# Patient Record
Sex: Female | Born: 1940
Health system: Southern US, Community
[De-identification: ages and names within clinical notes are randomized; demographics above are authoritative.]

## PROBLEM LIST (undated history)

## (undated) DIAGNOSIS — F32A Depression, unspecified: Secondary | ICD-10-CM

## (undated) DIAGNOSIS — E039 Hypothyroidism, unspecified: Secondary | ICD-10-CM

## (undated) DIAGNOSIS — E079 Disorder of thyroid, unspecified: Secondary | ICD-10-CM

## (undated) DIAGNOSIS — E785 Hyperlipidemia, unspecified: Secondary | ICD-10-CM

## (undated) DIAGNOSIS — R269 Unspecified abnormalities of gait and mobility: Secondary | ICD-10-CM

## (undated) DIAGNOSIS — N393 Stress incontinence (female) (male): Secondary | ICD-10-CM

## (undated) DIAGNOSIS — I519 Heart disease, unspecified: Secondary | ICD-10-CM

## (undated) DIAGNOSIS — M1610 Unilateral primary osteoarthritis, unspecified hip: Secondary | ICD-10-CM

## (undated) DIAGNOSIS — G43B0 Ophthalmoplegic migraine, not intractable: Secondary | ICD-10-CM

## (undated) DIAGNOSIS — I4891 Unspecified atrial fibrillation: Secondary | ICD-10-CM

## (undated) DIAGNOSIS — M1990 Unspecified osteoarthritis, unspecified site: Secondary | ICD-10-CM

## (undated) HISTORY — DX: Stress incontinence (female) (male): N39.3

## (undated) HISTORY — DX: Disorder of thyroid, unspecified: E07.9

## (undated) HISTORY — DX: Heart disease, unspecified: I51.9

## (undated) HISTORY — PX: BREAST BIOPSY: SHX20

## (undated) HISTORY — DX: Depression, unspecified: F32.A

## (undated) HISTORY — DX: Unilateral primary osteoarthritis, unspecified hip: M16.10

## (undated) HISTORY — DX: Unspecified abnormalities of gait and mobility: R26.9

## (undated) HISTORY — PX: HIP SURGERY: SHX245

## (undated) HISTORY — DX: Hyperlipidemia, unspecified: E78.5

## (undated) HISTORY — DX: Unspecified atrial fibrillation: I48.91

## (undated) HISTORY — PX: TONSILLECTOMY AND ADENOIDECTOMY: SHX28

## (undated) HISTORY — PX: BACK SURGERY: SHX140

## (undated) HISTORY — PX: KNEE SURGERY: SHX244

## (undated) HISTORY — DX: Hypothyroidism, unspecified: E03.9

## (undated) HISTORY — PX: OOPHORECTOMY: SHX86

## (undated) HISTORY — DX: Ophthalmoplegic migraine, not intractable: G43.B0

## (undated) HISTORY — DX: Unspecified osteoarthritis, unspecified site: M19.90

---

## 2020-12-05 ENCOUNTER — Other Ambulatory Visit: Payer: Self-pay

## 2020-12-05 ENCOUNTER — Ambulatory Visit (INDEPENDENT_AMBULATORY_CARE_PROVIDER_SITE_OTHER): Payer: Medicare Other | Admitting: Family Medicine

## 2020-12-05 ENCOUNTER — Encounter: Payer: Self-pay | Admitting: Family Medicine

## 2020-12-05 ENCOUNTER — Ambulatory Visit (INDEPENDENT_AMBULATORY_CARE_PROVIDER_SITE_OTHER): Payer: Medicare Other

## 2020-12-05 VITALS — BP 100/72 | HR 55 | Temp 97.4°F | Ht 65.0 in | Wt 193.0 lb

## 2020-12-05 DIAGNOSIS — N393 Stress incontinence (female) (male): Secondary | ICD-10-CM

## 2020-12-05 DIAGNOSIS — Z7901 Long term (current) use of anticoagulants: Secondary | ICD-10-CM

## 2020-12-05 DIAGNOSIS — F32A Depression, unspecified: Secondary | ICD-10-CM

## 2020-12-05 DIAGNOSIS — I4891 Unspecified atrial fibrillation: Secondary | ICD-10-CM

## 2020-12-05 DIAGNOSIS — Z7189 Other specified counseling: Secondary | ICD-10-CM

## 2020-12-05 DIAGNOSIS — R413 Other amnesia: Secondary | ICD-10-CM

## 2020-12-05 DIAGNOSIS — E785 Hyperlipidemia, unspecified: Secondary | ICD-10-CM

## 2020-12-05 DIAGNOSIS — R269 Unspecified abnormalities of gait and mobility: Secondary | ICD-10-CM

## 2020-12-05 DIAGNOSIS — E039 Hypothyroidism, unspecified: Secondary | ICD-10-CM

## 2020-12-05 LAB — POCT INR: INR: 1.6 — AB (ref 2.0–3.0)

## 2020-12-05 MED ORDER — ESTRING 2 MG VA RING: 2.0000 mg | VAGINAL_RING | VAGINAL | Status: DC

## 2020-12-05 NOTE — Patient Instructions (Addendum)
Go to the front and ask to get your INR checked.   I'll look back through your records and get your referrals set up.  We'll see about follow up blood tests. Take care.  Glad to see you.

## 2020-12-05 NOTE — Progress Notes (Signed)
This visit occurred during the SARS-CoV-2 public health emergency.  Safety protocols were in place, including screening questions prior to the visit, additional usage of staff PPE, and extensive cleaning of exam room while observing appropriate contact time as indicated for disinfecting solutions.  New patient.  Moved down from [place] last month, getting set up locally.   History of depression and she will to get set up with counseling locally.    History of A. fib after surgery.  It sounds like she had developed CHF there afterwards.  She never had a bypass or stent.  She is anticoagulated and needs INR done today.   She needs to get set up with cardiology locally.  Still on metoprolol at baseline.  Already on Lipitor and Lasix.  She had history of handwriting change and also memory changes after previous surgery.  She has had some headaches and also balance changes.  Per patient and family, she was "not the same" after back surgery in August 2020.  She is using a walker now.  She last fell in December 2021.  Her balance changes got worse after surgery but they did predate her surgery, though not at the current level.  She does not have a foot drop.  Daughter was asking about getting her tested for Lyme disease.  She is used Estring 4 times a year.  It helped initially but she still has stress urinary incontinence.  Hypothyroidism on replacement.  Will review old records.  Compliant with medication.  Daughter [name] designated if patient were incapacitated.  Meds, vitals, and allergies reviewed.   ROS: Per HPI unless specifically indicated in ROS section   GEN: nad, alert and oriented HEENT: ncat NECK: supple w/o LA CV: rrr.  PULM: ctab, no inc wob ABD: soft, +bs EXT: Trace BLE edema SKIN: no acute rash She has notably slow handwriting when signing her name at the office visit.  She acknowledges this.  45 minutes were devoted to patient care in this encounter (this includes time spent  reviewing the patient's file/history, interviewing and examining the patient, counseling/reviewing plan with patient).

## 2020-12-05 NOTE — Patient Instructions (Addendum)
Pre visit review using our clinic review tool, if applicable. No additional management support is needed unless otherwise documented below in the visit note.  Increase dose to take 4 mg today and tomorrow and then continue weekly dose of 2 mg everyday EXCEPT Sundays and Thursdays. Re-check in 2 weeks.

## 2020-12-07 NOTE — Progress Notes (Signed)
Agree. Thanks

## 2020-12-08 ENCOUNTER — Telehealth: Payer: Self-pay | Admitting: Family Medicine

## 2020-12-08 ENCOUNTER — Encounter: Payer: Self-pay | Admitting: Family Medicine

## 2020-12-08 DIAGNOSIS — E785 Hyperlipidemia, unspecified: Secondary | ICD-10-CM | POA: Insufficient documentation

## 2020-12-08 DIAGNOSIS — R32 Unspecified urinary incontinence: Secondary | ICD-10-CM | POA: Insufficient documentation

## 2020-12-08 DIAGNOSIS — R413 Other amnesia: Secondary | ICD-10-CM | POA: Insufficient documentation

## 2020-12-08 DIAGNOSIS — I4891 Unspecified atrial fibrillation: Secondary | ICD-10-CM | POA: Insufficient documentation

## 2020-12-08 DIAGNOSIS — N393 Stress incontinence (female) (male): Secondary | ICD-10-CM | POA: Insufficient documentation

## 2020-12-08 DIAGNOSIS — E039 Hypothyroidism, unspecified: Secondary | ICD-10-CM | POA: Insufficient documentation

## 2020-12-08 DIAGNOSIS — R269 Unspecified abnormalities of gait and mobility: Secondary | ICD-10-CM | POA: Insufficient documentation

## 2020-12-08 DIAGNOSIS — Z7189 Other specified counseling: Secondary | ICD-10-CM | POA: Insufficient documentation

## 2020-12-08 DIAGNOSIS — F32A Depression, unspecified: Secondary | ICD-10-CM | POA: Insufficient documentation

## 2020-12-08 MED ORDER — METOPROLOL SUCCINATE ER 50 MG PO TB24: 50.0000 mg | ORAL_TABLET | Freq: Every day | ORAL | Status: DC

## 2020-12-08 NOTE — Assessment & Plan Note (Signed)
I think it would likely make sense for the patient to see urology and I went ahead and put in the referral.

## 2020-12-08 NOTE — Assessment & Plan Note (Signed)
Noted to be worse after previous back surgery.  She does not have a focal weakness today.  She does not have a foot drop.  She does have a slowing of handwriting but not specific unilateral weakness.  I will review old records.

## 2020-12-08 NOTE — Assessment & Plan Note (Signed)
Refer for counseling.

## 2020-12-08 NOTE — Telephone Encounter (Signed)
She states that she also has a allergy to  Macrobid  The best anesthesia for her is dilaudid

## 2020-12-08 NOTE — Assessment & Plan Note (Signed)
Continue levothyroxine 

## 2020-12-08 NOTE — Assessment & Plan Note (Signed)
Unclear source.  I want to review outside records.  Daughter was asking about Lyme testing.  We opted to defer any further labs until I could review her old records.

## 2020-12-08 NOTE — Assessment & Plan Note (Signed)
Daughter [name] designated if patient were incapacitated. 

## 2020-12-08 NOTE — Assessment & Plan Note (Signed)
INR done today.  Continue Coumadin.  No change in meds at this point.  I will review outside records.  Refer to cardiology.

## 2020-12-08 NOTE — Assessment & Plan Note (Signed)
-  Continue Lipitor °

## 2020-12-10 NOTE — Telephone Encounter (Signed)
Chart updated. Thanks!

## 2020-12-16 DIAGNOSIS — H52223 Regular astigmatism, bilateral: Secondary | ICD-10-CM | POA: Diagnosis not present

## 2020-12-16 DIAGNOSIS — H2513 Age-related nuclear cataract, bilateral: Secondary | ICD-10-CM | POA: Diagnosis not present

## 2020-12-16 DIAGNOSIS — H04129 Dry eye syndrome of unspecified lacrimal gland: Secondary | ICD-10-CM | POA: Diagnosis not present

## 2020-12-16 DIAGNOSIS — H5203 Hypermetropia, bilateral: Secondary | ICD-10-CM | POA: Diagnosis not present

## 2020-12-18 ENCOUNTER — Ambulatory Visit (INDEPENDENT_AMBULATORY_CARE_PROVIDER_SITE_OTHER): Payer: Medicare Other

## 2020-12-18 ENCOUNTER — Other Ambulatory Visit: Payer: Self-pay

## 2020-12-18 ENCOUNTER — Telehealth: Payer: Self-pay | Admitting: Family Medicine

## 2020-12-18 DIAGNOSIS — Z7901 Long term (current) use of anticoagulants: Secondary | ICD-10-CM

## 2020-12-18 LAB — POCT INR: INR: 1.5 — AB (ref 2.0–3.0)

## 2020-12-18 NOTE — Telephone Encounter (Signed)
Patient came in drop off Power of [place] paper work . It was place in mail tower

## 2020-12-18 NOTE — Telephone Encounter (Signed)
Noted. Thanks.

## 2020-12-18 NOTE — Progress Notes (Signed)
Agree. Thanks

## 2020-12-18 NOTE — Patient Instructions (Addendum)
Pre visit review using our clinic review tool, if applicable. No additional management support is needed unless otherwise documented below in the visit note.   Increase dose to take  8 mg today and increase dose tomorrow to take 3 mg and then change weekly dose of 2 mg everyday EXCEPT take 4 mg on Mon, Wed and Fri.  Re-check in 1 weeks.

## 2020-12-23 ENCOUNTER — Other Ambulatory Visit: Payer: Self-pay

## 2020-12-23 ENCOUNTER — Encounter: Payer: Self-pay | Admitting: Interventional Cardiology

## 2020-12-23 ENCOUNTER — Ambulatory Visit: Payer: Medicare Other | Admitting: Interventional Cardiology

## 2020-12-23 VITALS — BP 136/80 | HR 44 | Ht 65.0 in | Wt 194.0 lb

## 2020-12-23 DIAGNOSIS — I480 Paroxysmal atrial fibrillation: Secondary | ICD-10-CM

## 2020-12-23 DIAGNOSIS — I5032 Chronic diastolic (congestive) heart failure: Secondary | ICD-10-CM | POA: Diagnosis not present

## 2020-12-23 DIAGNOSIS — Z7901 Long term (current) use of anticoagulants: Secondary | ICD-10-CM

## 2020-12-23 DIAGNOSIS — R001 Bradycardia, unspecified: Secondary | ICD-10-CM

## 2020-12-23 MED ORDER — FUROSEMIDE 20 MG PO TABS: 20.0000 mg | ORAL_TABLET | Freq: Every day | ORAL | 2 refills | Status: DC

## 2020-12-23 MED ORDER — METOPROLOL SUCCINATE ER 25 MG PO TB24: 25.0000 mg | ORAL_TABLET | Freq: Every day | ORAL | 2 refills | Status: DC

## 2020-12-23 NOTE — Patient Instructions (Signed)
Medication Instructions:  Your physician has recommended you make the following change in your medication: Decrease metoprolol succinate to 25 mg by mouth daily  *If you need a refill on your cardiac medications before your next appointment, please call your pharmacy*   Lab Work: none If you have labs (blood work) drawn today and your tests are completely normal, you will receive your results only by: Marland Kitchen MyChart Message (if you have MyChart) OR . A paper copy in the mail If you have any lab test that is abnormal or we need to change your treatment, we will call you to review the results.   Testing/Procedures: none   Follow-Up: At [facility], you and your health needs are our priority.  As part of our continuing mission to provide you with exceptional heart care, we have created designated Provider Care Teams.  These Care Teams include your primary Cardiologist (physician) and Advanced Practice Providers (APPs -  Physician Assistants and Nurse Practitioners) who all work together to provide you with the care you need, when you need it.  We recommend signing up for the patient portal called "MyChart".  Sign up information is provided on this After Visit Summary.  MyChart is used to connect with patients for Virtual Visits (Telemedicine).  Patients are able to view lab/test results, encounter notes, upcoming appointments, etc.  Non-urgent messages can be sent to your provider as well.   To learn more about what you can do with MyChart, go to [email or web address].    Your next appointment:   6 month(s)  The format for your next appointment:   In Person  Provider:   You may see Lance Muss, MD or one of the following Advanced Practice Providers on your designated Care Team:    Ronie Spies, PA-C  Jacolyn Reedy, PA-C    Other Instructions

## 2020-12-23 NOTE — Progress Notes (Signed)
Cardiology Office Note   Date:  12/23/2020   ID:  [name], [name] [birth year], MRN [id number]  PCP:  Mallory Nam, MD    No chief complaint on file.  AFib  Wt Readings from Last 3 Encounters:  12/23/20 194 lb (88 kg)  12/05/20 193 lb (87.5 kg)       History of Present Illness: Mallory Estrada is a 80 y.o. female who is being seen today for the evaluation of AFib at the request of Mallory Nam, MD.  Per Dr. Para Estrada: "New patient.  Moved down from [place] in Jan 2022, getting set up locally.   History of depression and she will to get set up with counseling locally.    History of A. fib after surgery.  It sounds like she had developed CHF there afterwards.  She never had a bypass or stent.  She is anticoagulated and needs INR done today.   She needs to get set up with cardiology locally.  Still on metoprolol at baseline.  Already on Lipitor and Lasix."  She reports her memory has decreased since the back surgery in 2020.   AFib started after spinal surgery in 06/2019.  She has been on Coumadin since that time.  It had been stable until her move.  She is followed by Dr. Para Estrada.   No bleeding problems. She did not feel sx with AFib in the past.   Denies : Chest pain. Dizziness. Leg edema. Nitroglycerin use. Orthopnea. Palpitations. Paroxysmal nocturnal dyspnea. Shortness of breath. Syncope.   Past Medical History:  Diagnosis Date  . Arthritis   . Atrial fibrillation (HCC)    Noted postop.  . Depression   . Gait abnormality   . Hyperlipidemia   . Hypothyroidism   . Ophthalmoplegic migraine   . SUI (stress urinary incontinence, female)     Past Surgical History:  Procedure Laterality Date  . BACK SURGERY    . BREAST BIOPSY    . HIP SURGERY Bilateral   . KNEE SURGERY Bilateral   . OOPHORECTOMY Left   . TONSILLECTOMY AND ADENOIDECTOMY       Current Outpatient Medications  Medication Sig Dispense Refill  . atorvastatin (LIPITOR) 10 MG tablet Take 10 mg  by mouth daily.    . Cholecalciferol (VITAMIN D3) 125 MCG (5000 UT) CAPS Take by mouth.    . estradiol (ESTRING) 2 MG vaginal ring Place 2 mg vaginally every 3 (three) months. follow package directions    . furosemide (LASIX) 20 MG tablet Take 20 mg by mouth.    . levothyroxine (SYNTHROID) 50 MCG tablet Take 50 mcg by mouth daily before breakfast.    . magnesium gluconate (MAGONATE) 500 MG tablet Take 500 mg by mouth 2 (two) times daily.    . Melatonin 1 MG CHEW Chew by mouth.    . metoprolol succinate (TOPROL-XL) 50 MG 24 hr tablet Take 1 tablet (50 mg total) by mouth daily. Take with or immediately following a meal.    . potassium chloride (KLOR-CON) 20 MEQ packet Take 20 mEq by mouth daily.     No current facility-administered medications for this visit.    Allergies:   Fentanyl, Ketamine, Macrobid [nitrofurantoin], and Sulfa antibiotics    Social History:  The patient  reports that she quit smoking about 50 years ago. She has never used smokeless tobacco. She reports current alcohol use.   Family History:  The patient's family history includes Arthritis in her father, mother, and sister; Breast  cancer in her maternal aunt; Colon cancer in her brother; Diabetes in her father; Heart attack in her brother; Heart disease in her brother and father.    ROS:  Please see the history of present illness.   Otherwise, review of systems are positive for memory decline.   All other systems are reviewed and negative.    PHYSICAL EXAM: VS:  BP 136/80   Pulse (!) 44   Ht 5\' 5"  (1.651 m)   Wt 194 lb (88 kg)   SpO2 95%   BMI 32.28 kg/m  , BMI Body mass index is 32.28 kg/m. GEN: Well nourished, well developed, in no acute distress  HEENT: normal  Neck: no JVD, carotid bruits, or masses Cardiac: bradycardia; no murmurs, rubs, or gallops,no edema  Respiratory:  clear to auscultation bilaterally, normal work of breathing GI: soft, nontender, nondistended, + BS MS: no deformity or atrophy  Skin:  warm and dry, no rash Neuro:  Strength and sensation are intact Psych: euthymic mood, full affect   EKG:   The ekg ordered today demonstrates marked sinus bradycardia   Recent Labs: No results found for requested labs within last 8760 hours.   Lipid Panel No results found for: CHOL, TRIG, HDL, CHOLHDL, VLDL, LDLCALC, LDLDIRECT   Other studies Reviewed: Additional studies/ records that were reviewed today with results demonstrating: records from Dr. reviewed   ASSESSMENT AND PLAN:  1. PAF: In Sinus bradycardia. Did not feel her AFib in the past.  2. Anticoagulated: Coumadin for stroke prevention.  3. Bradycardia: Decrease metoprolol to 25 mg daily.  Will see if HR and BP can be checked at Coumadin visit.  4. Mallory Estrada diastolic heart failure which has been managed with Lasix. She had an echo in [id number]. Appears euvolemic.  Await records from [place].   Current medicines are reviewed at length with the patient today.  The patient concerns regarding her medicines were addressed.  The following changes have been made:  No change  Labs/ tests ordered today include:  No orders of the defined types were placed in this encounter.   Recommend 150 minutes/week of aerobic exercise Low fat, low carb, high fiber diet recommended  Disposition:   FU in 6 months   Signed, [place], MD  12/23/2020 10:58 AM    [facility] [address] [place], [place], [place]  [place] Phone: [phone]; Fax: [phone]

## 2020-12-25 ENCOUNTER — Telehealth: Payer: Self-pay | Admitting: Interventional Cardiology

## 2020-12-25 ENCOUNTER — Ambulatory Visit: Payer: Medicare Other

## 2020-12-25 ENCOUNTER — Telehealth: Payer: Self-pay

## 2020-12-25 NOTE — Telephone Encounter (Signed)
Patients daughter called and stated that they were unable to make it to coumadin clinic appointment today because patient had a fall last night. Patients daughter states that her mother has no injuries, but reports that her mother is "tired and shaken up." Patients daughter states that it is becoming more difficult for her mother to make it to doctors appointment because she has so many. Daughter stated that she had been researching home INR checks. Patients daughter is interested in possibly starting to monitor her mothers INR at home.

## 2020-12-25 NOTE — Telephone Encounter (Signed)
Sent record request to Dr. Lonna Cobb at [facility] on 12/24/20. Received records on 12/25/20 and placed them in Dr. Hoyle Barr [facility] 12/25/20

## 2020-12-26 NOTE — Telephone Encounter (Signed)
Pts daughter called and advised pt's insurance will pay for INR machine and pt does meet parameters for them to pay for it. Advised an order would be faxed and if they were not contacted within 2 weeks by the company to contact this office. [name] verbalized understanding.

## 2020-12-26 NOTE — Telephone Encounter (Signed)
Please talk to me about this situation and potential INR monitoring at home.

## 2020-12-26 NOTE — Telephone Encounter (Signed)
Called and spoke with patients daughter in regards to INR testing at home. Informed patients daughter that the patient or a family member would have to be the one checking it. Patients daughter verbalized understanding. Instructed patients daughter to call insurance company to see if they would cover the INR monitor. Also, INR check scheduled for this Tuesday (3/1) since patient missed last INR check. Instructed patients daughter to continue current dosing to take 1 tablet everyday except take 2 tablets on Monday, Wednesday and Thursday. Patients daughter verbalized understanding and stated that she would call the insurance company to find out information regarding home INR monitor.

## 2020-12-29 ENCOUNTER — Encounter: Payer: Self-pay | Admitting: Family Medicine

## 2020-12-29 DIAGNOSIS — Z531 Procedure and treatment not carried out because of patient's decision for reasons of belief and group pressure: Secondary | ICD-10-CM

## 2020-12-29 DIAGNOSIS — IMO0001 Reserved for inherently not codable concepts without codable children: Secondary | ICD-10-CM

## 2020-12-29 HISTORY — DX: Reserved for inherently not codable concepts without codable children: IMO0001

## 2020-12-29 HISTORY — DX: Procedure and treatment not carried out because of patient's decision for reasons of belief and group pressure: Z53.1

## 2020-12-29 NOTE — Telephone Encounter (Signed)
Order is being filled out by [employer], Charity fundraiser

## 2020-12-29 NOTE — Telephone Encounter (Signed)
Do I need to send an order for the INR meter?  Please advise patient that I am awaiting the rest of the outside records and I'll work on that as they come in.  We can consider Lyme testing/memory work up at that point.  Thanks.

## 2020-12-30 ENCOUNTER — Other Ambulatory Visit: Payer: Self-pay

## 2020-12-30 ENCOUNTER — Telehealth: Payer: Self-pay | Admitting: Family Medicine

## 2020-12-30 ENCOUNTER — Ambulatory Visit (INDEPENDENT_AMBULATORY_CARE_PROVIDER_SITE_OTHER): Payer: Medicare Other

## 2020-12-30 DIAGNOSIS — Z7901 Long term (current) use of anticoagulants: Secondary | ICD-10-CM | POA: Diagnosis not present

## 2020-12-30 DIAGNOSIS — R413 Other amnesia: Secondary | ICD-10-CM

## 2020-12-30 DIAGNOSIS — I480 Paroxysmal atrial fibrillation: Secondary | ICD-10-CM

## 2020-12-30 DIAGNOSIS — E039 Hypothyroidism, unspecified: Secondary | ICD-10-CM

## 2020-12-30 LAB — POCT INR: INR: 1.3 — AB (ref 2.0–3.0)

## 2020-12-30 NOTE — Telephone Encounter (Signed)
Thanks

## 2020-12-30 NOTE — Patient Instructions (Addendum)
Pre visit review using our clinic review tool, if applicable. No additional management support is needed unless otherwise documented below in the visit note.  Increase dose to take  4 mg today and increase dose and then change weekly dose of 2 mg everyday EXCEPT take 4 mg on Sun, Mon, Wed and Fri.  Re-check in 2 weeks.

## 2020-12-30 NOTE — Telephone Encounter (Signed)
[name] (DPR signed) left v/m that pt has coumadin clinic appt at [facility] 12/30/20 at 2:45. [name] said pt either needs new atorvastatin rx [facility] [address] or does pt need to have labs done while at office on 12/30/20. Pt established as new pt with DR Para March on 12/05/20 and just received pts records on 12/29/20 per pts chart review tab. [name] had in v/m if no cb before coming to office pt would ask at coumadin visit. Sending note to DR Para March and Ileene Rubens RN

## 2020-12-30 NOTE — Telephone Encounter (Signed)
I spoke with [name] and advised per Dr Para March that he would work on the atorvastatin refill to [facility] and he just received the records for Mrs Torosyan and he will review them and then determine what labs might be needed at the next INR appt. [name] voiced understanding and said that was fine and she was appreciative.

## 2020-12-30 NOTE — Telephone Encounter (Signed)
Received records from [facility], Forwarded to Dr. Crawford Givens via [facility] 12/30/20

## 2020-12-31 MED ORDER — ATORVASTATIN CALCIUM 10 MG PO TABS: 10.0000 mg | ORAL_TABLET | Freq: Every day | ORAL | 3 refills | Status: DC

## 2020-12-31 NOTE — Progress Notes (Signed)
Agree. Thanks

## 2020-12-31 NOTE — Telephone Encounter (Signed)
Sent. Thanks.   

## 2021-01-01 NOTE — Telephone Encounter (Signed)
Order faxed for INR machine to [employer]. Called and updated patients daughter that the order had been faxed and that they should be receiving a call from company within the next two weeks. If not, instructed patients daughter to call back and let us know so that we can follow up. Patients daughter verbalized understanding.

## 2021-01-03 NOTE — Addendum Note (Signed)
Addended by: Joaquim Nam on: 01/03/2021 01:47 PM   Modules accepted: Orders

## 2021-01-03 NOTE — Telephone Encounter (Addendum)
Notify patient/family.  I got the outside records.  I sent those for scanning.  I put in the follow-up labs.  All the orders are in.  They should be able to get those done when she has her next INR visit.  We will get those resulted and go from there.  Daughter was asking about getting her tested for Lyme disease.  I put that in.  Thanks.

## 2021-01-05 NOTE — Telephone Encounter (Signed)
Called and spoke to daughter. Gave all information will get labs done at INR visit.

## 2021-01-13 ENCOUNTER — Other Ambulatory Visit (INDEPENDENT_AMBULATORY_CARE_PROVIDER_SITE_OTHER): Payer: Medicare Other

## 2021-01-13 ENCOUNTER — Other Ambulatory Visit: Payer: Self-pay

## 2021-01-13 ENCOUNTER — Ambulatory Visit (INDEPENDENT_AMBULATORY_CARE_PROVIDER_SITE_OTHER): Payer: Medicare Other

## 2021-01-13 DIAGNOSIS — R413 Other amnesia: Secondary | ICD-10-CM | POA: Diagnosis not present

## 2021-01-13 DIAGNOSIS — I480 Paroxysmal atrial fibrillation: Secondary | ICD-10-CM

## 2021-01-13 DIAGNOSIS — E039 Hypothyroidism, unspecified: Secondary | ICD-10-CM

## 2021-01-13 DIAGNOSIS — Z7901 Long term (current) use of anticoagulants: Secondary | ICD-10-CM

## 2021-01-13 LAB — POCT INR: INR: 1.5 — AB (ref 2.0–3.0)

## 2021-01-13 NOTE — Addendum Note (Signed)
Addended by: Cordelia Bessinger P on: 01/13/2021 03:32 PM   Modules accepted: Orders  

## 2021-01-13 NOTE — Addendum Note (Signed)
Addended by: Dayveon Halley P on: 01/13/2021 03:32 PM   Modules accepted: Orders  

## 2021-01-13 NOTE — Addendum Note (Signed)
Addended by: Eual Fines on: 01/13/2021 03:32 PM   Modules accepted: Orders

## 2021-01-13 NOTE — Addendum Note (Signed)
Addended by: Jeris Easterly P on: 01/13/2021 03:32 PM   Modules accepted: Orders  

## 2021-01-13 NOTE — Addendum Note (Signed)
Addended by: Lorena Clearman P on: 01/13/2021 03:32 PM   Modules accepted: Orders  

## 2021-01-13 NOTE — Addendum Note (Signed)
Addended by: Achol Azpeitia P on: 01/13/2021 03:32 PM   Modules accepted: Orders  

## 2021-01-13 NOTE — Patient Instructions (Signed)
Pre visit review using our clinic review tool, if applicable. No additional management support is needed unless otherwise documented below in the visit note.  Increase dose to take  4 mg today and increase dose and then change weekly dose of 4 mg everyday EXCEPT take 2 mg Wed.  Re-check in 2 weeks.

## 2021-01-14 LAB — LYME AB/WESTERN BLOT REFLEX
LYME DISEASE AB, QUANT, IGM: <0.8 index (ref 0.00–0.79)
Lyme IgG/IgM Ab: <0.91 {ISR} (ref 0.00–0.90)

## 2021-01-14 LAB — CBC WITH DIFFERENTIAL/PLATELET
Basophils Absolute: 0.1 10*3/uL (ref 0.0–0.1)
Basophils Relative: 1.2 % (ref 0.0–3.0)
Eosinophils Absolute: 0.1 10*3/uL (ref 0.0–0.7)
Eosinophils Relative: 3 % (ref 0.0–5.0)
HCT: 37.5 % (ref 36.0–46.0)
Hemoglobin: 12.8 g/dL (ref 12.0–15.0)
Lymphocytes Relative: 28.7 % (ref 12.0–46.0)
Lymphs Abs: 1.3 10*3/uL (ref 0.7–4.0)
MCHC: 34.2 g/dL (ref 30.0–36.0)
MCV: 90.8 fl (ref 78.0–100.0)
Monocytes Absolute: 0.4 10*3/uL (ref 0.1–1.0)
Monocytes Relative: 8 % (ref 3.0–12.0)
Neutro Abs: 2.7 10*3/uL (ref 1.4–7.7)
Neutrophils Relative %: 59.1 % (ref 43.0–77.0)
Platelets: 185 10*3/uL (ref 150.0–400.0)
RBC: 4.13 Mil/uL (ref 3.87–5.11)
RDW: 14.8 % (ref 11.5–15.5)
WBC: 4.6 10*3/uL (ref 4.0–10.5)

## 2021-01-14 LAB — COMPREHENSIVE METABOLIC PANEL
ALT: 8 U/L (ref 0–35)
AST: 17 U/L (ref 0–37)
Albumin: 4.2 g/dL (ref 3.5–5.2)
Alkaline Phosphatase: 55 U/L (ref 39–117)
BUN: 39 mg/dL — ABNORMAL HIGH (ref 6–23)
CO2: 25 mEq/L (ref 19–32)
Calcium: 9.8 mg/dL (ref 8.4–10.5)
Chloride: 106 mEq/L (ref 96–112)
Creatinine, Ser: 1.27 mg/dL — ABNORMAL HIGH (ref 0.40–1.20)
GFR: 40.39 mL/min — ABNORMAL LOW (ref >60.00)
Glucose, Bld: 97 mg/dL (ref 70–99)
Potassium: 4.3 mEq/L (ref 3.5–5.1)
Sodium: 142 mEq/L (ref 135–145)
Total Bilirubin: 0.5 mg/dL (ref 0.2–1.2)
Total Protein: 6.9 g/dL (ref 6.0–8.3)

## 2021-01-14 LAB — LIPID PANEL
Cholesterol: 143 mg/dL (ref 0–200)
HDL: 49 mg/dL (ref >39.00)
LDL Cholesterol: 71 mg/dL (ref 0–99)
NonHDL: 93.5
Total CHOL/HDL Ratio: 3
Triglycerides: 114 mg/dL (ref 0.0–149.0)
VLDL: 22.8 mg/dL (ref 0.0–40.0)

## 2021-01-14 LAB — TSH: TSH: 2.55 u[IU]/mL (ref 0.35–4.50)

## 2021-01-14 LAB — VITAMIN D 25 HYDROXY (VIT D DEFICIENCY, FRACTURES): VITD: 64.39 ng/mL (ref 30.00–100.00)

## 2021-01-14 LAB — VITAMIN B12: Vitamin B-12: 1002 pg/mL — ABNORMAL HIGH (ref 211–911)

## 2021-01-14 NOTE — Progress Notes (Signed)
Agree. Thanks

## 2021-01-16 DIAGNOSIS — R351 Nocturia: Secondary | ICD-10-CM | POA: Diagnosis not present

## 2021-01-16 DIAGNOSIS — R350 Frequency of micturition: Secondary | ICD-10-CM | POA: Diagnosis not present

## 2021-01-16 DIAGNOSIS — N3946 Mixed incontinence: Secondary | ICD-10-CM | POA: Diagnosis not present

## 2021-01-19 ENCOUNTER — Telehealth: Payer: Self-pay

## 2021-01-19 NOTE — Telephone Encounter (Signed)
Received VM from [place], pt's daughter, reporting something but the entire message was incomprehensible due to phone breaking up. Will need to contact [name] to find out why she called.

## 2021-01-20 DIAGNOSIS — H2513 Age-related nuclear cataract, bilateral: Secondary | ICD-10-CM | POA: Diagnosis not present

## 2021-01-20 DIAGNOSIS — Z135 Encounter for screening for eye and ear disorders: Secondary | ICD-10-CM | POA: Diagnosis not present

## 2021-01-20 DIAGNOSIS — H04129 Dry eye syndrome of unspecified lacrimal gland: Secondary | ICD-10-CM | POA: Diagnosis not present

## 2021-01-20 NOTE — Telephone Encounter (Signed)
Called and LVM for patients daughter to call back.

## 2021-01-21 NOTE — Telephone Encounter (Signed)
Patients daughter called and stated that her mothers insurance does not cover the INR machine through [employer], but it would be covered with [employer].

## 2021-01-22 NOTE — Telephone Encounter (Signed)
Talked to [facility] with [employer], the company with the INR machines that the order was sent to, and he verified that [employer] is out of network for this pt. Will need to check if coumadin clinic nurse, Arline Asp, knows of other companies or if it should be sent to [employer]. Ileene Rubens, RN will f/u with Arline Asp

## 2021-01-23 NOTE — Telephone Encounter (Signed)
Called and spoke with patients daughter and gave her two companies recommended by Arline Asp, Coumadin RN. One was [facility] and the other was [facility]. Patients daughter stated that she would call the insurance company to see if either of these are covered and will let us know.

## 2021-01-27 ENCOUNTER — Ambulatory Visit (INDEPENDENT_AMBULATORY_CARE_PROVIDER_SITE_OTHER): Payer: Medicare Other

## 2021-01-27 ENCOUNTER — Other Ambulatory Visit: Payer: Self-pay

## 2021-01-27 DIAGNOSIS — Z7901 Long term (current) use of anticoagulants: Secondary | ICD-10-CM | POA: Diagnosis not present

## 2021-01-27 LAB — POCT INR: INR: 2.8 (ref 2.0–3.0)

## 2021-01-27 NOTE — Patient Instructions (Addendum)
Pre visit review using our clinic review tool, if applicable. No additional management support is needed unless otherwise documented below in the visit note.  Continue 4 mg everyday EXCEPT take 2 mg on Wed.  Re-check in 4 weeks.

## 2021-01-28 NOTE — Progress Notes (Signed)
Agree. Thanks

## 2021-01-30 NOTE — Telephone Encounter (Signed)
Received VM from [place], pt's daughter, reporting she has talked to pts insurance and they said pt is in network with [employer] and [employer]. Contacted Eusebio Me, sales rep, at [place], [phone], and inquired how an order needs to be placed for INR machine. He is going to stop by on Monday and drop off some order forms.   Contacted [name] and inquired if it would be ok to place order  with [employer]. [name] agreed. Advised if she was not contacted in the next couple of weeks to contact this office. [name] verbalize understanding.

## 2021-02-03 NOTE — Telephone Encounter (Signed)
Order form filled out and placed on Dr. Lianne Bushy desk for signature.

## 2021-02-04 NOTE — Telephone Encounter (Signed)
Form signed. Thanks!

## 2021-02-04 NOTE — Telephone Encounter (Signed)
Called and spoke with patients daughter to inform her that I had faxed the paperwork for [facility]. Patients daughter verbalized understanding.

## 2021-02-06 ENCOUNTER — Telehealth: Payer: Self-pay

## 2021-02-06 NOTE — Telephone Encounter (Signed)
Patients daughter called in and stated that her mother needed a refill on cyclobenzaprine (which was a historical med, but not listed on medication list) and potassium chloride 20 mEq. Patient currently uses [facility] as her pharmacy. Please advise.

## 2021-02-08 MED ORDER — POTASSIUM CHLORIDE 20 MEQ PO PACK: 20.0000 meq | PACK | Freq: Every day | ORAL | 3 refills | Status: DC

## 2021-02-08 MED ORDER — CYCLOBENZAPRINE HCL 5 MG PO TABS
5.0000 mg | ORAL_TABLET | Freq: Three times a day (TID) | ORAL | 1 refills | Status: DC | PRN
Start: 2021-02-08 — End: 2021-04-15

## 2021-02-08 NOTE — Telephone Encounter (Signed)
Sent. Thanks.   

## 2021-02-08 NOTE — Addendum Note (Signed)
Addended by: Joaquim Nam on: 02/08/2021 09:17 PM   Modules accepted: Orders

## 2021-02-09 ENCOUNTER — Telehealth: Payer: Self-pay

## 2021-02-09 MED ORDER — POTASSIUM CHLORIDE ER 20 MEQ PO TBCR: 1.0000 | EXTENDED_RELEASE_TABLET | Freq: Every day | ORAL | 3 refills | Status: DC

## 2021-02-09 NOTE — Addendum Note (Signed)
Addended by: Joaquim Nam on: 02/09/2021 04:54 PM   Modules accepted: Orders

## 2021-02-09 NOTE — Telephone Encounter (Signed)
Patients daughter called and wanted to see about getting a HH Aide to come in and help with the patients showers twice a week. Patients daughter stated that she has talked with her mothers insurance company and they will cover this. Please advise.

## 2021-02-09 NOTE — Telephone Encounter (Signed)
Changed and sent. Thanks!

## 2021-02-09 NOTE — Telephone Encounter (Signed)
Patients daughter called and wanted to know if it was possible if we could change the potassium from a powder packet to the pill? Patients daughter stated that patient is able to handle the pill better. Please advise. Thanks.

## 2021-02-09 NOTE — Telephone Encounter (Signed)
I think she'll need face to face visit re: mobility/gait and then we'll go from there.  That is the only way I know to get it set up.  Please schedule when possible.  Thanks.

## 2021-02-10 NOTE — Telephone Encounter (Signed)
Spoke with patients daughter and she is aware OV is needed; set up for 02/24/21 at 2:30 pm.

## 2021-02-11 ENCOUNTER — Ambulatory Visit (INDEPENDENT_AMBULATORY_CARE_PROVIDER_SITE_OTHER): Payer: Medicare Other | Admitting: Psychologist

## 2021-02-11 DIAGNOSIS — F320 Major depressive disorder, single episode, mild: Secondary | ICD-10-CM

## 2021-02-23 ENCOUNTER — Encounter: Payer: Self-pay | Admitting: Radiology

## 2021-02-24 ENCOUNTER — Ambulatory Visit (INDEPENDENT_AMBULATORY_CARE_PROVIDER_SITE_OTHER): Payer: Medicare Other

## 2021-02-24 ENCOUNTER — Encounter: Payer: Self-pay | Admitting: Family Medicine

## 2021-02-24 ENCOUNTER — Ambulatory Visit (INDEPENDENT_AMBULATORY_CARE_PROVIDER_SITE_OTHER): Payer: Medicare Other | Admitting: Family Medicine

## 2021-02-24 ENCOUNTER — Other Ambulatory Visit: Payer: Self-pay

## 2021-02-24 VITALS — BP 114/72 | HR 50 | Temp 98.5°F | Ht 65.0 in | Wt 191.6 lb

## 2021-02-24 DIAGNOSIS — R300 Dysuria: Secondary | ICD-10-CM | POA: Diagnosis not present

## 2021-02-24 DIAGNOSIS — M1610 Unilateral primary osteoarthritis, unspecified hip: Secondary | ICD-10-CM

## 2021-02-24 DIAGNOSIS — R269 Unspecified abnormalities of gait and mobility: Secondary | ICD-10-CM | POA: Diagnosis not present

## 2021-02-24 DIAGNOSIS — Z7901 Long term (current) use of anticoagulants: Secondary | ICD-10-CM

## 2021-02-24 LAB — POC URINALSYSI DIPSTICK (AUTOMATED)
Bilirubin, UA: NEGATIVE
Glucose, UA: NEGATIVE
Ketones, UA: NEGATIVE
Nitrite, UA: POSITIVE
Protein, UA: NEGATIVE
Spec Grav, UA: 1.02 (ref 1.010–1.025)
Urobilinogen, UA: 0.2 E.U./dL
pH, UA: 5.5 (ref 5.0–8.0)

## 2021-02-24 LAB — POCT INR: INR: 1.9 — AB (ref 2.0–3.0)

## 2021-02-24 MED ORDER — WARFARIN SODIUM 2 MG PO TABS: ORAL_TABLET | ORAL | Status: DC

## 2021-02-24 NOTE — Progress Notes (Signed)
This visit occurred during the SARS-CoV-2 public health emergency.  Safety protocols were in place, including screening questions prior to the visit, additional usage of staff PPE, and extensive cleaning of exam room while observing appropriate contact time as indicated for disinfecting solutions.  Dysuria: yes, urgency but less per void.   duration of symptoms: "going on for a while", ie weeks abdominal pain: not now but some cramping yesterday AM.   Fevers: no back pain: some back pain, ongoing.  Has been using a heating pad and that helped Vomiting:no U/a d/w pt.    She has L>R hip pain with prev replacements done.  She has pain on left hip internal rotation.  Hip pain affects her gait and balance.  She needs supervision with a shower.  D/w pt about HH aide, specifically given fall risk in the shower.  Using walker at baseline.  HH aide for showers and PT/SW would be useful.    She had some BLE edema- unclear if from UTI.  She cut back on salt previously.  She was asking about potentially repeating a colonoscopy but it may be counterproductive at this point given her age, her comorbidities, and anticoagulation.  My concern is that if she went through with colonoscopy any anesthesia may worsen her memory.  We agreed that it was reasonable to defer for now.  Meds, vitals, and allergies reviewed.  Per HPI unless specifically indicated in ROS section   GEN: nad, alert and oriented HEENT: ncat NECK: supple CV: rrr.  PULM: ctab, no inc wob ABD: soft, +bs, suprapubic area not tender EXT: 1+ BLE edema.   SKIN: no acute rash BACK: no CVA pain  L hip sore on int rotation.

## 2021-02-24 NOTE — Patient Instructions (Signed)
We'll contact you about your urine labs.  I'll see about HH PT/aide/SW.  Take care.  Glad to see you.

## 2021-02-24 NOTE — Patient Instructions (Addendum)
Pre visit review using our clinic review tool, if applicable. No additional management support is needed unless otherwise documented below in the visit note.  Increase dose to take 8 mg (4 tablets) and then continue 4 mg everyday EXCEPT take 2 mg on Wed.  Re-check in 3 weeks.

## 2021-02-25 DIAGNOSIS — R300 Dysuria: Secondary | ICD-10-CM | POA: Insufficient documentation

## 2021-02-25 NOTE — Assessment & Plan Note (Signed)
See notes on urine culture.  Urinalysis done at office visit.  I would prefer to hold on treatment until we get her urine culture back, especially given her anticoagulation.  She agreed with plan.  Still okay for outpatient follow-up.  33 minutes were devoted to patient care in this encounter (this includes time spent reviewing the patient's file/history, interviewing and examining the patient, counseling/reviewing plan with patient).

## 2021-02-25 NOTE — Assessment & Plan Note (Signed)
Reasonable to get set up for home health PT.  It would be beneficial to get social work involved along with home health aide if possible.  See orders.

## 2021-02-25 NOTE — Progress Notes (Signed)
Agree. Thanks

## 2021-02-26 ENCOUNTER — Other Ambulatory Visit: Payer: Self-pay

## 2021-02-26 ENCOUNTER — Encounter: Payer: Self-pay | Admitting: Family Medicine

## 2021-02-26 ENCOUNTER — Other Ambulatory Visit: Payer: Self-pay | Admitting: Family Medicine

## 2021-02-26 ENCOUNTER — Ambulatory Visit: Payer: Medicare Other | Admitting: Psychologist

## 2021-02-26 ENCOUNTER — Ambulatory Visit: Payer: Medicare Other | Admitting: Family Medicine

## 2021-02-26 VITALS — BP 137/75 | HR 48 | Wt 192.0 lb

## 2021-02-26 DIAGNOSIS — Z1231 Encounter for screening mammogram for malignant neoplasm of breast: Secondary | ICD-10-CM | POA: Diagnosis not present

## 2021-02-26 DIAGNOSIS — N393 Stress incontinence (female) (male): Secondary | ICD-10-CM

## 2021-02-26 DIAGNOSIS — N3941 Urge incontinence: Secondary | ICD-10-CM

## 2021-02-26 LAB — URINE CULTURE
MICRO NUMBER:: 11815256
SPECIMEN QUALITY:: ADEQUATE

## 2021-02-26 MED ORDER — CEPHALEXIN 500 MG PO CAPS: 500.0000 mg | ORAL_CAPSULE | Freq: Two times a day (BID) | ORAL | 0 refills | Status: DC

## 2021-02-26 NOTE — Progress Notes (Signed)
   Subjective:    Patient ID: Mallory Estrada is a 80 y.o. female presenting with No chief complaint on file.  on 02/26/2021  HPI: On estring for urinary incontinence. Not on any progesterone. Seems to help with some of it. Still having some incontinence. Reports prior formal urodynamics and having been on a different medicine prior but most meds have been cost prohibitive. Has found estring to be working. Last placed in 11.2021. Now out. Recent UTI. Denies stress symptoms and more like urge. Is on diuretic. Using depends daily  Review of Systems  Constitutional: Negative for chills and fever.  Respiratory: Negative for shortness of breath.   Cardiovascular: Negative for chest pain.  Gastrointestinal: Negative for abdominal pain, nausea and vomiting.  Genitourinary: Negative for dysuria.  Skin: Negative for rash.      Objective:    BP 137/75   Pulse (!) 48   Wt 192 lb (87.1 kg)   BMI 31.95 kg/m  Physical Exam Constitutional:      General: She is not in acute distress.    Appearance: She is well-developed.  HENT:     Head: Normocephalic and atraumatic.  Eyes:     General: No scleral icterus. Cardiovascular:     Rate and Rhythm: Normal rate.  Pulmonary:     Effort: Pulmonary effort is normal.  Abdominal:     Palpations: Abdomen is soft.  Musculoskeletal:     Cervical back: Neck supple.  Skin:    General: Skin is warm and dry.  Neurological:     Mental Status: She is alert and oriented to person, place, and time.         Assessment & Plan:   Problem List Items Addressed This Visit      Unprioritized   Urinary incontinence - Primary    Refer to uro/Gyn, aware of maternity leave-- Apply for medication assistance with [employer] Refill Estring until further eval Conseder progesterone.       Other Visit Diagnoses    Encounter for screening mammogram for malignant neoplasm of breast       Relevant Orders   MM 3D SCREEN BREAST BILATERAL      Total time in review  of prior notes, pathology, labs, history taking, review with patient, exam, note writing, discussion of options, plan for next steps, alternatives, discussion of progesterone use with colleagues in setting of Estring use, and risks of treatment: 29 minutes.  Return in about 4 weeks (around 03/26/2021).  Reva Bores 03/05/2021 5:17 PM

## 2021-02-26 NOTE — Assessment & Plan Note (Signed)
Refer to uro/Gyn, aware of maternity leave-- Apply for medication assistance with [employer] Refill Estring until further eval Conseder progesterone.

## 2021-02-26 NOTE — Progress Notes (Signed)
Abx rx resent to [facility]. Per patient request.

## 2021-03-04 DIAGNOSIS — M545 Low back pain, unspecified: Secondary | ICD-10-CM | POA: Diagnosis not present

## 2021-03-04 DIAGNOSIS — Z96643 Presence of artificial hip joint, bilateral: Secondary | ICD-10-CM | POA: Diagnosis not present

## 2021-03-04 DIAGNOSIS — Z7901 Long term (current) use of anticoagulants: Secondary | ICD-10-CM | POA: Diagnosis not present

## 2021-03-04 DIAGNOSIS — Z9181 History of falling: Secondary | ICD-10-CM | POA: Diagnosis not present

## 2021-03-04 DIAGNOSIS — M25552 Pain in left hip: Secondary | ICD-10-CM | POA: Diagnosis not present

## 2021-03-04 DIAGNOSIS — M25551 Pain in right hip: Secondary | ICD-10-CM | POA: Diagnosis not present

## 2021-03-04 DIAGNOSIS — N390 Urinary tract infection, site not specified: Secondary | ICD-10-CM | POA: Diagnosis not present

## 2021-03-04 DIAGNOSIS — T8484XD Pain due to internal orthopedic prosthetic devices, implants and grafts, subsequent encounter: Secondary | ICD-10-CM | POA: Diagnosis not present

## 2021-03-04 DIAGNOSIS — B9620 Unspecified Escherichia coli [E. coli] as the cause of diseases classified elsewhere: Secondary | ICD-10-CM | POA: Diagnosis not present

## 2021-03-06 ENCOUNTER — Other Ambulatory Visit: Payer: Self-pay

## 2021-03-06 ENCOUNTER — Other Ambulatory Visit: Payer: Self-pay | Admitting: Family Medicine

## 2021-03-06 ENCOUNTER — Telehealth: Payer: Self-pay | Admitting: *Deleted

## 2021-03-06 ENCOUNTER — Ambulatory Visit (INDEPENDENT_AMBULATORY_CARE_PROVIDER_SITE_OTHER): Payer: Medicare Other

## 2021-03-06 DIAGNOSIS — Z7901 Long term (current) use of anticoagulants: Secondary | ICD-10-CM

## 2021-03-06 LAB — POCT INR: INR: 2 (ref 2.0–3.0)

## 2021-03-06 MED ORDER — CEPHALEXIN 500 MG PO CAPS: 500.0000 mg | ORAL_CAPSULE | Freq: Two times a day (BID) | ORAL | 0 refills | Status: DC

## 2021-03-06 NOTE — Patient Instructions (Addendum)
Pre visit review using our clinic review tool, if applicable. No additional management support is needed unless otherwise documented below in the visit note.  Continue 4 mg everyday EXCEPT take 2 mg on Wed.  Re-check on 03/16/21.

## 2021-03-06 NOTE — Telephone Encounter (Signed)
Please talk to me about her INR.  I would likely extend her keflex.  We can address when she comes in.  Thanks.

## 2021-03-06 NOTE — Telephone Encounter (Signed)
Discussed, see INR charting.  Keflex rx sent.

## 2021-03-06 NOTE — Telephone Encounter (Signed)
Pt and daughter [name] called Triage. Pt was seen on 02/26/21 and dx with UTI. Pt has finished abx and is asking for another round. Pt said that sxs have improved since finishing abx but haven't resolved completely. I asked pt what sxs she is still having and she couldn't explain in details she said she had the same sxs as before but they are all just milder. Pt said she is still "feeling bad" but not as bad as before. Pt said she is still having some retention and daughter said she has some groin pain as well.  Pt is coming in today at 2pm for her INR and daughter is wondering if we could run a UA on pt's urine while she is here.   Will route to PCP for review and coumadin nurse so she is aware

## 2021-03-08 NOTE — Progress Notes (Signed)
Agree. Thanks

## 2021-03-09 ENCOUNTER — Telehealth: Payer: Self-pay

## 2021-03-09 DIAGNOSIS — M1610 Unilateral primary osteoarthritis, unspecified hip: Secondary | ICD-10-CM

## 2021-03-09 NOTE — Telephone Encounter (Signed)
Mallory Estrada with [facility] left v/m that a [facility] PT referral was done on 03/04/21 which lacked necessary vital info; need faxed H&P, current med list, U/A & culture report form the 24th.  Pt was to have HH PT due to decreased mobility and balance and bilateral hip pain. Needs dx. Becky request cb today, 03/09/21. Sending note to [facility]. Becky did not leave a fax #.

## 2021-03-09 NOTE — Telephone Encounter (Signed)
Called Becky back to get some more information about what all is needed. She was very short and snappy with me and was able to get little from her other then what was already mentioned below. I was able to get a fax number from her to send everything to for the referral. Fax # is [phone]. I have printed and faxed over what we have in the system.

## 2021-03-10 ENCOUNTER — Ambulatory Visit (INDEPENDENT_AMBULATORY_CARE_PROVIDER_SITE_OTHER): Payer: Medicare Other | Admitting: Psychologist

## 2021-03-10 DIAGNOSIS — F320 Major depressive disorder, single episode, mild: Secondary | ICD-10-CM

## 2021-03-11 ENCOUNTER — Encounter: Payer: Self-pay | Admitting: *Deleted

## 2021-03-16 ENCOUNTER — Ambulatory Visit: Payer: Medicare Other

## 2021-03-16 DIAGNOSIS — M1610 Unilateral primary osteoarthritis, unspecified hip: Secondary | ICD-10-CM | POA: Insufficient documentation

## 2021-03-16 NOTE — Telephone Encounter (Signed)
Hip arthritis M16.10  Thanks.

## 2021-03-16 NOTE — Telephone Encounter (Signed)
Mallory Estrada called stating that they need a proper diagnosis code in order to support a need for PT. Mallory Estrada stated that a UTI is not a proper diagnosis code for PT and stated that they needed a code for hip pain, low back pain etc.

## 2021-03-16 NOTE — Telephone Encounter (Signed)
Please them use:  Gait abnormality  R26.9   Thanks.

## 2021-03-16 NOTE — Telephone Encounter (Signed)
I called Freesjre back with dx code; also added code to OV notes and faxed back to [facility].

## 2021-03-16 NOTE — Telephone Encounter (Signed)
I called and spoke to [facility] with [facility] and tried to give her the gait abnormality code; she would not accept this. She states it has to be a code that explains the gait abnormality or hip pain. Neither were acceptable and stated the claim will be denied if we can not give them a correct dx code that explains why she is needing PT and/or the gait abnormality. Please advise.

## 2021-03-17 ENCOUNTER — Telehealth: Payer: Self-pay

## 2021-03-17 ENCOUNTER — Other Ambulatory Visit: Payer: Self-pay

## 2021-03-17 ENCOUNTER — Ambulatory Visit: Payer: Medicare Other

## 2021-03-17 ENCOUNTER — Ambulatory Visit (INDEPENDENT_AMBULATORY_CARE_PROVIDER_SITE_OTHER): Payer: Medicare Other

## 2021-03-17 DIAGNOSIS — Z7901 Long term (current) use of anticoagulants: Secondary | ICD-10-CM | POA: Diagnosis not present

## 2021-03-17 LAB — POCT INR: INR: 1.6 — AB (ref 2.0–3.0)

## 2021-03-17 NOTE — Telephone Encounter (Signed)
Patient came in for her INR check with [facility] (please see separate encounter) and stated that she feels like her urinary symptoms are improving, but she still feels like she is unable to empty her bladder fully. Patient stated that she needs to sit on the commode for a few minutes to fully empty her bladder. Encouraged patient to drink lots of water. Patient verbalized understanding. Any further recommendations?

## 2021-03-17 NOTE — Patient Instructions (Addendum)
Pre visit review using our clinic review tool, if applicable. No additional management support is needed unless otherwise documented below in the visit note.  Take 8 mg today (4 tablets) and then continue 4 mg everyday EXCEPT take 2 mg on Wed. Re-check in two weeks.

## 2021-03-18 NOTE — Progress Notes (Signed)
Agree. Thanks

## 2021-03-18 NOTE — Telephone Encounter (Addendum)
Reasonable to try fluids, kegel exercises. If having more trouble or progressive sx then let me know.  Thanks.

## 2021-03-19 NOTE — Telephone Encounter (Signed)
Called and left detailed VM (ok per DPR) with Dr. Lianne Bushy recommendations.

## 2021-03-19 NOTE — Telephone Encounter (Signed)
Insurance would not cover INR machine with [employer]. Now sent to [facility] for review.

## 2021-03-20 ENCOUNTER — Telehealth: Payer: Self-pay | Admitting: *Deleted

## 2021-03-20 NOTE — Telephone Encounter (Signed)
Shawn with [facility] left a voicemail stating that they received an order for a home INR meter. Ines Bloomer stated that they need the patient's insurance information faxed to the. Ines Bloomer stated that their fax number is [phone]. Shawn stated that the order was sent in by [facility].

## 2021-03-20 NOTE — Telephone Encounter (Signed)
Insurance information faxed

## 2021-03-23 NOTE — Telephone Encounter (Signed)
Spoke with patients daughter (okay per DPR); they did get Anishas message on Dr. Armanda Heritage recommendations and have been working on this. Patient still doing okay right now.

## 2021-03-26 ENCOUNTER — Encounter: Payer: Self-pay | Admitting: Family Medicine

## 2021-03-26 ENCOUNTER — Other Ambulatory Visit: Payer: Self-pay

## 2021-03-26 ENCOUNTER — Ambulatory Visit: Payer: Medicare Other | Admitting: Psychologist

## 2021-03-26 ENCOUNTER — Ambulatory Visit (INDEPENDENT_AMBULATORY_CARE_PROVIDER_SITE_OTHER): Payer: Medicare Other | Admitting: Family Medicine

## 2021-03-26 VITALS — BP 135/85 | HR 45

## 2021-03-26 DIAGNOSIS — N390 Urinary tract infection, site not specified: Secondary | ICD-10-CM

## 2021-03-26 DIAGNOSIS — N3941 Urge incontinence: Secondary | ICD-10-CM | POA: Diagnosis not present

## 2021-03-26 HISTORY — DX: Urinary tract infection, site not specified: N39.0

## 2021-03-26 MED ORDER — PROGESTERONE MICRONIZED 100 MG PO CAPS: 100.0000 mg | ORAL_CAPSULE | Freq: Every day | ORAL | 1 refills | Status: DC

## 2021-03-26 NOTE — Assessment & Plan Note (Signed)
Referral to Uro/GYN

## 2021-03-26 NOTE — Assessment & Plan Note (Signed)
Continue estring--will provide q 3 months prog for endometrial protection.

## 2021-03-26 NOTE — Telephone Encounter (Signed)
Contacted [facility], and spoke with Casimiro Needle, who reports the insurance info does not need to be faxed, he could take info over the phone. Gave info. He is going to fax new order forms and fax number and phone number for his office. He reports he has everything needed now to proceed with the order.

## 2021-03-26 NOTE — Progress Notes (Signed)
   Subjective:    Patient ID: Mallory Estrada is a 80 y.o. female presenting with No chief complaint on file.  on 03/26/2021  HPI: Here for Estring replacement. On this for chronic/recurrent UTI.  She has significant incontinence.  Review of Systems  Constitutional: Negative for chills and fever.  Respiratory: Negative for shortness of breath.   Cardiovascular: Negative for chest pain.  Gastrointestinal: Negative for abdominal pain, nausea and vomiting.  Genitourinary: Negative for dysuria.  Skin: Negative for rash.      Objective:    BP 135/85   Pulse (!) 45  Physical Exam Constitutional:      General: She is not in acute distress.    Appearance: She is well-developed.  HENT:     Head: Normocephalic and atraumatic.  Eyes:     General: No scleral icterus. Cardiovascular:     Rate and Rhythm: Normal rate.  Pulmonary:     Effort: Pulmonary effort is normal.  Abdominal:     Palpations: Abdomen is soft.  Genitourinary:    Comments: BUS normal, vagina is pale and atrophic. Estring removed. New one is replaced easily.  Musculoskeletal:     Cervical back: Neck supple.  Skin:    General: Skin is warm and dry.  Neurological:     Mental Status: She is alert and oriented to person, place, and time.         Assessment & Plan:   Problem List Items Addressed This Visit      Unprioritized   Urinary incontinence - Primary    Referral to Uro/GYN      Recurrent UTI    Continue estring--will provide q 3 months prog for endometrial protection.      Relevant Medications   progesterone (PROMETRIUM) 100 MG capsule      Return in about 3 months (around 06/26/2021) for estring change.  Reva Bores 03/26/2021 12:09 PM

## 2021-03-27 ENCOUNTER — Ambulatory Visit (HOSPITAL_BASED_OUTPATIENT_CLINIC_OR_DEPARTMENT_OTHER): Payer: Medicare Other | Admitting: Obstetrics & Gynecology

## 2021-03-27 ENCOUNTER — Telehealth: Payer: Self-pay | Admitting: *Deleted

## 2021-03-27 NOTE — Telephone Encounter (Signed)
Noted. Thanks.

## 2021-03-27 NOTE — Telephone Encounter (Signed)
Roger Shelter, Medical Social Worker left VM at [employer]. Eunice Blase was suppose to see pt today for her last appt to d/c pt from social work services. All of pt's goals have been meet and social worker has provider pt with resources to help in all areas including mental health. Daughter called Eunice Blase this morning and said that they decided that pt doesn't need final appt pt is stable, has family/daughter to help her and they don't need anything else from Child psychotherapist. Eunice Blase is just letting PCP know that they are d/c pt from their services without her final appt  FYI to PCP

## 2021-03-31 ENCOUNTER — Ambulatory Visit: Payer: Medicare Other

## 2021-04-01 ENCOUNTER — Ambulatory Visit (INDEPENDENT_AMBULATORY_CARE_PROVIDER_SITE_OTHER): Payer: Medicare Other | Admitting: Psychologist

## 2021-04-01 DIAGNOSIS — F320 Major depressive disorder, single episode, mild: Secondary | ICD-10-CM

## 2021-04-02 ENCOUNTER — Other Ambulatory Visit: Payer: Self-pay

## 2021-04-02 ENCOUNTER — Ambulatory Visit (INDEPENDENT_AMBULATORY_CARE_PROVIDER_SITE_OTHER): Payer: Medicare Other

## 2021-04-02 DIAGNOSIS — Z7901 Long term (current) use of anticoagulants: Secondary | ICD-10-CM

## 2021-04-02 LAB — POCT INR: INR: 2.4 (ref 2.0–3.0)

## 2021-04-02 NOTE — Patient Instructions (Addendum)
Pre visit review using our clinic review tool, if applicable. No additional management support is needed unless otherwise documented below in the visit note.  Continue 4 mg everyday EXCEPT take 2 mg on Wed. Re-check in 4 weeks.

## 2021-04-03 DIAGNOSIS — Z9181 History of falling: Secondary | ICD-10-CM | POA: Diagnosis not present

## 2021-04-03 DIAGNOSIS — N390 Urinary tract infection, site not specified: Secondary | ICD-10-CM | POA: Diagnosis not present

## 2021-04-03 DIAGNOSIS — M25551 Pain in right hip: Secondary | ICD-10-CM | POA: Diagnosis not present

## 2021-04-03 DIAGNOSIS — Z7901 Long term (current) use of anticoagulants: Secondary | ICD-10-CM | POA: Diagnosis not present

## 2021-04-03 DIAGNOSIS — Z96643 Presence of artificial hip joint, bilateral: Secondary | ICD-10-CM | POA: Diagnosis not present

## 2021-04-03 DIAGNOSIS — M25552 Pain in left hip: Secondary | ICD-10-CM | POA: Diagnosis not present

## 2021-04-03 DIAGNOSIS — M545 Low back pain, unspecified: Secondary | ICD-10-CM | POA: Diagnosis not present

## 2021-04-03 DIAGNOSIS — B9620 Unspecified Escherichia coli [E. coli] as the cause of diseases classified elsewhere: Secondary | ICD-10-CM | POA: Diagnosis not present

## 2021-04-03 DIAGNOSIS — T8484XD Pain due to internal orthopedic prosthetic devices, implants and grafts, subsequent encounter: Secondary | ICD-10-CM | POA: Diagnosis not present

## 2021-04-06 ENCOUNTER — Telehealth: Payer: Self-pay | Admitting: *Deleted

## 2021-04-06 NOTE — Telephone Encounter (Signed)
Called and requested refill on Estring. It will be released on July 12th and will take 7-10 days for delivery (order # [id number])

## 2021-04-08 NOTE — Progress Notes (Signed)
Agree. Thanks

## 2021-04-10 ENCOUNTER — Telehealth: Payer: Self-pay

## 2021-04-10 MED ORDER — WARFARIN SODIUM 2 MG PO TABS
ORAL_TABLET | ORAL | 0 refills | Status: DC
Start: 2021-04-10 — End: 2021-05-13

## 2021-04-10 NOTE — Telephone Encounter (Signed)
Pt left VM requesting refill of Jantoven. She has previously received warfarin here but said she had always used Jantoven from her prior clinics. She requested script sent to [facility] Sent in script for name brand Jantoven.

## 2021-04-13 ENCOUNTER — Other Ambulatory Visit: Payer: Self-pay | Admitting: *Deleted

## 2021-04-13 MED ORDER — ATORVASTATIN CALCIUM 10 MG PO TABS: 10.0000 mg | ORAL_TABLET | Freq: Every day | ORAL | 3 refills | Status: DC

## 2021-04-13 NOTE — Telephone Encounter (Signed)
Patient left a voicemail stating that she needs a new script for Atorvastatin to be sent to [facility]. Patient stated that this is new to [facility].

## 2021-04-14 ENCOUNTER — Telehealth: Payer: Self-pay

## 2021-04-14 NOTE — Telephone Encounter (Signed)
Spoke with [employer] per DPR. Patients insurance will not cover cyclobenzaprine anymore but can get rx with good rx card at [facility] for under 5 bucks. She wants new 30 day supply sent to [facility] [place].

## 2021-04-14 NOTE — Telephone Encounter (Signed)
Mallory Estrada (DPR signed) left v/m that in April 2022 [facility]obenz[facility] was denied by ins co. Pt just got letter from ins co that the provider needs to explain why exception to rule to fill cyclobenzaprine is necessary for pt to be able to get cyclobenzaprine. [name] request cb.

## 2021-04-15 MED ORDER — CYCLOBENZAPRINE HCL 5 MG PO TABS: 5.0000 mg | ORAL_TABLET | Freq: Three times a day (TID) | ORAL | 1 refills | Status: DC | PRN

## 2021-04-15 NOTE — Addendum Note (Signed)
Addended by: Joaquim Nam on: 04/15/2021 04:05 PM   Modules accepted: Orders

## 2021-04-15 NOTE — Telephone Encounter (Signed)
Mallory Estrada and patient aware rx was sent.

## 2021-04-15 NOTE — Telephone Encounter (Signed)
[name], called to check on this. Patient is having back muscle spasms currently and wanted to get this medication today due to having symptoms.  Since Dr Para March is out and patient is symptomatic sending to Dr Reece Agar for review to see if he can help out.

## 2021-04-15 NOTE — Telephone Encounter (Signed)
Sent. Thanks.   

## 2021-04-22 ENCOUNTER — Ambulatory Visit (INDEPENDENT_AMBULATORY_CARE_PROVIDER_SITE_OTHER): Payer: Medicare Other

## 2021-04-22 ENCOUNTER — Telehealth: Payer: Self-pay

## 2021-04-22 DIAGNOSIS — Z7901 Long term (current) use of anticoagulants: Secondary | ICD-10-CM | POA: Diagnosis not present

## 2021-04-22 LAB — POCT INR: INR: 1.5 — AB (ref 2.0–3.0)

## 2021-04-22 NOTE — Telephone Encounter (Signed)
Mallory Estrada(DPR signed) left v/m that [facility] IN[facility] home tester nurse Shanda Bumps with [facility] saw pt on 04/21/21 and INR at 1:30 PM on 04/21/21 was 1.5 which is low and [name] wants to know how to adjust med to be in range between 2 and 3. Can leave detailed v/m if no answer. Sending note to [facility]. Sent note also thru Teams.

## 2021-04-22 NOTE — Patient Instructions (Signed)
INR today 1.5 (patient is now on home monitoring) Spoke with daughter, Herma Mering [place]) Patient will take an extra 1/2 today (6/22 - 3 mg) and tomorrow (6/23 - 6mg ) and then Continue 4 mg everyday EXCEPT take 2 mg on Wed. Re-check in 1 week.   Denies any changes in health or medications, only excess green consumption.   Patient is on home monitoring now and we will cancel next appt here in the office on 6/28.  She will check at home next Thursday and call the office with reading.   Daughter educated on warning signs associated with subtherapeutic level and she will take mother to ER if any concerns develop.

## 2021-04-22 NOTE — Telephone Encounter (Addendum)
Noted please refer to anticoag encounter for my dosing instructions on patient.  Daughter has been contacted.  Patient now has home monitoring checks and daughter will call next week with results.     Thanks.

## 2021-04-27 ENCOUNTER — Ambulatory Visit
Admission: RE | Admit: 2021-04-27 | Discharge: 2021-04-27 | Disposition: A | Discharge status: Home / Self Care | Payer: Medicare Other | Source: Ambulatory Visit | Attending: Family Medicine | Admitting: Family Medicine

## 2021-04-27 ENCOUNTER — Telehealth: Payer: Self-pay | Admitting: Family Medicine

## 2021-04-27 ENCOUNTER — Other Ambulatory Visit: Payer: Self-pay

## 2021-04-27 DIAGNOSIS — Z1231 Encounter for screening mammogram for malignant neoplasm of breast: Secondary | ICD-10-CM

## 2021-04-27 NOTE — Telephone Encounter (Signed)
Noted  

## 2021-04-27 NOTE — Telephone Encounter (Signed)
Mrs. [name] called in and wanted to cancel the appointment for 6/28 due to they have a machine and they will call on Thursday with the results

## 2021-04-28 ENCOUNTER — Ambulatory Visit: Payer: Medicare Other

## 2021-04-29 ENCOUNTER — Ambulatory Visit: Payer: Medicare Other | Admitting: Psychologist

## 2021-04-29 ENCOUNTER — Other Ambulatory Visit: Payer: Self-pay | Admitting: Family Medicine

## 2021-04-29 DIAGNOSIS — R928 Other abnormal and inconclusive findings on diagnostic imaging of breast: Secondary | ICD-10-CM

## 2021-04-30 ENCOUNTER — Ambulatory Visit (INDEPENDENT_AMBULATORY_CARE_PROVIDER_SITE_OTHER): Payer: Medicare Other

## 2021-04-30 DIAGNOSIS — Z7901 Long term (current) use of anticoagulants: Secondary | ICD-10-CM | POA: Diagnosis not present

## 2021-04-30 LAB — POCT INR
INR: 2.9 (ref 2.0–3.0)
INR: 4 — AB (ref 2.0–3.0)

## 2021-04-30 NOTE — Patient Instructions (Signed)
Pre visit review using our clinic review tool, if applicable. No additional management support is needed unless otherwise documented below in the visit note. 

## 2021-05-04 NOTE — Progress Notes (Signed)
Agree. Thanks

## 2021-05-07 ENCOUNTER — Ambulatory Visit (INDEPENDENT_AMBULATORY_CARE_PROVIDER_SITE_OTHER): Payer: Medicare Other

## 2021-05-07 DIAGNOSIS — Z7901 Long term (current) use of anticoagulants: Secondary | ICD-10-CM | POA: Diagnosis not present

## 2021-05-07 LAB — POCT INR: INR: 2.1 (ref 2.0–3.0)

## 2021-05-07 NOTE — Patient Instructions (Addendum)
Pre visit review using our clinic review tool, if applicable. No additional management support is needed unless otherwise documented below in the visit note.  Advised to continue taking 4mg  daily except take 2mg  on Wednesdays and recheck in one week. Pt verbalized understanding.

## 2021-05-10 NOTE — Progress Notes (Signed)
Agree. Thanks

## 2021-05-12 ENCOUNTER — Other Ambulatory Visit: Payer: Self-pay | Admitting: Family Medicine

## 2021-05-14 ENCOUNTER — Ambulatory Visit (INDEPENDENT_AMBULATORY_CARE_PROVIDER_SITE_OTHER): Payer: Medicare Other

## 2021-05-14 DIAGNOSIS — Z7901 Long term (current) use of anticoagulants: Secondary | ICD-10-CM

## 2021-05-14 LAB — POCT INR: INR: 2.4 (ref 2.0–3.0)

## 2021-05-14 NOTE — Patient Instructions (Addendum)
Pre visit review using our clinic review tool, if applicable. No additional management support is needed unless otherwise documented below in the visit note.  Advised to continue taking 4mg daily except take 2mg on Wednesdays and recheck in one week.  

## 2021-05-15 ENCOUNTER — Encounter: Payer: Self-pay | Admitting: Radiology

## 2021-05-17 NOTE — Progress Notes (Signed)
Agree. Thanks

## 2021-05-18 ENCOUNTER — Telehealth: Payer: Self-pay | Admitting: Interventional Cardiology

## 2021-05-18 MED ORDER — METOPROLOL SUCCINATE ER 25 MG PO TB24: 25.0000 mg | ORAL_TABLET | Freq: Every day | ORAL | 0 refills | Status: DC

## 2021-05-18 NOTE — Telephone Encounter (Signed)
90 Day refill for Metoprolol has been sent to [facility], per pt request.  Did ask pt to call and make a follow-up appointment that is due in August.

## 2021-05-18 NOTE — Telephone Encounter (Signed)
*  STAT* If patient is at the pharmacy, call can be transferred to refill team.   1. Which medications need to be refilled? (please list name of each medication and dose if known)   metoprolol succinate (TOPROL XL) 25 MG 24 hr tablet    2. Which pharmacy/location (including street and city if local pharmacy) is medication to be sent to? [facility] - [place], [place] - [address]  3. Do they need a 30 day or 90 day supply?  90 day supply

## 2021-05-19 ENCOUNTER — Other Ambulatory Visit: Payer: Self-pay | Admitting: *Deleted

## 2021-05-19 DIAGNOSIS — N390 Urinary tract infection, site not specified: Secondary | ICD-10-CM

## 2021-05-19 MED ORDER — PROGESTERONE MICRONIZED 100 MG PO CAPS: 100.0000 mg | ORAL_CAPSULE | Freq: Every day | ORAL | 1 refills | Status: DC

## 2021-05-21 ENCOUNTER — Ambulatory Visit (INDEPENDENT_AMBULATORY_CARE_PROVIDER_SITE_OTHER): Payer: Medicare Other

## 2021-05-21 DIAGNOSIS — Z7901 Long term (current) use of anticoagulants: Secondary | ICD-10-CM

## 2021-05-21 LAB — POCT INR: INR: 2.5 (ref 2.0–3.0)

## 2021-05-21 NOTE — Patient Instructions (Addendum)
Pre visit review using our clinic review tool, if applicable. No additional management support is needed unless otherwise documented below in the visit note.  Advised to continue taking 4mg daily except take 2mg on Wednesdays and recheck in one week. Pt verbalized understanding and refused having AVS mailed. 

## 2021-05-22 ENCOUNTER — Other Ambulatory Visit: Payer: Self-pay | Admitting: Family Medicine

## 2021-05-22 ENCOUNTER — Ambulatory Visit
Admission: RE | Admit: 2021-05-22 | Discharge: 2021-05-22 | Disposition: A | Discharge status: Home / Self Care | Payer: Medicare Other | Source: Ambulatory Visit | Attending: Family Medicine | Admitting: Family Medicine

## 2021-05-22 ENCOUNTER — Other Ambulatory Visit: Payer: Self-pay

## 2021-05-22 DIAGNOSIS — R928 Other abnormal and inconclusive findings on diagnostic imaging of breast: Secondary | ICD-10-CM | POA: Diagnosis not present

## 2021-05-22 DIAGNOSIS — R922 Inconclusive mammogram: Secondary | ICD-10-CM | POA: Diagnosis not present

## 2021-05-22 IMAGING — MG MM DIGITAL DIAGNOSTIC UNILAT*L* W/ TOMO W/ CAD
8 series · 8 of 24 positions shown · non-contrast
Comparison: Screening mammogram dated [DATE].

CLINICAL DATA: Patient was recalled from screening mammogram for
possible mass, asymmetries and axillary lymph node in the left
breast.

EXAM:
DIGITAL DIAGNOSTIC UNILATERAL LEFT MAMMOGRAM WITH TOMOSYNTHESIS AND
CAD; ULTRASOUND LEFT BREAST LIMITED
TECHNIQUE: Left digital diagnostic mammography and breast tomosynthesis was
performed. The images were evaluated with computer-aided detection.;
Targeted ultrasound examination of the left breast was performed

[L CC synth-2D (1 of 3)]
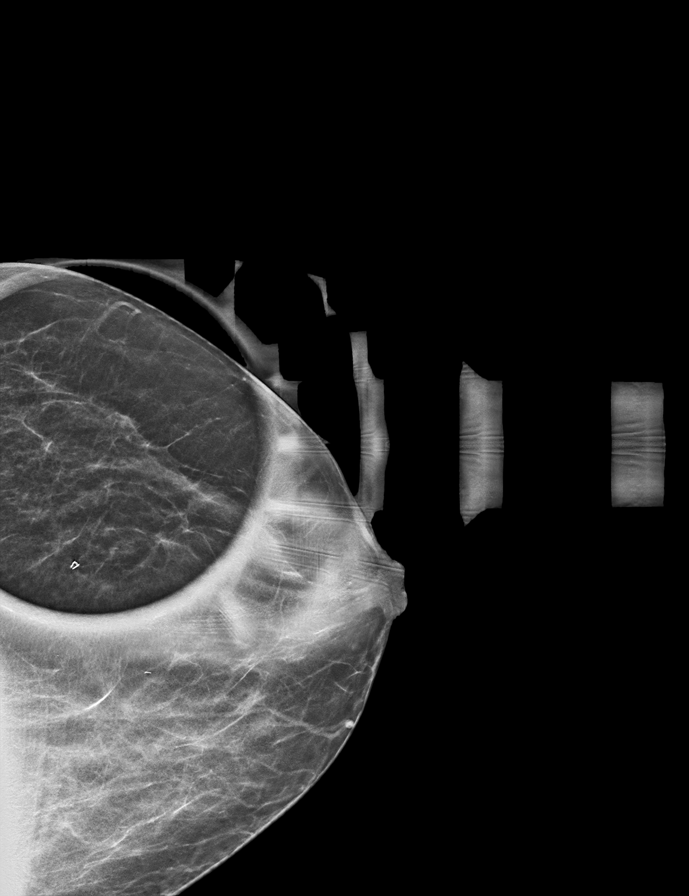

[L ML synth-2D]
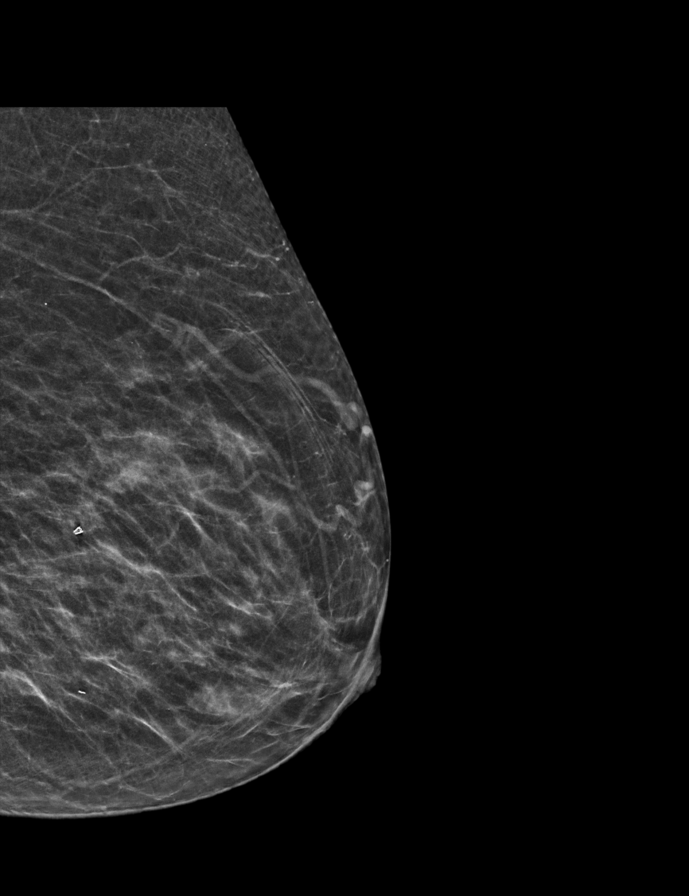

[L CC synth-2D (2 of 3)]
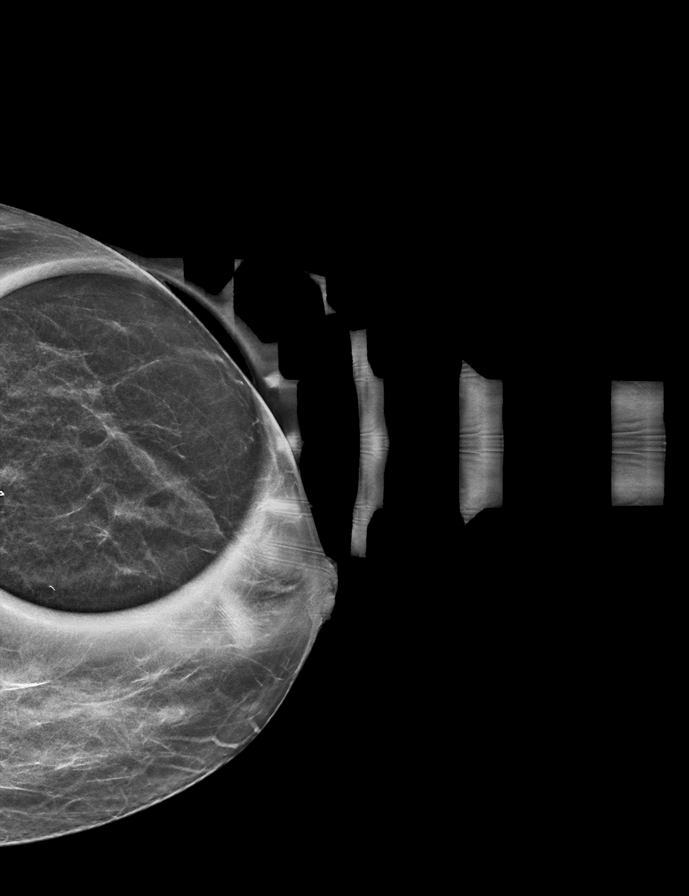

[L CC synth-2D (3 of 3)]
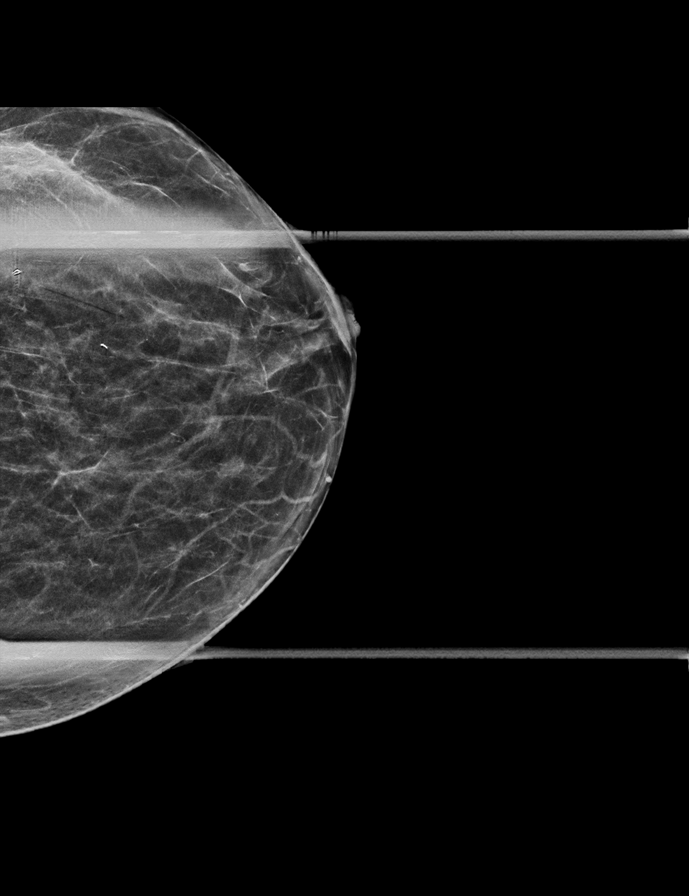

[L ML tomo · tomo slice 25/48.0]
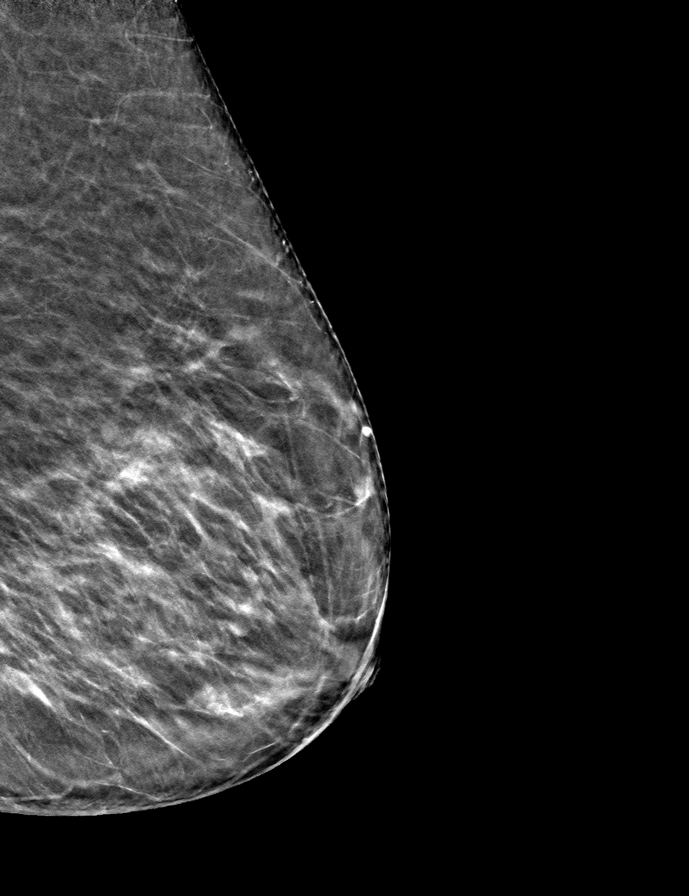

[L CC tomo (1 of 3) · tomo slice 20/39.0]
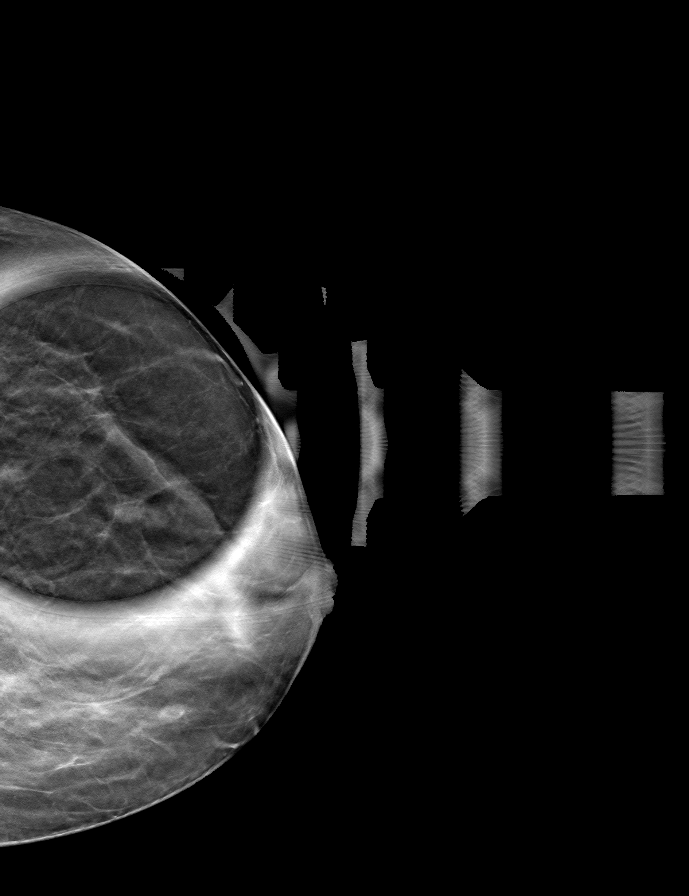

[L CC tomo (2 of 3) · tomo slice 19/38.0]
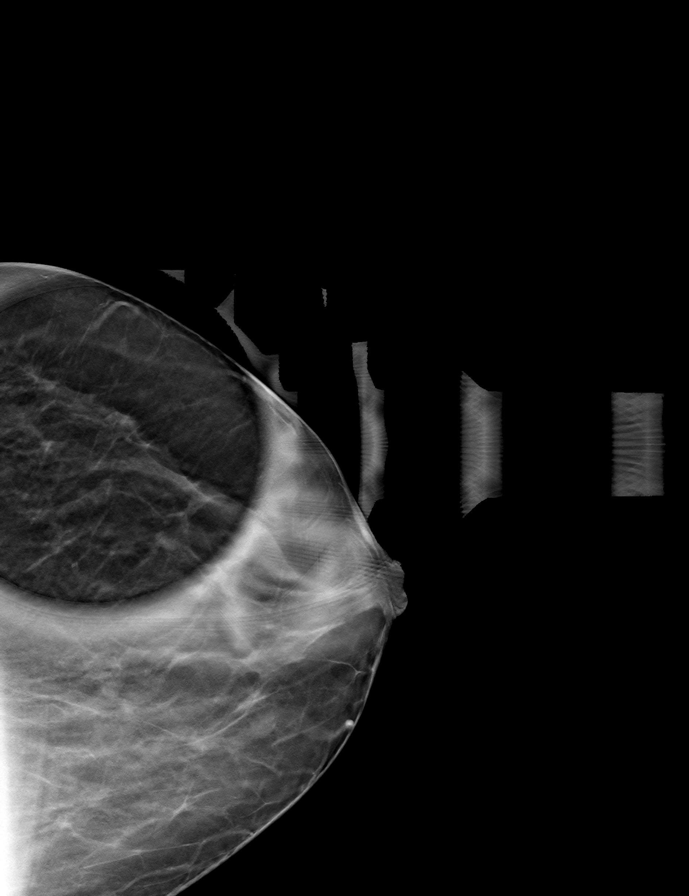

[L CC tomo (3 of 3) · tomo slice 25/48.0]
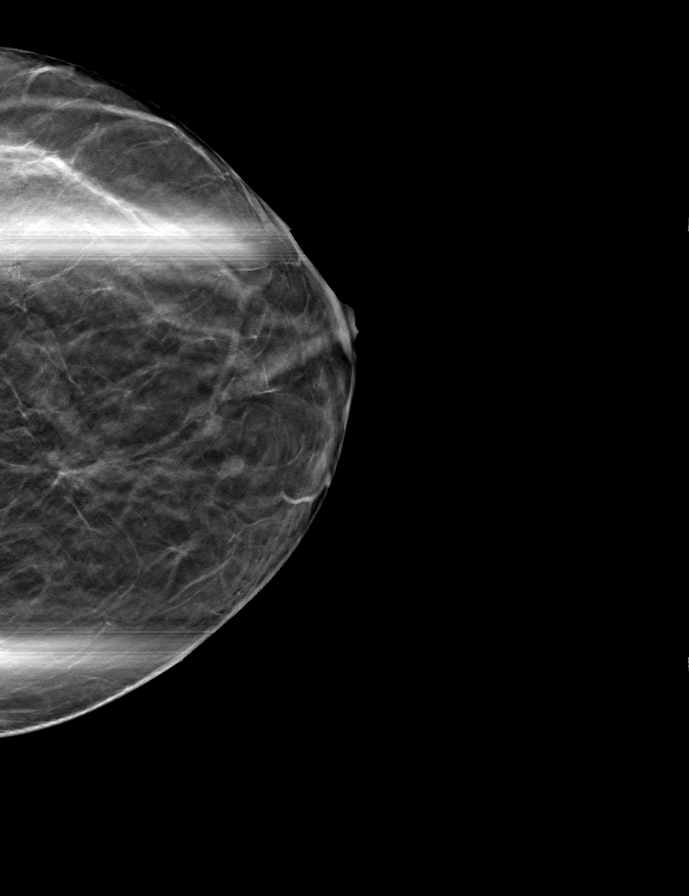

[8 of 24 positions shown; findings below may reference images not displayed]

Patient does not
recall where her prior mammograms were performed and states that
they were greater than 10 years ago.

ACR Breast Density Category b: There are scattered areas of
fibroglandular density.
FINDINGS: Additional imaging of the left breast was performed. The asymmetry
in the lateral aspect of the left breast disperses on the additional
imaging. The asymmetries in the medial aspect of the left breast are
also less conspicuous on the additional imaging.

Targeted ultrasound is performed, showing A benign appearing cyst in
the 9 o'clock retroareolar region of the left breast measuring 4 x 2
x 3 mm. No additional mass is seen in the lateral or medial aspect
of the breast. Sonographic evaluation the left axilla shows a normal
lymph node
IMPRESSION: Probable benign findings in the left breast.

RECOMMENDATION:
Short-term interval follow-up left mammogram in 6 months is
recommended.

I have discussed the findings and recommendations with the patient.
If applicable, a reminder letter will be sent to the patient
regarding the next appointment.

BI-RADS CATEGORY  3: Probably benign.

## 2021-05-24 NOTE — Progress Notes (Signed)
Agree. Thanks

## 2021-05-28 ENCOUNTER — Other Ambulatory Visit: Payer: Self-pay | Admitting: Family Medicine

## 2021-05-28 ENCOUNTER — Ambulatory Visit (INDEPENDENT_AMBULATORY_CARE_PROVIDER_SITE_OTHER): Payer: Medicare Other

## 2021-05-28 DIAGNOSIS — Z7901 Long term (current) use of anticoagulants: Secondary | ICD-10-CM

## 2021-05-28 LAB — POCT INR: INR: 2.8 (ref 2.0–3.0)

## 2021-05-28 NOTE — Patient Instructions (Addendum)
Pre visit review using our clinic review tool, if applicable. No additional management support is needed unless otherwise documented below in the visit note.  Advised to continue taking 4mg  daily except take 2mg  on Wednesdays and recheck in one week. Pt verbalized understanding and refused having AVS mailed.

## 2021-05-31 NOTE — Progress Notes (Signed)
Agree. Thanks

## 2021-06-01 ENCOUNTER — Other Ambulatory Visit: Payer: Self-pay | Admitting: Family Medicine

## 2021-06-03 ENCOUNTER — Ambulatory Visit: Payer: Medicare Other | Admitting: Obstetrics and Gynecology

## 2021-06-04 ENCOUNTER — Ambulatory Visit (INDEPENDENT_AMBULATORY_CARE_PROVIDER_SITE_OTHER): Payer: Medicare Other

## 2021-06-04 DIAGNOSIS — Z7901 Long term (current) use of anticoagulants: Secondary | ICD-10-CM | POA: Diagnosis not present

## 2021-06-04 LAB — POCT INR: INR: 2.8 (ref 2.0–3.0)

## 2021-06-04 NOTE — Patient Instructions (Addendum)
Pre visit review using our clinic review tool, if applicable. No additional management support is needed unless otherwise documented below in the visit note.  Advised to continue taking 4mg  daily except take 2mg  on Wednesdays and recheck in one week.

## 2021-06-05 NOTE — Progress Notes (Signed)
Agree. Thanks

## 2021-06-11 ENCOUNTER — Ambulatory Visit (INDEPENDENT_AMBULATORY_CARE_PROVIDER_SITE_OTHER): Payer: Medicare Other

## 2021-06-11 DIAGNOSIS — Z7901 Long term (current) use of anticoagulants: Secondary | ICD-10-CM

## 2021-06-11 LAB — POCT INR: INR: 2.7 (ref 2.0–3.0)

## 2021-06-11 NOTE — Patient Instructions (Addendum)
Pre visit review using our clinic review tool, if applicable. No additional management support is needed unless otherwise documented below in the visit note.  Advised to continue taking 4mg daily except take 2mg on Wednesdays and recheck in one week.  

## 2021-06-12 ENCOUNTER — Telehealth: Payer: Self-pay | Admitting: *Deleted

## 2021-06-12 NOTE — Telephone Encounter (Signed)
PLEASE NOTE: All timestamps contained within this report are represented as [place] Standard Time. CONFIDENTIALTY NOTICE: This fax transmission is intended only for the addressee. It contains information that is legally privileged, confidential or otherwise protected from use or disclosure. If you are not the intended recipient, you are strictly prohibited from reviewing, disclosing, copying using or disseminating any of this information or taking any action in reliance on or regarding this information. If you have received this fax in error, please notify us immediately by telephone so that we can arrange for its return to [place]. Phone: [phone], Toll-Free: [phone], Fax: [phone] Page: 1 of 2 Call Id: [id number] [facility] Day - Client TELEPHONE ADVICE RECORD AccessNurse Patient Name: Mallory Estrada [facility] Gender: Female DOB: [birth year] Age: 19 Y 3 M 27 D Return Phone Number: [phone] (Primary) Address: City/ State/ Zip: [place]  [address] Client [facility] Day - Client Client Site [facility] - Day Physician Raechel Ache - MD Contact Type Call Who Is Calling Patient / Member / Family / Caregiver Call Type Triage / Clinical Caller Name [name] Relationship To Patient Daughter Return Phone Number [phone] (Primary) Chief Complaint Finger Injury Reason for Call Symptomatic / Request for Health Information Initial Comment Her mother hurt her finger, thinks is dislocated. Is painful and tender Translation No Nurse Assessment Nurse: Lovelace, RN, Amy Date/Time (Eastern Time): 06/12/2021 8:48:25 AM Confirm and document reason for call. If symptomatic, describe symptoms. ---Caller states her mother hurt her finger. It is the pointer finger on the right. She was trying to wipe the side of a hammock thing in front of her chair. She has arthritis, so she has to pick how she uses her hand.  She rolled up a paper towel & was putting pressure on the finger to wipe. She thinks it is dislocated. It is painful and tender. No swelling or redness. She cannot use it. It feels most comfortable in the bent position. They have iced it through the night. Does the patient have any new or worsening symptoms? ---Yes Will a triage be completed? ---Yes Related visit to physician within the last 2 weeks? ---No Does the PT have any chronic conditions? (i.e. diabetes, asthma, this includes High risk factors for pregnancy, etc.) ---Unknown Is this a behavioral health or substance abuse call? ---No Guidelines Guideline Title Affirmed Question Affirmed Notes Nurse Date/Time Lamount Cohen Time) Finger Injury Jammed finger Lovelace, RN, Amy 06/12/2021 8:51:20 AM Disp. Time Lamount Cohen Time) Disposition Final User PLEASE NOTE: All timestamps contained within this report are represented as [place] Standard Time. CONFIDENTIALTY NOTICE: This fax transmission is intended only for the addressee. It contains information that is legally privileged, confidential or otherwise protected from use or disclosure. If you are not the intended recipient, you are strictly prohibited from reviewing, disclosing, copying using or disseminating any of this information or taking any action in reliance on or regarding this information. If you have received this fax in error, please notify us immediately by telephone so that we can arrange for its return to [place]. Phone: [phone], Toll-Free: [phone], Fax: [phone] Page: 2 of 2 Call Id: [id number] 06/12/2021 8:55:46 AM Home Care Yes Lovelace, RN, Amy Caller Disagree/Comply Comply Caller Understands Yes PreDisposition InappropriateToAsk Care Advice Given Per Guideline HOME CARE: * You should be able to treat this at home. * Sprain is the medical term used to describe over-stretching of the ligaments of the finger. This can happen when the finger is  twisted or bent in the  wrong way. It can happen when the finger is 'jammed', for example, when the end of a softball hits a finger. * The main symptom is pain that is worse with movement. Swelling can occur. There is usually no bruising; if bruising is present it can be a sign that there is a small fracture. PAIN MEDICINES: * For pain relief, you can take either acetaminophen, ibuprofen, or naproxen. * Use a heat pack, heating pad, or warm wet washcloth. * Do this for 10 minutes three times a day. USE HEAT ON AREA AFTER 48 HOURS: * If pain, swelling, or bruising last more than 48 hours (2 days), then use heat on the area. CALL BACK IF: * Severe pain persists over 2 hours after pain medicine and ice * Pain not improved after 48 hours * Not using finger normally by 1 week * You become worse CARE ADVICE given per Finger Injury (Adult) guideline. REASSURANCE AND EDUCATION - SPRAINED FINGER (JAMMED, OVER-STRETCHED): * Put a cold pack or an ice bag (wrapped in a moist towel) on the area for 20 minutes. USE A COLD PACK FOR PAIN, SWELLING, OR BRUISING: * Repeat in 1 hour, then every 4 hours while awake. * This will help increase blood flow and improve healing. * Continue this for the first 48 hours (2 days) after an injury. * This will help decrease pain, swelling, and bruising. * Here is some care advice that should help.

## 2021-06-12 NOTE — Telephone Encounter (Signed)
Agree with ortho eval.  Thanks.

## 2021-06-12 NOTE — Telephone Encounter (Signed)
Called and spoke to patient's daughter [name] (on [place]). [name] stated the nurse that she talked to earlier just gave her home care. [name] stated that she is not sure that home care is enough especially with the weekend coming up. [name] was given information on [facility] in [place]. [name] stated that she is out now running a few errands and will see how her mom is feeling when she gets home. [name] stated if her mom is having a lot of discomfort with her finger she will plan on taking her to the walk in clinic at [facility] today.

## 2021-06-14 NOTE — Progress Notes (Signed)
Agree. Thanks

## 2021-06-16 ENCOUNTER — Telehealth: Payer: Self-pay | Admitting: Family Medicine

## 2021-06-16 NOTE — Telephone Encounter (Signed)
Spoke with Frances Furbish and clarified lasix rx on record. Nothing further needed.

## 2021-06-16 NOTE — Telephone Encounter (Signed)
[facility] called they are needing clarification on the lasix direction Please call [phone]

## 2021-06-16 NOTE — Telephone Encounter (Signed)
Mallory Estrada (DPR signed) said that her mom thought her rt index finger was OK and did not go to [facility]. Recently the rt index finger is not so much hurting but does not feel right. Occasionally pt will touch or move it wrong and has a undescribable sensation. Mallory Estrada wanted to know if she should take pt to [facility]. Advised Mallory Estrada yes and Dr Para March thought ortho eval was appropriate from 06/12/21 note. Mallory Estrada voiced understanding and will take pt to [facility] in [place]. Sending note to Dr Para March as Lorain Childes.

## 2021-06-17 DIAGNOSIS — M13842 Other specified arthritis, left hand: Secondary | ICD-10-CM | POA: Diagnosis not present

## 2021-06-17 DIAGNOSIS — M13841 Other specified arthritis, right hand: Secondary | ICD-10-CM | POA: Diagnosis not present

## 2021-06-17 DIAGNOSIS — M19041 Primary osteoarthritis, right hand: Secondary | ICD-10-CM | POA: Insufficient documentation

## 2021-06-17 DIAGNOSIS — M19042 Primary osteoarthritis, left hand: Secondary | ICD-10-CM

## 2021-06-17 HISTORY — DX: Primary osteoarthritis, right hand: M19.041

## 2021-06-17 HISTORY — DX: Primary osteoarthritis, right hand: M19.042

## 2021-06-17 NOTE — Telephone Encounter (Signed)
Noted. Thanks.

## 2021-06-18 ENCOUNTER — Telehealth: Payer: Self-pay | Admitting: Family Medicine

## 2021-06-18 DIAGNOSIS — Z7901 Long term (current) use of anticoagulants: Secondary | ICD-10-CM

## 2021-06-18 NOTE — Telephone Encounter (Signed)
  Encourage patient to contact the pharmacy for refills or they can request refills through [facility]  LAST APPOINTMENT DATE:  Please schedule appointment if longer than 1 year  NEXT APPOINTMENT DATE:  MEDICATION: furosemide (LASIX) 20 MG tablet   The patient stated that she takes 2 pills Monday, Wednesday, Friday. And 1 pill Tuesday, Thursday, Saturday, and sunday  Is the patient out of medication?  yes  [facility]   Let patient know to contact pharmacy at the end of the day to make sure medication is ready.  Please notify patient to allow 48-72 hours to process  CLINICAL FILLS OUT ALL BELOW:   LAST REFILL:  QTY:  REFILL DATE:    OTHER COMMENTS:    Okay for refill?  Please advise

## 2021-06-18 NOTE — Progress Notes (Signed)
[facility] New Patient Evaluation and Consultation  Referring Provider: Reva Bores, MD PCP: Joaquim Nam, MD Date of Service: 06/19/2021  SUBJECTIVE Chief Complaint: New Patient (Initial Visit)  History of Present Illness: Mallory Estrada is a 80 y.o. White or Caucasian female seen in consultation at the request of Dr. Shawnie Pons for evaluation of incontinence.    Review of records from Dr Shawnie Pons significant for: Has significant incontinence. Uses estring for prophylaxis for recurrent UTI.   Urinary Symptoms: Leaks urine with cough/ sneeze, laughing, going from sitting to standing, with a full bladder, and with movement to the bathroom. UUI> SUI Leaks 4 time(s) per days. Has a periods of time after the lasix that makes her go more often.  Pad use: 1-2 adult diapers per day.   She is bothered by her UI symptoms. Using Myrbetriq 50mg  x3 months, has not seen much difference.   Day time voids 6-8.  Nocturia: 1 times per night to void. Voiding dysfunction: she empties her bladder well.  does not use a catheter to empty bladder.  When urinating, she feels a weak stream Drinks: mostly water, 1 cup cappuchino per day  UTIs: 1 UTI's in the last year.  Uses estring for prophylaxis.  Denies history of blood in urine and kidney or bladder stones  Pelvic Organ Prolapse Symptoms:                  She Denies a feeling of a bulge the vaginal area.   Bowel Symptom: Bowel movements: every day to every other day Stool consistency: soft  Straining: no.  Splinting: no.  Incomplete evacuation: no.  She Denies accidental bowel leakage / fecal incontinence Bowel regimen: stool softener  Sexual Function Sexually active: no.  Sexual orientation: Straight  Pelvic Pain Denies pelvic pain  Past Medical History:  Past Medical History:  Diagnosis Date   Arthritis    Atrial fibrillation (HCC)    Noted postop.   Depression    Gait abnormality    Hyperlipidemia     Hypothyroidism    Ophthalmoplegic migraine    SUI (stress urinary incontinence, female)      Past Surgical History:   Past Surgical History:  Procedure Laterality Date   BACK SURGERY     BREAST BIOPSY     HIP SURGERY Bilateral    KNEE SURGERY Bilateral    OOPHORECTOMY Left    TONSILLECTOMY AND ADENOIDECTOMY       Past OB/GYN History: OB History     Gravida  5   Para  3   Term  3   Preterm      AB  2   Living  3      SAB  2   IAB      Ectopic      Multiple      Live Births  3          2 SVD, 1 forceps Menopausal: Yes, at age 74, Denies vaginal bleeding since menopause   Medications: She has a current medication list which includes the following prescription(s): atorvastatin, vitamin d3, cyclobenzaprine, estring, furosemide, levothyroxine, magnesium gluconate, melatonin, metoprolol succinate, potassium chloride sa, vibegron, progesterone, warfarin, and warfarin.   Allergies: Patient is allergic to fentanyl, ketamine, macrobid [nitrofurantoin], and sulfa antibiotics.   Social History:  Social History   Tobacco Use   Smoking status: Former    Types: Cigarettes    Quit date: 11/01/1970    Years since quitting: 50.6  Smokeless tobacco: Never  Vaping Use   Vaping Use: Never used  Substance Use Topics   Alcohol use: Yes   Drug use: Never    Family History:   Family History  Problem Relation Age of Onset   Arthritis Mother    Arthritis Father    Diabetes Father    Heart disease Father    Arthritis Sister    Heart disease Brother    Colon cancer Brother    Heart attack Brother    Breast cancer Maternal Aunt      Review of Systems: Review of Systems  Constitutional:  Negative for fever, malaise/fatigue and weight loss.  Respiratory:  Negative for cough, shortness of breath and wheezing.   Cardiovascular:  Positive for leg swelling. Negative for chest pain and palpitations.  Gastrointestinal:  Negative for abdominal pain and blood in  stool.  Genitourinary:  Negative for dysuria.  Musculoskeletal:  Positive for myalgias.  Skin:  Negative for rash.  Neurological:  Negative for dizziness and headaches.  Endo/Heme/Allergies:  Does not bruise/bleed easily.  Psychiatric/Behavioral:  Negative for depression. The patient is not nervous/anxious.     OBJECTIVE Physical Exam: Vitals:   06/19/21 1115  BP: (!) 147/80  Pulse: (!) 57  Weight: 192 lb (87.1 kg)    Physical Exam Constitutional:      General: She is not in acute distress. Pulmonary:     Effort: Pulmonary effort is normal.  Abdominal:     General: There is no distension.     Palpations: Abdomen is soft.     Tenderness: There is no abdominal tenderness. There is no rebound.  Musculoskeletal:        General: No swelling. Normal range of motion.  Skin:    General: Skin is warm and dry.     Findings: No rash.  Neurological:     Mental Status: She is alert and oriented to person, place, and time.  Psychiatric:        Mood and Affect: Mood normal.        Behavior: Behavior normal.   LE: 2+ edema  GU / Detailed Urogynecologic Evaluation:  Pelvic Exam: Normal external female genitalia; Bartholin's and Skene's glands normal in appearance; urethral meatus normal in appearance, no urethral masses or discharge.   CST: negative  Speculum exam reveals normal vaginal mucosa with atrophy. Cervix normal appearance. Uterus normal single, nontender. Adnexa no mass, fullness, tenderness.    Pelvic floor strength II/V  Pelvic floor musculature: Right levator non-tender, Right obturator non-tender, Left levator non-tender, Left obturator non-tender  POP-Q:   POP-Q  -3                                            Aa   -3                                           Ba  -7                                              C   2  Gh  4                                            Pb  9                                             tvl   -2                                            Ap  -2                                            Bp  -8                                              D     Rectal Exam:  Normal external rectum  Post-Void Residual (PVR) by Bladder Scan: In order to evaluate bladder emptying, we discussed obtaining a postvoid residual and she agreed to this procedure.  Procedure: The ultrasound unit was placed on the patient's abdomen in the suprapubic region after the patient had voided. A PVR of 31 ml was obtained by bladder scan.  Laboratory Results: POC urine: negative  ASSESSMENT AND PLAN Ms. Brassell is a 81 y.o. with:  1. Overactive bladder   2. Urinary frequency    We discussed the symptoms of overactive bladder (OAB), which include urinary urgency, urinary frequency, nocturia, with or without urge incontinence.  While we do not know the exact etiology of OAB, several treatment options exist. We discussed management including behavioral therapy (decreasing bladder irritants, urge suppression strategies, timed voids, bladder retraining), physical therapy, medication; for refractory cases posterior tibial nerve stimulation, sacral neuromodulation, and intravesical botulinum toxin injection.  - She has not seen any benefit from myrbetriq. Not a candidate for anticholinergics due to age and memory issues. Therefore, prescribed Gemtesa 75mg , will obtain prior auth if needed.  - If she does not see improvement with , we discussed PTNS. She has some lower extremity swelling which may limit this treatment.    Leslye Peer, MD   Medical Decision Making:  - Reviewed/ ordered a clinical laboratory test - Review and summation of prior records

## 2021-06-19 ENCOUNTER — Encounter: Payer: Self-pay | Admitting: Obstetrics and Gynecology

## 2021-06-19 ENCOUNTER — Ambulatory Visit (INDEPENDENT_AMBULATORY_CARE_PROVIDER_SITE_OTHER): Payer: Medicare Other | Admitting: Obstetrics and Gynecology

## 2021-06-19 ENCOUNTER — Ambulatory Visit (INDEPENDENT_AMBULATORY_CARE_PROVIDER_SITE_OTHER): Payer: Medicare Other

## 2021-06-19 ENCOUNTER — Other Ambulatory Visit: Payer: Self-pay

## 2021-06-19 VITALS — BP 147/80 | HR 57 | Wt 192.0 lb

## 2021-06-19 DIAGNOSIS — N3281 Overactive bladder: Secondary | ICD-10-CM

## 2021-06-19 DIAGNOSIS — Z7901 Long term (current) use of anticoagulants: Secondary | ICD-10-CM

## 2021-06-19 DIAGNOSIS — R350 Frequency of micturition: Secondary | ICD-10-CM

## 2021-06-19 LAB — POCT URINALYSIS DIPSTICK
Appearance: NORMAL
Bilirubin, UA: NEGATIVE
Blood, UA: NEGATIVE
Glucose, UA: NEGATIVE
Ketones, UA: NEGATIVE
Leukocytes, UA: NEGATIVE
Nitrite, UA: NEGATIVE
Protein, UA: NEGATIVE
Spec Grav, UA: 1.025 (ref 1.010–1.025)
Urobilinogen, UA: 0.2 E.U./dL
pH, UA: 5.5 (ref 5.0–8.0)

## 2021-06-19 LAB — POCT INR: INR: 2.4 (ref 2.0–3.0)

## 2021-06-19 MED ORDER — WARFARIN SODIUM 2 MG PO TABS: ORAL_TABLET | ORAL | 1 refills | Status: DC

## 2021-06-19 MED ORDER — VIBEGRON 75 MG PO TABS: 1.0000 | ORAL_TABLET | Freq: Every day | ORAL | 5 refills | Status: DC

## 2021-06-19 MED ORDER — WARFARIN SODIUM 2 MG PO TABS
ORAL_TABLET | ORAL | 1 refills | Status: DC
Start: 2021-06-19 — End: 2021-06-19

## 2021-06-19 MED ORDER — WARFARIN SODIUM 1 MG PO TABS: ORAL_TABLET | ORAL | 1 refills | Status: DC

## 2021-06-19 NOTE — Patient Instructions (Addendum)
Pre visit review using our clinic review tool, if applicable. No additional management support is needed unless otherwise documented below in the visit note.  Advised to continue taking 4mg daily except take 2mg on Wednesdays and recheck in one week.  

## 2021-06-19 NOTE — Telephone Encounter (Signed)
[facility] called in has some questions about her coudmin dosage

## 2021-06-19 NOTE — Telephone Encounter (Signed)
Pts daughter called and left VM reporting she thinks the pharmacy still has the script for the 2mg  warfarin incorrect and asked for this nurse to contact them.  Contacted pharmacy and advised of script for 2 mg to take two tablets by mouth daily except take 1 tablet on Wed or as directed by anticoagulation clinic.  Resent script for 2mg  tablets.

## 2021-06-19 NOTE — Addendum Note (Signed)
Addended by: Sherrie George on: 06/19/2021 10:55 AM   Modules accepted: Orders

## 2021-06-19 NOTE — Telephone Encounter (Signed)
Mallory Estrada at [facility] pharmacy to d/c prior warfarin prescription sent on 05/13/21 by CMA, for 1mg  and 2mg  dose and that new scripts would be sent with new sigs.  Mallory Estrada verbalized understanding and said she would d/c prior scripts from 05/13/21.  Sent in new scripts for 1 and 2mg  doses of warfarin with correct sigs.

## 2021-06-19 NOTE — Addendum Note (Signed)
Addended by: Sherrie George on: 06/19/2021 12:33 PM   Modules accepted: Orders

## 2021-06-19 NOTE — Telephone Encounter (Signed)
Contacted pt's daughter concerning refills of warfarin. She reported tp does not use 1mg  tablets any longer and only used 2 mg tablets. Advised the 1mg  tablets would be d/c. Advised if any changes to contact office but to continue normal dosing. [name] verbalized understanding.   Scripts updated.

## 2021-06-21 NOTE — Progress Notes (Signed)
Agree. Thanks

## 2021-06-25 ENCOUNTER — Other Ambulatory Visit: Payer: Self-pay

## 2021-06-25 ENCOUNTER — Ambulatory Visit (INDEPENDENT_AMBULATORY_CARE_PROVIDER_SITE_OTHER): Payer: Medicare Other

## 2021-06-25 ENCOUNTER — Ambulatory Visit (INDEPENDENT_AMBULATORY_CARE_PROVIDER_SITE_OTHER): Payer: Medicare Other | Admitting: Family Medicine

## 2021-06-25 ENCOUNTER — Encounter: Payer: Self-pay | Admitting: Family Medicine

## 2021-06-25 DIAGNOSIS — N390 Urinary tract infection, site not specified: Secondary | ICD-10-CM

## 2021-06-25 DIAGNOSIS — Z7901 Long term (current) use of anticoagulants: Secondary | ICD-10-CM | POA: Diagnosis not present

## 2021-06-25 LAB — POCT INR: INR: 2.4 (ref 2.0–3.0)

## 2021-06-25 NOTE — Progress Notes (Signed)
Medical screening examination/treatment/procedure(s) were performed by non-physician practitioner and as supervising physician I was immediately available for consultation/collaboration. I agree with above. Shaylon Aden, MD   

## 2021-06-25 NOTE — Patient Instructions (Addendum)
Pre visit review using our clinic review tool, if applicable. No additional management support is needed unless otherwise documented below in the visit note.  Continue taking 4mg  daily except take 2mg  on Wednesdays and recheck in one week.

## 2021-06-26 ENCOUNTER — Encounter: Payer: Self-pay | Admitting: Family Medicine

## 2021-06-26 NOTE — Progress Notes (Signed)
   Subjective:    Patient ID: Mallory Estrada is a 80 y.o. female presenting with estring   on 06/25/2021  HPI: Here for estring replacement. On Cyclic prometrium q 3 months. Has seen uroGyn and is trying some new meds.  Review of Systems  Constitutional:  Negative for chills and fever.  Respiratory:  Negative for shortness of breath.   Cardiovascular:  Negative for chest pain.  Gastrointestinal:  Negative for abdominal pain, nausea and vomiting.  Genitourinary:  Negative for dysuria.  Skin:  Negative for rash.     Objective:    BP 128/78   Pulse (!) 53  Physical Exam Constitutional:      General: She is not in acute distress.    Appearance: She is well-developed.  HENT:     Head: Normocephalic and atraumatic.  Eyes:     General: No scleral icterus. Cardiovascular:     Rate and Rhythm: Normal rate.  Pulmonary:     Effort: Pulmonary effort is normal.  Abdominal:     Palpations: Abdomen is soft.  Musculoskeletal:     Cervical back: Neck supple.  Skin:    General: Skin is warm and dry.  Neurological:     Mental Status: She is alert and oriented to person, place, and time.   Estring removed and replaced with new one.     Assessment & Plan:   Problem List Items Addressed This Visit       Unprioritized   Recurrent UTI    On Estring--will continue.       Return in about 3 months (around 09/25/2021) for replacement.  Reva Bores 06/26/2021 2:18 PM

## 2021-06-26 NOTE — Assessment & Plan Note (Signed)
On Estring--will continue.

## 2021-07-01 ENCOUNTER — Ambulatory Visit (INDEPENDENT_AMBULATORY_CARE_PROVIDER_SITE_OTHER): Payer: Medicare Other | Admitting: Nurse Practitioner

## 2021-07-01 ENCOUNTER — Other Ambulatory Visit: Payer: Self-pay

## 2021-07-01 ENCOUNTER — Telehealth: Payer: Self-pay | Admitting: Family Medicine

## 2021-07-01 ENCOUNTER — Encounter: Payer: Self-pay | Admitting: Nurse Practitioner

## 2021-07-01 VITALS — BP 126/78 | HR 54 | Temp 97.3°F | Ht 65.0 in | Wt 191.0 lb

## 2021-07-01 DIAGNOSIS — R350 Frequency of micturition: Secondary | ICD-10-CM

## 2021-07-01 DIAGNOSIS — N390 Urinary tract infection, site not specified: Secondary | ICD-10-CM | POA: Insufficient documentation

## 2021-07-01 DIAGNOSIS — R319 Hematuria, unspecified: Secondary | ICD-10-CM

## 2021-07-01 LAB — POCT URINALYSIS DIP (MANUAL ENTRY)
Bilirubin, UA: NEGATIVE
Glucose, UA: NEGATIVE mg/dL
Ketones, POC UA: NEGATIVE mg/dL
Nitrite, UA: NEGATIVE
Protein Ur, POC: NEGATIVE mg/dL
Spec Grav, UA: 1.025 (ref 1.010–1.025)
Urobilinogen, UA: 0.2 E.U./dL
pH, UA: 5.5 (ref 5.0–8.0)

## 2021-07-01 MED ORDER — CEPHALEXIN 500 MG PO CAPS: 500.0000 mg | ORAL_CAPSULE | Freq: Two times a day (BID) | ORAL | 0 refills | Status: AC

## 2021-07-01 NOTE — Telephone Encounter (Signed)
Called and spoke to patient's daughter and was advised that her mom is having some urgency and pressure. Appointment scheduled with Rosita Fire today 07/01/21 at 3:00. Negative covid screening.

## 2021-07-01 NOTE — Progress Notes (Signed)
Acute Office Visit  Subjective:    Patient ID: Mallory Estrada, female    DOB: 1941-04-22, 80 y.o.   MRN: [id number]  Chief Complaint  Patient presents with   Urinary Tract Infection    HPI Patient is in today for urinary complaint: Long history of UTI Symptoms started yesterday. She is experiencing urgency, frequency, and possibly inadequate emptying. Lower stomach cramping.  Patient states that she has normal BMs. Last one was today. Past Medical History:  Diagnosis Date   Arthritis    Atrial fibrillation (HCC)    Noted postop.   Depression    Gait abnormality    Hyperlipidemia    Hypothyroidism    Ophthalmoplegic migraine    SUI (stress urinary incontinence, female)     Past Surgical History:  Procedure Laterality Date   BACK SURGERY     BREAST BIOPSY     HIP SURGERY Bilateral    KNEE SURGERY Bilateral    OOPHORECTOMY Left    TONSILLECTOMY AND ADENOIDECTOMY      Family History  Problem Relation Age of Onset   Arthritis Mother    Arthritis Father    Diabetes Father    Heart disease Father    Arthritis Sister    Heart disease Brother    Colon cancer Brother    Heart attack Brother    Breast cancer Maternal Aunt     Social History   Socioeconomic History   Marital status: Widowed    Spouse name: Not on file   Number of children: Not on file   Years of education: Not on file   Highest education level: Not on file  Occupational History   Not on file  Tobacco Use   Smoking status: Former    Types: Cigarettes    Quit date: 11/01/1970    Years since quitting: 50.6   Smokeless tobacco: Never  Vaping Use   Vaping Use: Never used  Substance and Sexual Activity   Alcohol use: Yes   Drug use: Never   Sexual activity: Not Currently  Other Topics Concern   Not on file  Social History Narrative   From [place] to [facility] 2022    Insurance account manager, principal, 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     Widowed.   Attending college in [employer]   Social  Determinants of Health   Financial Resource Strain: Not on file  Food Insecurity: Not on file  Transportation Needs: Not on file  Physical Activity: Not on file  Stress: Not on file  Social Connections: Not on file  Intimate Partner Violence: Not on file    Outpatient Medications Prior to Visit  Medication Sig Dispense Refill   atorvastatin (LIPITOR) 10 MG tablet Take 10 mg by mouth daily.     Cholecalciferol (VITAMIN D3) 125 MCG (5000 UT) CAPS Take by mouth.     cyclobenzaprine (FLEXERIL) 5 MG tablet Take 1 tablet (5 mg total) by mouth 3 (three) times daily as needed for muscle spasms (sedation caution). 90 tablet 1   estradiol (ESTRING) 2 MG vaginal ring Place 2 mg vaginally every 3 (three) months. follow package directions     furosemide (LASIX) 20 MG tablet NEW PRESCRIPTION REQUEST: TAKE TWO TABLETS BY MOUTH EVERY MONDAY, WEDNES, FRIDAY 72 tablet 3   levothyroxine (SYNTHROID) 50 MCG tablet NEW PRESCRIPTION REQUEST: TAKE ONE TABLET BY MOUTH 30 MINUTES BEFORE BREAKFAST 90 tablet 3   magnesium gluconate (MAGONATE) 500 MG tablet Take 500 mg by mouth 2 (two) times  daily.     Melatonin 1 MG CHEW Chew by mouth.     metoprolol succinate (TOPROL XL) 25 MG 24 hr tablet Take 1 tablet (25 mg total) by mouth daily. 90 tablet 0   potassium chloride SA (KLOR-CON) 20 MEQ tablet NEW PRESCRIPTION REQUEST: TAKE ONE TABLET BY MOUTH EVERY DAY 90 tablet 3   progesterone (PROMETRIUM) 100 MG capsule Take 1 capsule (100 mg total) by mouth daily for 10 days. Take for 10 days q 3 months 40 capsule 1   Vibegron 75 MG TABS Take 1 tablet by mouth daily. 30 tablet 5   warfarin (COUMADIN) 2 MG tablet TAKE TWO TABLETS BY MOUTH DAILY EXCEPT TAKE 1 TABLET ON WEDNESDAYS OR AS DIRECTED BY ANTICOAGULATION CLINIC 180 tablet 1   warfarin (COUMADIN) 2 MG tablet TAKE TWO TABLETS BY MOUTH DAILY EXCEPT TAKE 1TABLET ON WEDNESDAYS OR AS DIRECTED BY ANTICOAGULATION CLINIC 180 tablet 1   No facility-administered medications prior  to visit.    Allergies  Allergen Reactions   Fentanyl     Nausea/vomiting.  Can tolerate dilaudid   Ketamine     intolerant   Macrobid [Nitrofurantoin]    Sulfa Antibiotics     swelling    Review of Systems  Constitutional:  Negative for chills and fever.  Respiratory:  Negative for shortness of breath.   Cardiovascular:  Negative for chest pain.  Gastrointestinal:  Negative for abdominal pain, diarrhea, nausea and vomiting.  Genitourinary:  Positive for frequency and urgency. Negative for decreased urine volume, difficulty urinating, dysuria and hematuria.       Incomplete emptying+  Musculoskeletal:  Negative for back pain.      Objective:    Physical Exam Vitals and nursing note reviewed.  Constitutional:      Appearance: Normal appearance.  Cardiovascular:     Rate and Rhythm: Normal rate and regular rhythm.     Heart sounds: Normal heart sounds.  Pulmonary:     Effort: Pulmonary effort is normal.     Breath sounds: Normal breath sounds.  Abdominal:     General: Bowel sounds are normal. There is no distension.     Palpations: Abdomen is soft.     Tenderness: There is no abdominal tenderness. There is no right CVA tenderness or left CVA tenderness.  Neurological:     Mental Status: She is alert.  Psychiatric:        Mood and Affect: Mood normal.        Behavior: Behavior normal.        Thought Content: Thought content normal.        Judgment: Judgment normal.    Pulse (!) 54   Temp (!) 97.3 F (36.3 C) (Temporal)   Ht 5\' 5"  (1.651 m)   Wt 191 lb (86.6 kg)   SpO2 96%   BMI 31.78 kg/m  Wt Readings from Last 3 Encounters:  07/01/21 191 lb (86.6 kg)  06/19/21 192 lb (87.1 kg)  02/26/21 192 lb (87.1 kg)    Health Maintenance Due  Topic Date Due   Hepatitis C Screening  Never done   TETANUS/TDAP  Never done   Zoster Vaccines- Shingrix (1 of 2) Never done   DEXA SCAN  Never done   PNA vac Low Risk Adult (1 of 2 - PCV13) Never done   COVID-19 Vaccine  (4 - Booster for Pfizer series) 12/14/2020   INFLUENZA VACCINE  06/01/2021    There are no preventive care reminders to display for this  patient.   Lab Results  Component Value Date   TSH 2.55 01/13/2021   Lab Results  Component Value Date   WBC 4.6 01/13/2021   HGB 12.8 01/13/2021   HCT 37.5 01/13/2021   MCV 90.8 01/13/2021   PLT 185.0 01/13/2021   Lab Results  Component Value Date   NA 142 01/13/2021   K 4.3 01/13/2021   CO2 25 01/13/2021   GLUCOSE 97 01/13/2021   BUN 39 (H) 01/13/2021   CREATININE 1.27 (H) 01/13/2021   BILITOT 0.5 01/13/2021   ALKPHOS 55 01/13/2021   AST 17 01/13/2021   ALT 8 01/13/2021   PROT 6.9 01/13/2021   ALBUMIN 4.2 01/13/2021   CALCIUM 9.8 01/13/2021   GFR 40.39 (L) 01/13/2021   Lab Results  Component Value Date   CHOL 143 01/13/2021   Lab Results  Component Value Date   HDL 49.00 01/13/2021   Lab Results  Component Value Date   LDLCALC 71 01/13/2021   Lab Results  Component Value Date   TRIG 114.0 01/13/2021   Lab Results  Component Value Date   CHOLHDL 3 01/13/2021   No results found for: HGBA1C     Assessment & Plan:   Problem List Items Addressed This Visit       Genitourinary   Recurrent UTI    Patient has a longstanding history of recurrent urinary tract infections.  States she used to have them very often at a younger age.  Recently established with Urogyn.  This is the second urinary tract infection this year.      Relevant Medications   cephALEXin (KEFLEX) 500 MG capsule   Urinary tract infection with hematuria    UA in office showed leukocytes and red blood cells.  Given patient's history and symptomology will elect to treat with Keflex.  Patient had culture back in April that showed sensitive to cephalosporins.  Patient is on warfarin but does INR checks at home every week.  We will send off for microscopy and urine culture.  Start Keflex 500 mg twice daily for 10 days.  In the past patient had to have 14  days of Keflex in order to clear her urinary tract infection.  Told patient and daughter we will start with 10 day supply      Relevant Medications   cephALEXin (KEFLEX) 500 MG capsule   Other Relevant Orders   Urine Culture   Urine Microscopic   Other Visit Diagnoses     Frequency of urination    -  Primary   Relevant Orders   POCT urinalysis dipstick (Completed)        No orders of the defined types were placed in this encounter.  This visit occurred during the SARS-CoV-2 public health emergency.  Safety protocols were in place, including screening questions prior to the visit, additional usage of staff PPE, and extensive cleaning of exam room while observing appropriate contact time as indicated for disinfecting solutions.   Audria Nine, NP

## 2021-07-01 NOTE — Telephone Encounter (Signed)
I am out of office today.  Please triage patient and see what is available at the clinic.  Thanks.

## 2021-07-01 NOTE — Assessment & Plan Note (Signed)
UA in office showed leukocytes and red blood cells.  Given patient's history and symptomology will elect to treat with Keflex.  Patient had culture back in April that showed sensitive to cephalosporins.  Patient is on warfarin but does INR checks at home every week.  We will send off for microscopy and urine culture.  Start Keflex 500 mg twice daily for 10 days.  In the past patient had to have 14 days of Keflex in order to clear her urinary tract infection.  Told patient and daughter we will start with 10 day supply

## 2021-07-01 NOTE — Assessment & Plan Note (Signed)
Patient has a longstanding history of recurrent urinary tract infections.  States she used to have them very often at a younger age.  Recently established with Urogyn.  This is the second urinary tract infection this year.

## 2021-07-01 NOTE — Telephone Encounter (Signed)
Mrs. [name] called in for her mom Mrs. Devincenzi due to she stated that her mom stated that she is having early symptoms of a UTI and wanted to know if she can get a script for [facility] or see her in office

## 2021-07-01 NOTE — Telephone Encounter (Signed)
Thanks

## 2021-07-01 NOTE — Patient Instructions (Signed)
Nice to see you today.  Will give you 10 days worth of antibiotics.  Follow up if symptoms worsen or fail to improve

## 2021-07-02 ENCOUNTER — Ambulatory Visit (INDEPENDENT_AMBULATORY_CARE_PROVIDER_SITE_OTHER): Payer: Medicare Other

## 2021-07-02 DIAGNOSIS — Z7901 Long term (current) use of anticoagulants: Secondary | ICD-10-CM

## 2021-07-02 LAB — URINALYSIS, MICROSCOPIC ONLY

## 2021-07-02 LAB — POCT INR: INR: 2.4 (ref 2.0–3.0)

## 2021-07-02 NOTE — Progress Notes (Signed)
Medical screening examination/treatment/procedure(s) were performed by non-physician practitioner and as supervising physician I was immediately available for consultation/collaboration. I agree with above. Roi Jafari, MD   

## 2021-07-02 NOTE — Patient Instructions (Addendum)
Pre visit review using our clinic review tool, if applicable. No additional management support is needed unless otherwise documented below in the visit note.  Continue taking 4mg daily except take 2mg on Wednesdays and recheck in one week. 

## 2021-07-03 LAB — URINE CULTURE
MICRO NUMBER:: 12316086
SPECIMEN QUALITY:: ADEQUATE

## 2021-07-09 ENCOUNTER — Ambulatory Visit (INDEPENDENT_AMBULATORY_CARE_PROVIDER_SITE_OTHER): Payer: Medicare Other

## 2021-07-09 DIAGNOSIS — Z7901 Long term (current) use of anticoagulants: Secondary | ICD-10-CM

## 2021-07-09 LAB — POCT INR: INR: 2.6 (ref 2.0–3.0)

## 2021-07-09 NOTE — Patient Instructions (Addendum)
Pre visit review using our clinic review tool, if applicable. No additional management support is needed unless otherwise documented below in the visit note.  Continue taking 4mg daily except take 2mg on Wednesdays and recheck in one week. 

## 2021-07-12 NOTE — Progress Notes (Signed)
Agree. Thanks

## 2021-07-15 ENCOUNTER — Other Ambulatory Visit: Payer: Self-pay | Admitting: Cardiovascular Disease

## 2021-07-16 ENCOUNTER — Ambulatory Visit (INDEPENDENT_AMBULATORY_CARE_PROVIDER_SITE_OTHER): Payer: Medicare Other

## 2021-07-16 DIAGNOSIS — Z7901 Long term (current) use of anticoagulants: Secondary | ICD-10-CM

## 2021-07-16 LAB — POCT INR: INR: 1.9 — AB (ref 2.0–3.0)

## 2021-07-16 NOTE — Patient Instructions (Addendum)
Pre visit review using our clinic review tool, if applicable. No additional management support is needed unless otherwise documented below in the visit note.  Increase dose today to 6mg  and then continue taking 4mg  daily except take 2mg  on Wednesdays and recheck in one week.

## 2021-07-17 NOTE — Progress Notes (Signed)
Agree. Thanks

## 2021-07-23 ENCOUNTER — Ambulatory Visit (INDEPENDENT_AMBULATORY_CARE_PROVIDER_SITE_OTHER): Payer: Medicare Other

## 2021-07-23 DIAGNOSIS — Z7901 Long term (current) use of anticoagulants: Secondary | ICD-10-CM

## 2021-07-23 LAB — POCT INR: INR: 2.5 (ref 2.0–3.0)

## 2021-07-23 NOTE — Patient Instructions (Addendum)
Pre visit review using our clinic review tool, if applicable. No additional management support is needed unless otherwise documented below in the visit note.  Continue taking 4mg daily except take 2mg on Wednesdays and recheck in one week. 

## 2021-07-30 ENCOUNTER — Ambulatory Visit (INDEPENDENT_AMBULATORY_CARE_PROVIDER_SITE_OTHER): Payer: Medicare Other

## 2021-07-30 DIAGNOSIS — Z7901 Long term (current) use of anticoagulants: Secondary | ICD-10-CM

## 2021-07-30 LAB — POCT INR: INR: 2.4 (ref 2.0–3.0)

## 2021-07-30 NOTE — Patient Instructions (Addendum)
Pre visit review using our clinic review tool, if applicable. No additional management support is needed unless otherwise documented below in the visit note.  Continue to take 4 mg daily except take 2mg  on Wednesdays and recheck in one week.

## 2021-07-31 NOTE — Progress Notes (Signed)
Agree. Thanks

## 2021-08-02 NOTE — Progress Notes (Signed)
Agree. Thanks

## 2021-08-06 ENCOUNTER — Ambulatory Visit (INDEPENDENT_AMBULATORY_CARE_PROVIDER_SITE_OTHER): Payer: Medicare Other

## 2021-08-06 DIAGNOSIS — Z7901 Long term (current) use of anticoagulants: Secondary | ICD-10-CM | POA: Diagnosis not present

## 2021-08-06 LAB — POCT INR: INR: 3.2 — AB (ref 2.0–3.0)

## 2021-08-06 NOTE — Patient Instructions (Signed)
Pre visit review using our clinic review tool, if applicable. No additional management support is needed unless otherwise documented below in the visit note.  Reduce dose today to take 2 mg and then continue to take 4 mg daily except take 2mg  on Wednesdays and recheck in 1 week.

## 2021-08-06 NOTE — Progress Notes (Signed)
Medical screening examination/treatment/procedure(s) were performed by non-physician practitioner and as supervising physician I was immediately available for consultation/collaboration. I agree with above. Garnett Rekowski, MD   

## 2021-08-10 ENCOUNTER — Ambulatory Visit: Payer: Medicare Other | Admitting: Obstetrics and Gynecology

## 2021-08-14 ENCOUNTER — Ambulatory Visit (INDEPENDENT_AMBULATORY_CARE_PROVIDER_SITE_OTHER): Payer: Medicare Other

## 2021-08-14 ENCOUNTER — Encounter: Payer: Self-pay | Admitting: Radiology

## 2021-08-14 DIAGNOSIS — Z7901 Long term (current) use of anticoagulants: Secondary | ICD-10-CM

## 2021-08-14 LAB — POCT INR: INR: 3 (ref 2.0–3.0)

## 2021-08-14 NOTE — Patient Instructions (Addendum)
Pre visit review using our clinic review tool, if applicable. No additional management support is needed unless otherwise documented below in the visit note.  Continue to take 4 mg daily except take 2mg  on Wednesdays and recheck in 1 week.  Increase intake of greens to prior levels.

## 2021-08-16 NOTE — Progress Notes (Signed)
Agree. Thanks

## 2021-08-20 ENCOUNTER — Ambulatory Visit (INDEPENDENT_AMBULATORY_CARE_PROVIDER_SITE_OTHER): Payer: Medicare Other

## 2021-08-20 DIAGNOSIS — Z7901 Long term (current) use of anticoagulants: Secondary | ICD-10-CM

## 2021-08-20 LAB — POCT INR: INR: 4 — AB (ref 2.0–3.0)

## 2021-08-20 NOTE — Patient Instructions (Addendum)
Pre visit review using our clinic review tool, if applicable. No additional management support is needed unless otherwise documented below in the visit note.  Hold dose today and then change weekly dose to take 4 mg daily except take 2mg  on Mon, Wed, and Fri and recheck in 1 week.

## 2021-08-20 NOTE — Progress Notes (Signed)
Medical treatment/procedure(s) were performed by non-physician practitioner and as supervising physician I was immediately available for consultation/collaboration. I agree with above. Marylouise Mallet A Gedalia Mcmillon, MD  

## 2021-08-21 ENCOUNTER — Ambulatory Visit: Payer: Medicare Other | Admitting: Family Medicine

## 2021-08-24 ENCOUNTER — Ambulatory Visit: Payer: Medicare Other | Admitting: Family Medicine

## 2021-08-27 ENCOUNTER — Ambulatory Visit (INDEPENDENT_AMBULATORY_CARE_PROVIDER_SITE_OTHER): Payer: Medicare Other

## 2021-08-27 DIAGNOSIS — Z7901 Long term (current) use of anticoagulants: Secondary | ICD-10-CM

## 2021-08-27 LAB — POCT INR: INR: 2.1 (ref 2.0–3.0)

## 2021-08-27 NOTE — Patient Instructions (Addendum)
Pre visit review using our clinic review tool, if applicable. No additional management support is needed unless otherwise documented below in the visit note.  Continue 4 mg daily except take 2mg  on Mon, Wed, and Fri and recheck in 1 week.

## 2021-08-27 NOTE — Progress Notes (Signed)
Continue 4 mg daily except take 2mg  on Mon, Wed, and Fri and recheck in 1 week.

## 2021-08-28 ENCOUNTER — Telehealth: Payer: Self-pay | Admitting: Family Medicine

## 2021-08-28 NOTE — Telephone Encounter (Signed)
Pt daughter called stating that pt has been taking antibiotics for a UTI for 24hrs, and pt is seeing a little blood. Please advise.

## 2021-08-31 NOTE — Telephone Encounter (Signed)
What antibiotic is she on, how long will she still be on it, and how is she doing at this point?  Please let me know.    I also routed this to [facility] with anticoagulation clinic.  Thanks.

## 2021-08-31 NOTE — Telephone Encounter (Signed)
Noted. Thanks.

## 2021-08-31 NOTE — Telephone Encounter (Addendum)
Pt is taking keflex for UTI. Mallory Estrada, pt's daughter, reports very faint, small amount of pink after urination on the toilet paper on Friday. This only occurred once and has not happened again since. Her last INR check was 10/27 and was 2.1.  The pt was on the abx at that time. Mallory Estrada reports pt has used this abx in the past at least 3 times and it has not affected her INR in any other case. She denies any hematuria now but has not checked the INR today. She is not with pt now but will instruct pt to test INR. She reports pt has apt with PCP tomorrow. Abx was only prescribed for 7 day course and she reports normally it take a 10-14 day course to take care of pts UTIs. She reports pt may be in a cycle now though of causing UTIs due to taking abx so freqently. Advised to test INR and call the report in tomorrow. Advised if significant red blood in pt's urine to take her to the ER or UC. Gave ER precautions. [name] verbalized understanding.   No dosage changes have been made to pt's warfarin.

## 2021-09-01 ENCOUNTER — Encounter: Payer: Self-pay | Admitting: Family Medicine

## 2021-09-01 ENCOUNTER — Ambulatory Visit (INDEPENDENT_AMBULATORY_CARE_PROVIDER_SITE_OTHER): Payer: Medicare Other | Admitting: Family Medicine

## 2021-09-01 ENCOUNTER — Telehealth: Payer: Self-pay | Admitting: *Deleted

## 2021-09-01 ENCOUNTER — Other Ambulatory Visit: Payer: Self-pay

## 2021-09-01 ENCOUNTER — Ambulatory Visit (INDEPENDENT_AMBULATORY_CARE_PROVIDER_SITE_OTHER): Payer: Medicare Other

## 2021-09-01 VITALS — BP 124/78 | HR 56 | Temp 97.5°F | Ht 65.0 in | Wt 190.0 lb

## 2021-09-01 DIAGNOSIS — N390 Urinary tract infection, site not specified: Secondary | ICD-10-CM | POA: Diagnosis not present

## 2021-09-01 DIAGNOSIS — Z659 Problem related to unspecified psychosocial circumstances: Secondary | ICD-10-CM

## 2021-09-01 DIAGNOSIS — Z23 Encounter for immunization: Secondary | ICD-10-CM

## 2021-09-01 DIAGNOSIS — Z7901 Long term (current) use of anticoagulants: Secondary | ICD-10-CM

## 2021-09-01 DIAGNOSIS — M25519 Pain in unspecified shoulder: Secondary | ICD-10-CM

## 2021-09-01 DIAGNOSIS — M79676 Pain in unspecified toe(s): Secondary | ICD-10-CM

## 2021-09-01 LAB — POCT INR: INR: 2 (ref 2.0–3.0)

## 2021-09-01 MED ORDER — CEPHALEXIN 500 MG PO CAPS: 500.0000 mg | ORAL_CAPSULE | Freq: Two times a day (BID) | ORAL | 0 refills | Status: DC

## 2021-09-01 NOTE — Progress Notes (Signed)
This visit occurred during the SARS-CoV-2 public health emergency.  Safety protocols were in place, including screening questions prior to the visit, additional usage of staff PPE, and extensive cleaning of exam room while observing appropriate contact time as indicated for disinfecting solutions.  History of cystitis.  Finishing keflex course per outside clinic.  Tomorrow would be last dose of med if rx isn't continued.  D/w pt about prev blood seen in urine.  Recently started to improved.  She had urgency and discomfort.  INR this AM was 2.  Has tolerated Keflex otherwise.  She was asking about taking a mannose supplement to try to prevent UTIs and that should be okay.  No fevers.    B shoulder dec ROM.  She was scheduled for surgery about 3 years ago but that was delayed due to covid.  She is hesitant about sig interventions/surgery.  She does have pain with range of motion and she is asking about options.  I am not enthused about injecting her shoulder given her Coumadin use but I think it would be reasonable to talk to orthopedics.  She agreed.  Referral placed.  R 2nd toe pain, distant history of toe surgery.  Flexible to manipulation but not much volitional ROM at baseline.  Caught on carpet last night.  2nd toe is longer than other toes.  D/w pt about not going barefoot.  That is likely the most effective intervention at this point instead of putting her in a postop shoe that may increase her fall risk.  Mood d/w pt. "Generally speaking I'm doing pretty well."  She was prev in counseling in [place], prior to her move, about 1 time a month.  We talked about getting set up with counseling- she would prefer a female counselor.    Flu shot done at office visit.  Meds, vitals, and allergies reviewed.   ROS: Per HPI unless specifically indicated in ROS section   Nad Ncat Neck supple, no LA Rrr Ctab Abdomen soft.  Nontender.  Extremities well perfused. Right second toe is longer than her other  toes and she has range of motion on manipulation but not much volitional range of motion.  This is her baseline.  No ulceration. She has clearly decreased range of motion of both shoulders with limited forward flexion and also abduction.  She cannot get her arms above 90 degrees.  I deferred further exam because her shoulder exam/range of motion was so abnormal then because were going to refer her to orthopedics anyway.  I limited the exam out of concern for her comfort.  She agreed.  30 minutes were devoted to patient care in this encounter (this includes time spent reviewing the patient's file/history, interviewing and examining the patient, counseling/reviewing plan with patient).

## 2021-09-01 NOTE — Patient Instructions (Addendum)
Let me know if you have blood in urine after finishing the antibiotics.   We'll call about seeing ortho in [place].  I'll check on counseling.  Flu shot today.  Take care.  Glad to see you.

## 2021-09-01 NOTE — Patient Instructions (Addendum)
Pre visit review using our clinic review tool, if applicable. No additional management support is needed unless otherwise documented below in the visit note.  Continue 4 mg daily except take 2mg on Mon, Wed, and Fri and recheck in 1 week.  

## 2021-09-01 NOTE — Telephone Encounter (Signed)
Ordered a refill of estring, order ID # [id number], will arrive in about 7-10 days.

## 2021-09-01 NOTE — Progress Notes (Signed)
Continue 4 mg daily except take 2mg on Mon, Wed, and Fri and recheck in 1 week.  

## 2021-09-01 NOTE — Telephone Encounter (Signed)
INR called in by pt's daughter today of 2.0 Advised no changes in dosing. [name] verbalized understanding.   Advised her normal testing day on Thursday can be skipped this week. Recheck next Thurs. She was on her way to the [facility] clinic for apt with PCP.

## 2021-09-02 DIAGNOSIS — M79676 Pain in unspecified toe(s): Secondary | ICD-10-CM | POA: Insufficient documentation

## 2021-09-02 DIAGNOSIS — Z659 Problem related to unspecified psychosocial circumstances: Secondary | ICD-10-CM | POA: Insufficient documentation

## 2021-09-02 DIAGNOSIS — M25519 Pain in unspecified shoulder: Secondary | ICD-10-CM | POA: Insufficient documentation

## 2021-09-02 HISTORY — DX: Pain in unspecified shoulder: M25.519

## 2021-09-02 HISTORY — DX: Pain in unspecified toe(s): M79.676

## 2021-09-02 NOTE — Assessment & Plan Note (Signed)
With recent UTI, improving on Keflex.  Would extend 1 more week.  INR still normal.  She has tolerated Keflex otherwise.  Okay for outpatient follow-up.

## 2021-09-02 NOTE — Assessment & Plan Note (Signed)
Will refer for counseling.

## 2021-09-02 NOTE — Assessment & Plan Note (Signed)
Refer to orthopedics 

## 2021-09-02 NOTE — Assessment & Plan Note (Signed)
Wearing a shoe and not going barefoot is likely the best option here.  She will update me as needed.

## 2021-09-04 ENCOUNTER — Ambulatory Visit: Payer: Medicare Other

## 2021-09-09 ENCOUNTER — Other Ambulatory Visit: Payer: Self-pay

## 2021-09-09 MED ORDER — FUROSEMIDE 20 MG PO TABS: ORAL_TABLET | ORAL | 3 refills | Status: DC

## 2021-09-10 ENCOUNTER — Ambulatory Visit (INDEPENDENT_AMBULATORY_CARE_PROVIDER_SITE_OTHER): Payer: Medicare Other

## 2021-09-10 DIAGNOSIS — Z7901 Long term (current) use of anticoagulants: Secondary | ICD-10-CM | POA: Diagnosis not present

## 2021-09-10 LAB — POCT INR: INR: 2.3 (ref 2.0–3.0)

## 2021-09-10 NOTE — Patient Instructions (Addendum)
Pre visit review using our clinic review tool, if applicable. No additional management support is needed unless otherwise documented below in the visit note.  Continue 4 mg daily except take 2mg  on Mon, Wed, and Fri and recheck in 1 week.

## 2021-09-10 NOTE — Progress Notes (Signed)
Continue 4 mg daily except take 2mg  on Mon, Wed, and Fri and recheck in 1 week.

## 2021-09-14 ENCOUNTER — Telehealth: Payer: Self-pay | Admitting: *Deleted

## 2021-09-14 DIAGNOSIS — R300 Dysuria: Secondary | ICD-10-CM

## 2021-09-14 NOTE — Telephone Encounter (Signed)
PLEASE NOTE: All timestamps contained within this report are represented as [place] Standard Time. CONFIDENTIALTY NOTICE: This fax transmission is intended only for the addressee. It contains information that is legally privileged, confidential or otherwise protected from use or disclosure. If you are not the intended recipient, you are strictly prohibited from reviewing, disclosing, copying using or disseminating any of this information or taking any action in reliance on or regarding this information. If you have received this fax in error, please notify us immediately by telephone so that we can arrange for its return to [place]. Phone: [phone], Toll-Free: [phone], Fax: [phone] Page: 1 of 1 Call Id: [id number] [facility] Night - Client Nonclinical Telephone Record  [facility] Client [facility] Night - Client Client Site [facility] - Night Physician Raechel Ache - MD Contact Type Call Who Is Calling Patient / Member / Family / Caregiver Caller Name [name] Caller Phone Number [phone] Patient Name Mallory Estrada Patient DOB 1941-03-22 Call Type Message Only Information Provided Reason for Call Request to Schedule Office Appointment Initial Comment caller states her mom just finished her antibiotic on 10/09 for her UTI but feels it may be recurring ... Caller wants her mom to be seen in the office Patient request to speak to RN No Disp. Time Disposition Final User 09/14/2021 7:53:05 AM General Information Provided Yes Tiburcio Pea, Lanette Call Closed By: Evette Doffing Transaction Date/Time: 09/14/2021 7:50:19 AM (ET)

## 2021-09-14 NOTE — Telephone Encounter (Signed)
If the absence of fever, I would hold off any more abx in the meantime and get ucx done (reasonable to do at the OV tomorrow).  If she can collect a sample today and drop off at [place] station, then please proceed.  Thanks.

## 2021-09-14 NOTE — Telephone Encounter (Signed)
Called patients daughter back and notified about below message from Dr. Para March. Advised if they do decide to take Urine sample to [place] station to please call and schedule appt/make aware they are coming to drop off before showing up. [name] verbalized understanding and stated if they can not do that then will do at OV tomorrow.

## 2021-09-14 NOTE — Telephone Encounter (Signed)
Tired to return call and got voicemail. Left a message on voicemail to call the office back.

## 2021-09-14 NOTE — Telephone Encounter (Signed)
Spoke to patient's daughter Mallory Estrada and was advised that her mom has been on an antibiotic for two weeks. Mallory Estrada stated that her mom finished the medication Wednesday. Patient's daughter stated that her mom has started back with symptoms today. Mallory Estrada stated that her mom is having a little pain, urgency and frequency. Mallory Estrada stated that her mom does have an upcoming appointment scheduled with Uro/GYN in the next few weeks. Mallory Estrada stated that her mom is having incontinence. Mallory Estrada stated that she thinks that her mom may need to have a urine culture done and wants her seen in the office if possible.  No appointments available today at the office. Patient scheduled for an office visit at [facility] tomorrow with Dr. Para March 09/15/21 at 12:00 noon. Mallory Estrada  was advised that this message will be sent to Dr. Para March in case he wants to treat prior to appointment.Mallory Estrada was given UC precautions if her mom gets worse before her appointment tomorrow. Pharmacy [facility]. Veterinary surgeon

## 2021-09-15 ENCOUNTER — Telehealth: Payer: Self-pay | Admitting: *Deleted

## 2021-09-15 ENCOUNTER — Encounter: Payer: Self-pay | Admitting: Family Medicine

## 2021-09-15 ENCOUNTER — Ambulatory Visit (INDEPENDENT_AMBULATORY_CARE_PROVIDER_SITE_OTHER): Payer: Medicare Other | Admitting: Family Medicine

## 2021-09-15 ENCOUNTER — Other Ambulatory Visit: Payer: Self-pay

## 2021-09-15 VITALS — BP 122/82 | HR 60 | Temp 97.1°F | Ht 65.0 in | Wt 190.0 lb

## 2021-09-15 DIAGNOSIS — Z7901 Long term (current) use of anticoagulants: Secondary | ICD-10-CM

## 2021-09-15 DIAGNOSIS — R300 Dysuria: Secondary | ICD-10-CM | POA: Diagnosis not present

## 2021-09-15 DIAGNOSIS — N390 Urinary tract infection, site not specified: Secondary | ICD-10-CM

## 2021-09-15 LAB — POC URINALSYSI DIPSTICK (AUTOMATED)
Bilirubin, UA: NEGATIVE
Glucose, UA: NEGATIVE
Ketones, UA: NEGATIVE
Nitrite, UA: NEGATIVE
Protein, UA: POSITIVE — AB
Spec Grav, UA: 1.02 (ref 1.010–1.025)
Urobilinogen, UA: 0.2 E.U./dL
pH, UA: 5.5 (ref 5.0–8.0)

## 2021-09-15 MED ORDER — CEPHALEXIN 500 MG PO CAPS: 500.0000 mg | ORAL_CAPSULE | Freq: Two times a day (BID) | ORAL | 0 refills | Status: DC

## 2021-09-15 MED ORDER — WARFARIN SODIUM 2 MG PO TABS: ORAL_TABLET | ORAL | Status: DC

## 2021-09-15 NOTE — Progress Notes (Signed)
This visit occurred during the SARS-CoV-2 public health emergency.  Safety protocols were in place, including screening questions prior to the visit, additional usage of staff PPE, and extensive cleaning of exam room while observing appropriate contact time as indicated for disinfecting solutions.  Dysuria. Has been off keflex, today is day #6 off abx.  Ucx pending.  We talked about getting a sterile/not contaminated sample. No fevers.  Her initial sample today may have been contaminated.  She does have urinary symptoms.  She has lower abdominal discomfort while urinating.  She prev had clear change in incontinence when symptomatic.  She is still anticoagulated with Coumadin.  She has multiple medication/antibiotic intolerances  Meds, vitals, and allergies reviewed.   ROS: Per HPI unless specifically indicated in ROS section   Nad Ncat Neck supple no LA Rrr Ctab Abd soft, not ttp Ext well perfused.  31 minutes were devoted to patient care in this encounter (this includes time spent reviewing the patient's file/history, interviewing and examining the patient, counseling/reviewing plan with patient).

## 2021-09-15 NOTE — Telephone Encounter (Signed)
Left message to let her know that her mothers Estring is here at the office and she can come pik it up.

## 2021-09-15 NOTE — Patient Instructions (Addendum)
Get a urine sample collected and then start keflex.   We'll go from there.   Take care.  Glad to see you.

## 2021-09-16 LAB — URINALYSIS, ROUTINE W REFLEX MICROSCOPIC
Bilirubin Urine: NEGATIVE
Ketones, ur: NEGATIVE
Nitrite: NEGATIVE
Specific Gravity, Urine: 1.025 (ref 1.000–1.030)
Total Protein, Urine: 30 — AB
Urine Glucose: NEGATIVE
Urobilinogen, UA: 0.2 (ref 0.0–1.0)
pH: 6 (ref 5.0–8.0)

## 2021-09-16 LAB — URINE CULTURE
MICRO NUMBER:: 12639480
Result:: NO GROWTH
SPECIMEN QUALITY:: ADEQUATE

## 2021-09-16 NOTE — Assessment & Plan Note (Signed)
Detailed conversation.  We need to get a noncontaminated sample, especially given her Coumadin use and previous medication intolerances.  When she has her urine sample collected, she can go ahead and start Keflex as she has tolerated that in the past with no significant change to her INR.  We did not adjust her Coumadin today.  Still okay for outpatient follow-up.  She agrees with plan.  Discussed with patient and family.

## 2021-09-17 ENCOUNTER — Ambulatory Visit (INDEPENDENT_AMBULATORY_CARE_PROVIDER_SITE_OTHER): Payer: Medicare Other

## 2021-09-17 DIAGNOSIS — Z7901 Long term (current) use of anticoagulants: Secondary | ICD-10-CM | POA: Diagnosis not present

## 2021-09-17 LAB — POCT INR: INR: 2.2 (ref 2.0–3.0)

## 2021-09-17 NOTE — Patient Instructions (Addendum)
Pre visit review using our clinic review tool, if applicable. No additional management support is needed unless otherwise documented below in the visit note.  Continue 4 mg daily except take 2mg  on Mon, Wed, and Fri and recheck in 1 week.

## 2021-09-17 NOTE — Progress Notes (Addendum)
Continue 4 mg daily except take 2mg  on Mon, Wed, and Fri and recheck in 1 week.  ============== Agree.  Thanks.   Fri

## 2021-09-18 ENCOUNTER — Other Ambulatory Visit: Payer: Self-pay | Admitting: Family Medicine

## 2021-09-18 ENCOUNTER — Telehealth: Payer: Self-pay | Admitting: Family Medicine

## 2021-09-18 MED ORDER — PHENAZOPYRIDINE HCL 100 MG PO TABS: 100.0000 mg | ORAL_TABLET | Freq: Three times a day (TID) | ORAL | 0 refills | Status: DC | PRN

## 2021-09-18 NOTE — Telephone Encounter (Signed)
Spoke with patient and daughter Peggye Fothergill about medication. Nothing further needed.

## 2021-09-18 NOTE — Telephone Encounter (Signed)
Pt daughter calling wanting a return call to discuss medication phenazopyridine (PYRIDIUM) 100 MG tablet.

## 2021-09-24 LAB — POCT INR: INR: 2 (ref 2.0–3.0)

## 2021-09-28 ENCOUNTER — Ambulatory Visit (INDEPENDENT_AMBULATORY_CARE_PROVIDER_SITE_OTHER): Payer: Medicare Other

## 2021-09-28 ENCOUNTER — Telehealth: Payer: Self-pay | Admitting: Family Medicine

## 2021-09-28 DIAGNOSIS — Z7901 Long term (current) use of anticoagulants: Secondary | ICD-10-CM

## 2021-09-28 MED ORDER — METOPROLOL SUCCINATE ER 25 MG PO TB24: 25.0000 mg | ORAL_TABLET | Freq: Every day | ORAL | 2 refills | Status: DC

## 2021-09-28 MED ORDER — ATORVASTATIN CALCIUM 10 MG PO TABS: 10.0000 mg | ORAL_TABLET | Freq: Every day | ORAL | 2 refills | Status: DC

## 2021-09-28 MED ORDER — LEVOTHYROXINE SODIUM 50 MCG PO TABS: ORAL_TABLET | ORAL | 3 refills | Status: DC

## 2021-09-28 NOTE — Telephone Encounter (Signed)
  Encourage patient to contact the pharmacy for refills or they can request refills through [facility]  LAST APPOINTMENT DATE:  Please schedule appointment if longer than 1 year  NEXT APPOINTMENT DATE:  MEDICATION:levothyroxine (SYNTHROID) 50 MCG tablet  atorvastatin (LIPITOR) 10 MG tablet  metoprolol succinate (TOPROL-XL) 25 MG 24 hr tablet   Is the patient out of medication?   PHARMACY:[facility] - [place], [place]    Let patient know to contact pharmacy at the end of the day to make sure medication is ready.  Please notify patient to allow 48-72 hours to process  CLINICAL FILLS OUT ALL BELOW:   LAST REFILL:  QTY:  REFILL DATE:    OTHER COMMENTS:    Okay for refill?  Please advise

## 2021-09-28 NOTE — Patient Instructions (Addendum)
Pre visit review using our clinic review tool, if applicable. No additional management support is needed unless otherwise documented below in the visit note.  Continue 4 mg daily except take 2mg  on Mon, Wed, and Fri and recheck in 1 week.

## 2021-09-28 NOTE — Progress Notes (Signed)
Continue 4 mg daily except take 2mg  on Mon, Wed, and Fri and recheck in 1 week.

## 2021-09-28 NOTE — Telephone Encounter (Signed)
Rxs sent

## 2021-10-01 ENCOUNTER — Ambulatory Visit (INDEPENDENT_AMBULATORY_CARE_PROVIDER_SITE_OTHER): Payer: Medicare Other

## 2021-10-01 DIAGNOSIS — Z7901 Long term (current) use of anticoagulants: Secondary | ICD-10-CM

## 2021-10-01 LAB — POCT INR: INR: 2.4 (ref 2.0–3.0)

## 2021-10-01 NOTE — Patient Instructions (Addendum)
Pre visit review using our clinic review tool, if applicable. No additional management support is needed unless otherwise documented below in the visit note.  Continue 4 mg daily except take 2mg  on Mon, Wed, and Fri and recheck in 1 week.

## 2021-10-01 NOTE — Progress Notes (Signed)
Continue 4 mg daily except take 2mg  on Mon, Wed, and Fri and recheck in 1 week.

## 2021-10-05 ENCOUNTER — Telehealth: Payer: Self-pay | Admitting: Family Medicine

## 2021-10-05 NOTE — Telephone Encounter (Signed)
Daughter [name] called in and stated that her mom fell last weekend and she bumped her head and bruised her elbow. And she stated her core is sore and wanted to get some advice about it.

## 2021-10-05 NOTE — Telephone Encounter (Signed)
If the pain is clearly reproducible and if she isn't in severe pain, then it should be okay to keep the urogyn appointment tomorrow and then go from there/get seen here on another day.  What is she doing for pain in the meantime?

## 2021-10-05 NOTE — Progress Notes (Signed)
[facility] Return Visit  SUBJECTIVE  History of Present Illness: Mallory Estrada is a 80 y.o. female seen in follow-up for overactive bladder and recurrent UTI. Plan at last visit was to start [facility] 75mg  daily. She also wants her estring replaced.   Was feeling some increased urgency but denies burning with urination. That has improved somewhat. Tried some pyridium but did not help with the discomfort with urgency.   was too expensive so she did not get it ($250). Has previously tried Myrbetriq without improvement. Has also tried one other medication (forgot the name).   Past Medical History: Patient  has a past medical history of Arthritis, Atrial fibrillation (HCC), Depression, Gait abnormality, Hyperlipidemia, Hypothyroidism, Ophthalmoplegic migraine, and SUI (stress urinary incontinence, female).   Past Surgical History: She  has a past surgical history that includes Breast biopsy; Tonsillectomy and adenoidectomy; Back surgery; Hip surgery (Bilateral); Knee surgery (Bilateral); and Oophorectomy (Left).   Medications: She has a current medication list which includes the following prescription(s): atorvastatin, cephalexin, vitamin d3, cyclobenzaprine, estring, furosemide, levothyroxine, magnesium gluconate, melatonin, metoprolol succinate, phenazopyridine, potassium chloride sa, warfarin, and progesterone.   Allergies: Patient is allergic to fentanyl, ketamine, macrobid [nitrofurantoin], and sulfa antibiotics.   Social History: Patient  reports that she quit smoking about 50 years ago. Her smoking use included cigarettes. She has never used smokeless tobacco. She reports current alcohol use. She reports that she does not use drugs.      OBJECTIVE     Physical Exam: Vitals:   10/06/21 1310  BP: (!) 156/67  Pulse: (!) 53   Gen: No apparent distress, A&O x 3.  Pelvic exam:  Normal external genitalia. Estring removed digitally and new estring replaced.     ASSESSMENT AND PLAN    Mallory Estrada is a 80 y.o. with:  1. Urinary frequency   2. Urinary urgency   3. Overactive bladder   4. Recurrent UTI    OAB - We discussed the option of PTNS since she failed two other medications previously. She has some LE edema but can still identify landmarks enough for needle placement.  - Will have her fill out a baseline 3 days bladder diary and return for treatment.   2. Recurrent UTI - New estring placed today - Will send new urine for culture. If positive, discussed possibly doing prophylactic antibiotics for 6 months.   77, MD  Time spent: I spent 25 minutes dedicated to the care of this patient on the date of this encounter to include pre-visit review of records, face-to-face time with the patient and post visit documentation and ordering medication/ testing.

## 2021-10-05 NOTE — Telephone Encounter (Signed)
Called and spoke with patient and her daughter [name]; both were together during call. Patient had a bad fall last weekend and did hit her head. She has been okay cognitive wise and daughter has been keeping a check on her. Patient states her core is very sore and has had some chest pain off and on that she says is positional. Patient states that if she moves a certain way she will get a sharp pain in her chest. She will see Urogyn tomorrow at 1 pm. I was going to schedule patient appt but there is nothing until Thursday with you. Are you okay see her then? There are some other openings on Tuesday but daughter states its really hard to get to two appts in one day and wants to avoid that if possible.

## 2021-10-05 NOTE — Telephone Encounter (Signed)
Noted. Thanks.

## 2021-10-05 NOTE — Telephone Encounter (Signed)
Spoke with patient and she is taking tylenol for the pain which helps some. Patient is scheduled to come in on 10/08/21 at 12:00 pm.

## 2021-10-06 ENCOUNTER — Other Ambulatory Visit: Payer: Self-pay

## 2021-10-06 ENCOUNTER — Ambulatory Visit (INDEPENDENT_AMBULATORY_CARE_PROVIDER_SITE_OTHER): Payer: Medicare Other | Admitting: Obstetrics and Gynecology

## 2021-10-06 ENCOUNTER — Encounter: Payer: Self-pay | Admitting: Obstetrics and Gynecology

## 2021-10-06 ENCOUNTER — Other Ambulatory Visit (HOSPITAL_COMMUNITY)
Admission: RE | Admit: 2021-10-06 | Discharge: 2021-10-06 | Disposition: A | Discharge status: Home / Self Care | Payer: Medicare Other | Source: Ambulatory Visit | Attending: Obstetrics and Gynecology | Admitting: Obstetrics and Gynecology

## 2021-10-06 VITALS — BP 156/67 | HR 53

## 2021-10-06 DIAGNOSIS — R3915 Urgency of urination: Secondary | ICD-10-CM | POA: Insufficient documentation

## 2021-10-06 DIAGNOSIS — N3281 Overactive bladder: Secondary | ICD-10-CM

## 2021-10-06 DIAGNOSIS — R350 Frequency of micturition: Secondary | ICD-10-CM

## 2021-10-06 DIAGNOSIS — N390 Urinary tract infection, site not specified: Secondary | ICD-10-CM | POA: Diagnosis not present

## 2021-10-06 LAB — POCT URINALYSIS DIPSTICK
Appearance: ABNORMAL
Bilirubin, UA: NEGATIVE
Glucose, UA: NEGATIVE
Ketones, UA: NEGATIVE
Nitrite, UA: NEGATIVE
Protein, UA: POSITIVE — AB
Spec Grav, UA: 1.025 (ref 1.010–1.025)
Urobilinogen, UA: 0.2 E.U./dL
pH, UA: 5.5 (ref 5.0–8.0)

## 2021-10-08 ENCOUNTER — Telehealth: Payer: Self-pay | Admitting: Family Medicine

## 2021-10-08 ENCOUNTER — Ambulatory Visit (INDEPENDENT_AMBULATORY_CARE_PROVIDER_SITE_OTHER): Payer: Medicare Other

## 2021-10-08 ENCOUNTER — Ambulatory Visit: Payer: Medicare Other | Admitting: Family Medicine

## 2021-10-08 DIAGNOSIS — Z7901 Long term (current) use of anticoagulants: Secondary | ICD-10-CM

## 2021-10-08 LAB — POCT INR: INR: 3 (ref 2.0–3.0)

## 2021-10-08 NOTE — Patient Instructions (Addendum)
Pre visit review using our clinic review tool, if applicable. No additional management support is needed unless otherwise documented below in the visit note.  Continue 4 mg daily except take 2mg  on Mon, Wed, and Fri and recheck in 1 week. Pt's daughter does not want AVS mailed.

## 2021-10-08 NOTE — Telephone Encounter (Signed)
Pt daughter called checking status on orders for Orthopedic doctor and Behavioral Health for pt.

## 2021-10-08 NOTE — Telephone Encounter (Signed)
Pt was made aware on 09/04/21 where her referral was being faxed. The ortho Referral was sent to [facility] in [place]. She needs to call them.   Date Time Type Summary User  09/04/2021 3:26 PM EDT General Pt is aware the referral has been faxed  Caryl Bis  Note Text:  Pt is aware the referral has been faxed    Faxed to [phone]     [id number]   Caryl Bis on 09/04/2021 1522 - delivered at 09/04/2021 1522   [facility] Address: [address] [place], [place] [address] Phone: [phone]   -----------------------------------  The Behavioral Health referral was sent within [facility] - we do not schedule for them so she will need to contact then to schedule.    Behavioral Medicine at [facility] Address: [address] [place], [place] [address] Phone: [phone]

## 2021-10-08 NOTE — Progress Notes (Signed)
Continue 4 mg daily except take 2mg  on Mon, Wed, and Fri and recheck in 1 week. Pt's daughter does not want AVS mailed.

## 2021-10-09 LAB — URINE CULTURE: Culture: >=100000 — AB

## 2021-10-09 MED ORDER — CEPHALEXIN 250 MG PO CAPS: 250.0000 mg | ORAL_CAPSULE | Freq: Four times a day (QID) | ORAL | 0 refills | Status: DC

## 2021-10-09 MED ORDER — FOSFOMYCIN TROMETHAMINE 3 G PO PACK: PACK | ORAL | 5 refills | Status: DC

## 2021-10-09 MED ORDER — CEPHALEXIN 250 MG PO CAPS: 250.0000 mg | ORAL_CAPSULE | Freq: Four times a day (QID) | ORAL | 0 refills | Status: AC

## 2021-10-09 NOTE — Addendum Note (Signed)
Addended by: Marguerita Beards on: 10/09/2021 01:21 PM   Modules accepted: Orders

## 2021-10-09 NOTE — Addendum Note (Signed)
Addended by: Marguerita Beards on: 10/09/2021 10:40 AM   Modules accepted: Orders

## 2021-10-09 NOTE — Telephone Encounter (Signed)
Called and gave patients daughter referral information. She will call and get patient set up for appts.

## 2021-10-12 ENCOUNTER — Encounter: Payer: Self-pay | Admitting: Family Medicine

## 2021-10-13 ENCOUNTER — Encounter: Payer: Self-pay | Admitting: Family Medicine

## 2021-10-13 ENCOUNTER — Ambulatory Visit (INDEPENDENT_AMBULATORY_CARE_PROVIDER_SITE_OTHER): Payer: Medicare Other | Admitting: Family Medicine

## 2021-10-13 ENCOUNTER — Other Ambulatory Visit: Payer: Self-pay

## 2021-10-13 ENCOUNTER — Other Ambulatory Visit: Payer: Self-pay | Admitting: Obstetrics and Gynecology

## 2021-10-13 ENCOUNTER — Telehealth: Payer: Self-pay | Admitting: Obstetrics and Gynecology

## 2021-10-13 ENCOUNTER — Ambulatory Visit: Payer: Medicare Other | Admitting: Family Medicine

## 2021-10-13 VITALS — BP 130/80 | HR 46 | Temp 97.5°F | Ht 65.0 in | Wt 194.5 lb

## 2021-10-13 DIAGNOSIS — W19XXXA Unspecified fall, initial encounter: Secondary | ICD-10-CM | POA: Diagnosis not present

## 2021-10-13 DIAGNOSIS — S0990XA Unspecified injury of head, initial encounter: Secondary | ICD-10-CM | POA: Diagnosis not present

## 2021-10-13 DIAGNOSIS — R413 Other amnesia: Secondary | ICD-10-CM

## 2021-10-13 DIAGNOSIS — R0789 Other chest pain: Secondary | ICD-10-CM | POA: Diagnosis not present

## 2021-10-13 DIAGNOSIS — N390 Urinary tract infection, site not specified: Secondary | ICD-10-CM

## 2021-10-13 HISTORY — DX: Other chest pain: R07.89

## 2021-10-13 MED ORDER — FOSFOMYCIN TROMETHAMINE 3 G PO PACK: PACK | ORAL | 5 refills | Status: DC

## 2021-10-13 NOTE — Progress Notes (Signed)
Patient ID: Mallory Estrada, female    DOB: 01/26/1941, 80 y.o.   MRN: [id number]  This visit was conducted in person. BP Readings from Last 3 Encounters:  10/13/21 130/80  10/06/21 (!) 156/67  09/15/21 122/82    Pulse (!) 46    Temp (!) 97.5 F (36.4 C) (Temporal)    Ht 5\' 5"  (1.651 m)    Wt 194 lb 8 oz (88.2 kg)    SpO2 99%    BMI 32.37 kg/m    CC:   Chief Complaint  Patient presents with   Fall    09/27/21-Hit head during fall   Pain in Left Rib Area    Now feels like it is radiating to stomach area    Subjective:   HPI: Mallory Estrada is a 80 y.o. female presenting on 10/13/2021 for Fall (09/27/21-Hit head during fall) and Pain in Left Rib Area (Now feels like it is radiating to stomach area)   Pt reports accidental fall on 09/27/2021, she wash washing hands  slipped on floor/step on wrong part of shoe.   No headache, palpitation, lightheaded, no SOB, no CP.  Usually uses a walker.  No LOC, but hit head on landed on right side.   No confusion after.  No daily headache. No change in mental status above baseline. Had swelling on right scalp.09/29/2021 only a little sore on scalp now.. treated with ice and tylenol.   May have pulled muscles on left chest wall and left abdomen. She currently is having  achy pain in left rib cage, radiates to left abdomen.  No SOB, somewhat deep breath achy.   No fever, no cough   Using tylenol 500 mg 2 tablets as needed daily, now only oc using.     Relevant past medical, surgical, family and social history reviewed and updated as indicated. Interim medical history since our last visit reviewed. Allergies and medications reviewed and updated. Outpatient Medications Prior to Visit  Medication Sig Dispense Refill   atorvastatin (LIPITOR) 10 MG tablet Take 1 tablet (10 mg total) by mouth daily. 90 tablet 2   cephALEXin (KEFLEX) 250 MG capsule Take 1 capsule (250 mg total) by mouth 4 (four) times daily for 5 days. 20 capsule 0    cephALEXin (KEFLEX) 500 MG capsule Take 1 capsule (500 mg total) by mouth 2 (two) times daily. 14 capsule 0   Cholecalciferol (VITAMIN D3) 125 MCG (5000 UT) CAPS Take by mouth.     cyclobenzaprine (FLEXERIL) 5 MG tablet Take 1 tablet (5 mg total) by mouth 3 (three) times daily as needed for muscle spasms (sedation caution). 90 tablet 1   estradiol (ESTRING) 2 MG vaginal ring Place 2 mg vaginally every 3 (three) months. follow package directions     fosfomycin (MONUROL) 3 g PACK Take 1 packet every 10 days. Dissolve packet in 3-4 oz of water and drink immediately. 3 each 5   furosemide (LASIX) 20 MG tablet NEW PRESCRIPTION REQUEST: TAKE TWO TABLETS BY MOUTH EVERY MONDAY, WEDNES, FRIDAY 72 tablet 3   levothyroxine (SYNTHROID) 50 MCG tablet NEW PRESCRIPTION REQUEST: TAKE ONE TABLET BY MOUTH 30 MINUTES BEFORE BREAKFAST 90 tablet 3   magnesium gluconate (MAGONATE) 500 MG tablet Take 500 mg by mouth 2 (two) times daily.     Melatonin 1 MG CHEW Chew by mouth.     metoprolol succinate (TOPROL-XL) 25 MG 24 hr tablet Take 1 tablet (25 mg total) by mouth daily. 90 tablet 2  phenazopyridine (PYRIDIUM) 100 MG tablet Take 1 tablet (100 mg total) by mouth 3 (three) times daily as needed for pain. 10 tablet 0   potassium chloride SA (KLOR-CON) 20 MEQ tablet NEW PRESCRIPTION REQUEST: TAKE ONE TABLET BY MOUTH EVERY DAY 90 tablet 3   warfarin (COUMADIN) 2 MG tablet 2 mg (2 mg x 1) every Mon, Wed, Fri; 4 mg (2 mg x 2) all other days.     progesterone (PROMETRIUM) 100 MG capsule Take 1 capsule (100 mg total) by mouth daily for 10 days. Take for 10 days q 3 months 40 capsule 1   No facility-administered medications prior to visit.     Per HPI unless specifically indicated in ROS section below Review of Systems  Constitutional:  Negative for fatigue and fever.  HENT:  Negative for congestion.   Eyes:  Negative for pain.  Respiratory:  Negative for cough and shortness of breath.   Cardiovascular:  Negative for  chest pain, palpitations and leg swelling.  Gastrointestinal:  Negative for abdominal pain.  Genitourinary:  Negative for dysuria and vaginal bleeding.  Musculoskeletal:  Negative for back pain.  Neurological:  Negative for syncope, light-headedness and headaches.  Psychiatric/Behavioral:  Negative for dysphoric mood.   Objective:  Pulse (!) 46    Temp (!) 97.5 F (36.4 C) (Temporal)    Ht 5\' 5"  (1.651 m)    Wt 194 lb 8 oz (88.2 kg)    SpO2 99%    BMI 32.37 kg/m   Wt Readings from Last 3 Encounters:  10/13/21 194 lb 8 oz (88.2 kg)  09/15/21 190 lb (86.2 kg)  09/01/21 190 lb (86.2 kg)      Physical Exam Constitutional:      General: She is not in acute distress.    Appearance: Normal appearance. She is well-developed. She is not ill-appearing or toxic-appearing.  HENT:     Head: Normocephalic.     Right Ear: Hearing, tympanic membrane, ear canal and external ear normal. Tympanic membrane is not erythematous, retracted or bulging.     Left Ear: Hearing, tympanic membrane, ear canal and external ear normal. Tympanic membrane is not erythematous, retracted or bulging.     Nose: No mucosal edema or rhinorrhea.     Right Sinus: No maxillary sinus tenderness or frontal sinus tenderness.     Left Sinus: No maxillary sinus tenderness or frontal sinus tenderness.     Mouth/Throat:     Pharynx: Uvula midline.  Eyes:     General: Lids are normal. Lids are everted, no foreign bodies appreciated.     Conjunctiva/sclera: Conjunctivae normal.     Pupils: Pupils are equal, round, and reactive to light.  Neck:     Thyroid: No thyroid mass or thyromegaly.     Vascular: No carotid bruit.     Trachea: Trachea normal.  Cardiovascular:     Rate and Rhythm: Normal rate and regular rhythm.     Pulses: Normal pulses.     Heart sounds: Normal heart sounds, S1 normal and S2 normal. No murmur heard.   No friction rub. No gallop.  Pulmonary:     Effort: Pulmonary effort is normal. No tachypnea or  respiratory distress.     Breath sounds: Normal breath sounds. No decreased breath sounds, wheezing, rhonchi or rales.  Chest:  Breasts:    Right: Normal.     Left: Normal.     Comments: Non focal ttp over left chest wall, no contusion/abrasion Abdominal:  General: Bowel sounds are normal.     Palpations: Abdomen is soft.     Tenderness: There is no abdominal tenderness.  Musculoskeletal:     Cervical back: Normal range of motion and neck supple.  Skin:    General: Skin is warm and dry.     Findings: No rash.  Neurological:     Mental Status: She is alert.  Psychiatric:        Mood and Affect: Mood is not anxious or depressed.        Speech: Speech normal.        Behavior: Behavior normal. Behavior is cooperative.        Thought Content: Thought content normal.        Judgment: Judgment normal.      Results for orders placed or performed in visit on 10/08/21  POCT INR  Result Value Ref Range   INR 3.0 2.0 - 3.0    This visit occurred during the SARS-CoV-2 public health emergency.  Safety protocols were in place, including screening questions prior to the visit, additional usage of staff PPE, and extensive cleaning of exam room while observing appropriate contact time as indicated for disinfecting solutions.   COVID 19 screen:  No recent travel or known exposure to COVID19 The patient denies respiratory symptoms of COVID 19 at this time. The importance of social distancing was discussed today.   Assessment and Plan    Problem List Items Addressed This Visit     Accidental fall - Primary    Uses walker, lost balance. No precipitating or associated symptoms       Head trauma    Scalp contusion, healing.  no S/S of intracranial bleeding, no symptoms of concussion.  no indication for head CT.  ER precautions given.      Left-sided chest wall pain    Likely MSK strain vs ess likely rib fracture.  Gradually improving. Treat with heat, deep breaths and tylenol  prn.  No S/S of lung infection.      Memory change    Daughter requesting annual assessment.. records of eval transferred when moved per notes... referred pt back to PCP for further eval and treat.  She is at baseline but has memory issues chronically.        Kerby Nora, MD

## 2021-10-13 NOTE — Assessment & Plan Note (Signed)
Daughter requesting annual assessment.. records of eval transferred when moved per notes... referred pt back to PCP for further eval and treat.  She is at baseline but has memory issues chronically.

## 2021-10-13 NOTE — Patient Instructions (Addendum)
Heat as needed.  Tylenol for pain  Can do gentle stretching exercises.

## 2021-10-13 NOTE — Assessment & Plan Note (Signed)
Scalp contusion, healing.  no S/S of intracranial bleeding, no symptoms of concussion.  no indication for head CT.  ER precautions given.

## 2021-10-13 NOTE — Assessment & Plan Note (Signed)
Likely MSK strain vs ess likely rib fracture.  Gradually improving. Treat with heat, deep breaths and tylenol prn.  No S/S of lung infection.

## 2021-10-13 NOTE — Assessment & Plan Note (Signed)
Uses walker, lost balance. No precipitating or associated symptoms

## 2021-10-16 NOTE — Progress Notes (Signed)
Precert check for PTNS [employer] contacted No Prior auth needed CPT [id number] Dx code: N32.81 Ref # [id number]

## 2021-10-21 DIAGNOSIS — M7542 Impingement syndrome of left shoulder: Secondary | ICD-10-CM | POA: Diagnosis not present

## 2021-10-21 DIAGNOSIS — M75121 Complete rotator cuff tear or rupture of right shoulder, not specified as traumatic: Secondary | ICD-10-CM | POA: Diagnosis not present

## 2021-10-27 ENCOUNTER — Encounter: Payer: Self-pay | Admitting: Family Medicine

## 2021-10-28 ENCOUNTER — Telehealth: Payer: Self-pay | Admitting: Family Medicine

## 2021-10-28 NOTE — Telephone Encounter (Signed)
Noted. Thanks.

## 2021-10-28 NOTE — Telephone Encounter (Signed)
Please call and triage patient about her pain/headache.  Thanks.

## 2021-10-28 NOTE — Telephone Encounter (Signed)
This is pt message sent on 10/27/21  Myra Gianotti to [facility] (supporting Joaquim Nam, MD)   SE    9:31 AM Hello, Its been a week now that [name]) has had a really bad headache. Sometimes its connected to neck and shoulders, sometimes not. Its keeping her from getting restful sleep and shes very uncomfortable during the day.  She has been taking 1000 mg Tylenol 2-3 times a day and it helps take the edge off. Ice compress also seems to help as its been applied but no lasting help. Would you advise any further action? Im going to try more massage but I [name]) really havent gotten to do that for her much so far Thank you   I spoke with [name] (DPR signed) for one wk pt has had consistent H/A; pt has had H/A previously but never this long or this bad. Pt said years ago before moving to [place] she had similar symptoms and pt was told by a nurse that it was related to her neck. Pt is having difficulty sleeping due to H/A. Pt said the H/A is a dull achy pain that moves around her head.Pain level now is 6. No set pattern where the head hurts. No dizziness or lightheadedness.pt does not appear to be experiencing any confusion. Pt usually has a problem with balance but that is not worse than ;usual.  Pt has had some vision changes recently like a double image when looking at something. Pt said the vision issue is worsening. No N&V. Pt has taken tylenol which helps but does not last. Pt fell on 09/28/21 and hit her head above rt ear near top of head. Pt did not lose consciousness. Pt was seen on 10/13/21 for the fall.  Pt does not have BP cuff availability at this time.No available appts at [facility] 10/28/21 or 10/29/21.  Pt does not prefer to go to UC or ED and [name] said their schedule is very flexible and could come in on 10/29/21 at any time if Dr Para March could work pt in. [facility] & ED precautions given and pt and [name] who are both on the phone voiced understanding. [name] will wait for  cb after Dr Para March has reviewed this note. At this time pt does not sound like she is in any distress.

## 2021-10-28 NOTE — Telephone Encounter (Addendum)
I would try flexeril in the meantime, in they haven't already done so.  Sedation caution.   Do we have coverage for 1:30 Thursday, if patient can be added on?  I can room patient if needed.  Thanks.

## 2021-10-28 NOTE — Telephone Encounter (Addendum)
Spoke to daughter, [name]. She will try the Flexeril and is aware of the sedation cautions. They do want to come in the office tomorrow. Appt has been made for pt at 130 Thursday Dec 29. Appt Note states to let Dr Para March know when pt has arrived. This is in case there is no coverage at 130.

## 2021-10-29 ENCOUNTER — Ambulatory Visit (INDEPENDENT_AMBULATORY_CARE_PROVIDER_SITE_OTHER)
Admission: RE | Admit: 2021-10-29 | Discharge: 2021-10-29 | Disposition: A | Discharge status: Home / Self Care | Payer: Medicare Other | Source: Ambulatory Visit | Attending: Family Medicine | Admitting: Family Medicine

## 2021-10-29 ENCOUNTER — Other Ambulatory Visit: Payer: Self-pay

## 2021-10-29 ENCOUNTER — Telehealth: Payer: Self-pay

## 2021-10-29 ENCOUNTER — Encounter: Payer: Self-pay | Admitting: Family Medicine

## 2021-10-29 ENCOUNTER — Ambulatory Visit (INDEPENDENT_AMBULATORY_CARE_PROVIDER_SITE_OTHER): Payer: Medicare Other | Admitting: Family Medicine

## 2021-10-29 ENCOUNTER — Ambulatory Visit (INDEPENDENT_AMBULATORY_CARE_PROVIDER_SITE_OTHER): Payer: Medicare Other

## 2021-10-29 VITALS — BP 140/68 | HR 47 | Temp 98.3°F | Wt 190.0 lb

## 2021-10-29 DIAGNOSIS — R519 Headache, unspecified: Secondary | ICD-10-CM

## 2021-10-29 DIAGNOSIS — Z7901 Long term (current) use of anticoagulants: Secondary | ICD-10-CM | POA: Diagnosis not present

## 2021-10-29 LAB — POCT INR: INR: 2.2 (ref 2.0–3.0)

## 2021-10-29 MED ORDER — ONDANSETRON HCL 4 MG PO TABS: 4.0000 mg | ORAL_TABLET | Freq: Three times a day (TID) | ORAL | 0 refills | Status: DC | PRN

## 2021-10-29 MED ORDER — GABAPENTIN 100 MG PO CAPS: 100.0000 mg | ORAL_CAPSULE | Freq: Two times a day (BID) | ORAL | 1 refills | Status: DC | PRN

## 2021-10-29 NOTE — Telephone Encounter (Signed)
Received delayed faxes from [employer], pt's home monitoring company for her home INR machine. The faxes report on 12/12 there was a 3.0 INR and on 12/22 there was a 2.8 INR. Neither of these were received at the time of the testing so the pt was not contacted and no dosing was changed.

## 2021-10-29 NOTE — Progress Notes (Signed)
This visit occurred during the SARS-CoV-2 public health emergency.  Safety protocols were in place, including screening questions prior to the visit, additional usage of staff PPE, and extensive cleaning of exam room while observing appropriate contact time as indicated for disinfecting solutions.  Headache.  Light but not sound sensitive.  INR at goal at 2.2.  BP and pulse at baseline.    HA has been going on for weeks but at variable level.  Noted after eval with Dr. Ermalene Searing, after 10/13/21.  HA increasing in pain in the meantime.  Can be in variable locations, on the scalp.  It can be on either side of the midline.  Some neck pain and 2 episodes of L arm numbness.  No R arm sx.  Grip is at baseline recently.    She had fallen 09/27/21.  No LOC but she did hit her head.  She had local swelling on the R scalp that resolved.    Prev rib pain got better.    She has a h/o headaches in the past, usually with higher salt meals in the past.  No vision loss but she'll occ see an object and also the shadow of an object.  That may predate the HA.    Tylenol helps minimally but then wears off. No sweats, no known fevers.  No facial pain.  Pain feels like it is limited to the scalp.  Flexeril didn't help much.    Isn't having neck pain now.  Some nausea today.    Meds, vitals, and allergies reviewed.   ROS: Per HPI unless specifically indicated in ROS section   Nad Ncat Neck supple, no LA PERRL Rrr Ctab Abd soft, not ttp.  CN 2-12 wnl B, S/S wnl x4 Skin well perfused.  35 minutes were devoted to patient care in this encounter (this includes time spent reviewing the patient's file/history, interviewing and examining the patient, counseling/reviewing plan with patient).

## 2021-10-29 NOTE — Patient Instructions (Addendum)
Try gabapentin for pain and zofran for nausea.  We'll work on getting the scan set up.  If worsening or new symptoms, then go to the ER/dial 911.  Take care.  Glad to see you.

## 2021-10-29 NOTE — Patient Instructions (Addendum)
Pre visit review using our clinic review tool, if applicable. No additional management support is needed unless otherwise documented below in the visit note.  Continue 4 mg daily except take 2mg on Mon, Wed, and Fri and recheck in 1 week.  

## 2021-10-29 NOTE — Progress Notes (Signed)
Continue 4 mg daily except take 2mg on Mon, Wed, and Fri and recheck in 1 week.  

## 2021-10-30 ENCOUNTER — Telehealth: Payer: Self-pay

## 2021-10-30 MED ORDER — DICLOFENAC SODIUM 1 % EX GEL
2.0000 g | Freq: Three times a day (TID) | CUTANEOUS | 1 refills | Status: DC | PRN
Start: 2021-10-30 — End: 2021-11-09

## 2021-10-30 NOTE — Telephone Encounter (Signed)
Would stop gabapentin.  Would try diclofenac gel at the back of the neck/base of the skull and see if that helps.  She shouldn't have enough systemic absorption with the med to have ADE on med.  Rx sent but can get that OTC.  Thanks.

## 2021-10-30 NOTE — Telephone Encounter (Signed)
Spoke with Mallory Estrada and advised to have patient stop taking gabapentin and try diclofenac gel. Mallory Estrada verbalized understanding and will pick that up as soon as she can.

## 2021-10-30 NOTE — Addendum Note (Signed)
Addended by: Joaquim Nam on: 10/30/2021 01:40 PM   Modules accepted: Orders

## 2021-10-30 NOTE — Telephone Encounter (Signed)
Patient was started on Gabapentin 100mg  one tablet. She took at 5pm yesterday and 6am today. Having dizziness both times. Did have fall last night in bathroom but denies any injury. Did have some improvement with pain but did not go away completely. She only feels little improvement in symptoms.  Wanted to give you an update and get advise on how to move forward. Daughter has checked all over head and did not see any evidence of injury to head after fall.

## 2021-11-02 DIAGNOSIS — R519 Headache, unspecified: Secondary | ICD-10-CM

## 2021-11-02 HISTORY — DX: Headache, unspecified: R51.9

## 2021-11-02 NOTE — Assessment & Plan Note (Signed)
Reasonable to get imaging done given her anticoagulation and recurrent headache.  This could be benign occipital neuralgia with episodic nerve root irritation in the neck causing episodic arm numbness but it is reasonable to get the CT done.  She can use gabapentin as needed for pain.  See notes on imaging.  She can update me as needed.  Here today with her daughter.  Still okay for outpatient follow-up.  Can use Zofran for nausea.  Routine ER cautions given to patient and daughter.

## 2021-11-03 ENCOUNTER — Other Ambulatory Visit: Payer: Self-pay | Admitting: Family Medicine

## 2021-11-03 DIAGNOSIS — Z659 Problem related to unspecified psychosocial circumstances: Secondary | ICD-10-CM

## 2021-11-04 NOTE — Telephone Encounter (Signed)
Janett Billow- please check with [facility] and see if they can provide respite care and if they can provide PT.  Either way, please let me know.  Thanks.

## 2021-11-07 ENCOUNTER — Encounter: Payer: Self-pay | Admitting: *Deleted

## 2021-11-07 LAB — POCT INR: INR: 2.2 (ref 2.0–3.0)

## 2021-11-09 ENCOUNTER — Other Ambulatory Visit: Payer: Self-pay

## 2021-11-09 ENCOUNTER — Encounter: Payer: Self-pay | Admitting: Obstetrics and Gynecology

## 2021-11-09 ENCOUNTER — Ambulatory Visit: Payer: Medicare Other | Admitting: Obstetrics and Gynecology

## 2021-11-09 ENCOUNTER — Ambulatory Visit (INDEPENDENT_AMBULATORY_CARE_PROVIDER_SITE_OTHER): Payer: Medicare Other

## 2021-11-09 ENCOUNTER — Other Ambulatory Visit: Payer: Self-pay | Admitting: Family Medicine

## 2021-11-09 VITALS — BP 137/75 | HR 64

## 2021-11-09 DIAGNOSIS — Z7901 Long term (current) use of anticoagulants: Secondary | ICD-10-CM | POA: Diagnosis not present

## 2021-11-09 DIAGNOSIS — N3281 Overactive bladder: Secondary | ICD-10-CM

## 2021-11-09 NOTE — Telephone Encounter (Signed)
Pt compliant with coumadin management and PCP apts. Sent in refill

## 2021-11-09 NOTE — Progress Notes (Signed)
[facility]  PTNS VISIT  CC:  Overactive bladder  81 y.o. with refractory overactive bladder who presents for percutaneous tibial nerve stimulation. The patient presents for PTNS session # 1.  Vitals:   11/09/21 1106  BP: 137/75  Pulse: 64   Baseline bladder diary completed- scanned into Epic. Pt has a mixed incontinence, urge predominant.   Procedure: The patient spontaneously voided prior to beginning the procedure. The patient was placed in the sitting position and the right lower extremity was prepped in the usual fashion. The PTNS needle was then inserted at a 60 degree angle, 5 cm cephalad and 2 cm posterior to the medial malleolus. The PTNS unit was then programmed and an optimal response was noted at 3 milliamps. The PTNS stimulation was then performed at this setting for 30 minutes without incident and the patient tolerated the procedure well. The needle was removed and hemostasis was noted.   The pt will return in 1 weeks for PTNS session # 2. All questions were answered.

## 2021-11-09 NOTE — Progress Notes (Signed)
Received fax today with result of 2.2 for INR on 11/07/21. Continue 4 mg daily except take 2mg  on Mon, Wed, and Fri and recheck in 1 week.

## 2021-11-09 NOTE — Patient Instructions (Addendum)
Pre visit review using our clinic review tool, if applicable. No additional management support is needed unless otherwise documented below in the visit note.  Continue 4 mg daily except take 2mg on Mon, Wed, and Fri and recheck in 1 week.  

## 2021-11-12 ENCOUNTER — Ambulatory Visit (INDEPENDENT_AMBULATORY_CARE_PROVIDER_SITE_OTHER): Payer: Medicare Other

## 2021-11-12 DIAGNOSIS — Z7901 Long term (current) use of anticoagulants: Secondary | ICD-10-CM

## 2021-11-12 LAB — POCT INR: INR: 2.9 (ref 2.0–3.0)

## 2021-11-12 NOTE — Patient Instructions (Addendum)
Pre visit review using our clinic review tool, if applicable. No additional management support is needed unless otherwise documented below in the visit note.  Continue 4 mg daily except take 2mg on Mon, Wed, and Fri and recheck in 1 week.  

## 2021-11-12 NOTE — Progress Notes (Signed)
Continue 4 mg daily except take 2mg on Mon, Wed, and Fri and recheck in 1 week.  

## 2021-11-16 ENCOUNTER — Ambulatory Visit: Payer: Self-pay

## 2021-11-16 NOTE — Progress Notes (Signed)
A user error has taken place: encounter opened in error, closed for administrative reasons.

## 2021-11-16 NOTE — Progress Notes (Signed)
[facility]  PTNS VISIT  CC:  Overactive bladder  81 y.o. with refractory overactive bladder who presents for percutaneous tibial nerve stimulation. The patient presents for PTNS session # 2.  Vitals:   11/17/21 1113  BP: 122/63  Pulse: (!) 52    Baseline bladder diary completed- scanned into Epic. Pt has a mixed incontinence, urge predominant.   Procedure: The patient spontaneously voided prior to beginning the procedure. The patient was placed in the sitting position and the left lower extremity was prepped in the usual fashion. The PTNS needle was then inserted at a 60 degree angle, 5 cm cephalad and 2 cm posterior to the medial malleolus. The PTNS unit was then programmed and an optimal response was noted at 7 milliamps. The PTNS stimulation was then performed at this setting for 30 minutes without incident and the patient tolerated the procedure well. The needle was removed and hemostasis was noted.   The pt will return in 1 weeks for PTNS session # 3. All questions were answered.

## 2021-11-17 ENCOUNTER — Other Ambulatory Visit: Payer: Self-pay

## 2021-11-17 ENCOUNTER — Encounter: Payer: Self-pay | Admitting: Obstetrics and Gynecology

## 2021-11-17 ENCOUNTER — Ambulatory Visit: Payer: Medicare Other | Admitting: Obstetrics and Gynecology

## 2021-11-17 VITALS — BP 122/63 | HR 52

## 2021-11-17 DIAGNOSIS — N3281 Overactive bladder: Secondary | ICD-10-CM | POA: Diagnosis not present

## 2021-11-19 ENCOUNTER — Ambulatory Visit (INDEPENDENT_AMBULATORY_CARE_PROVIDER_SITE_OTHER): Payer: Medicare Other

## 2021-11-19 DIAGNOSIS — Z7901 Long term (current) use of anticoagulants: Secondary | ICD-10-CM

## 2021-11-19 LAB — POCT INR: INR: 3.2 — AB (ref 2.0–3.0)

## 2021-11-19 NOTE — Patient Instructions (Addendum)
Pre visit review using our clinic review tool, if applicable. No additional management support is needed unless otherwise documented below in the visit note.  Only take 1 tablet today and then continue 4 mg daily except take 2mg  on Mon, Wed, and Fri and recheck in 1 week.

## 2021-11-19 NOTE — Progress Notes (Signed)
Only take 1 tablet today and then continue 4 mg daily except take 2mg  on Mon, Wed, and Fri and recheck in 1 week.

## 2021-11-23 ENCOUNTER — Encounter: Payer: Self-pay | Admitting: Family Medicine

## 2021-11-23 ENCOUNTER — Other Ambulatory Visit: Payer: Self-pay

## 2021-11-23 ENCOUNTER — Ambulatory Visit (INDEPENDENT_AMBULATORY_CARE_PROVIDER_SITE_OTHER): Payer: Medicare Other | Admitting: Family Medicine

## 2021-11-23 VITALS — BP 120/60 | HR 57 | Temp 98.1°F | Ht 65.0 in | Wt 185.0 lb

## 2021-11-23 DIAGNOSIS — R519 Headache, unspecified: Secondary | ICD-10-CM | POA: Diagnosis not present

## 2021-11-23 DIAGNOSIS — Z0000 Encounter for general adult medical examination without abnormal findings: Secondary | ICD-10-CM

## 2021-11-23 DIAGNOSIS — E039 Hypothyroidism, unspecified: Secondary | ICD-10-CM | POA: Diagnosis not present

## 2021-11-23 DIAGNOSIS — E785 Hyperlipidemia, unspecified: Secondary | ICD-10-CM

## 2021-11-23 DIAGNOSIS — R413 Other amnesia: Secondary | ICD-10-CM | POA: Diagnosis not present

## 2021-11-23 DIAGNOSIS — R269 Unspecified abnormalities of gait and mobility: Secondary | ICD-10-CM

## 2021-11-23 DIAGNOSIS — I480 Paroxysmal atrial fibrillation: Secondary | ICD-10-CM

## 2021-11-23 DIAGNOSIS — Z7189 Other specified counseling: Secondary | ICD-10-CM

## 2021-11-23 LAB — CBC WITH DIFFERENTIAL/PLATELET
Basophils Absolute: 0 10*3/uL (ref 0.0–0.1)
Basophils Relative: 0.7 % (ref 0.0–3.0)
Eosinophils Absolute: 0.1 10*3/uL (ref 0.0–0.7)
Eosinophils Relative: 2.1 % (ref 0.0–5.0)
HCT: 39.6 % (ref 36.0–46.0)
Hemoglobin: 13.3 g/dL (ref 12.0–15.0)
Lymphocytes Relative: 22.8 % (ref 12.0–46.0)
Lymphs Abs: 1.4 10*3/uL (ref 0.7–4.0)
MCHC: 33.5 g/dL (ref 30.0–36.0)
MCV: 90.8 fl (ref 78.0–100.0)
Monocytes Absolute: 0.5 10*3/uL (ref 0.1–1.0)
Monocytes Relative: 7.9 % (ref 3.0–12.0)
Neutro Abs: 4 10*3/uL (ref 1.4–7.7)
Neutrophils Relative %: 66.5 % (ref 43.0–77.0)
Platelets: 221 10*3/uL (ref 150.0–400.0)
RBC: 4.36 Mil/uL (ref 3.87–5.11)
RDW: 13.9 % (ref 11.5–15.5)
WBC: 6 10*3/uL (ref 4.0–10.5)

## 2021-11-23 LAB — LIPID PANEL
Cholesterol: 152 mg/dL (ref 0–200)
HDL: 46.8 mg/dL (ref >39.00)
LDL Cholesterol: 75 mg/dL (ref 0–99)
NonHDL: 104.89
Total CHOL/HDL Ratio: 3
Triglycerides: 150 mg/dL — ABNORMAL HIGH (ref 0.0–149.0)
VLDL: 30 mg/dL (ref 0.0–40.0)

## 2021-11-23 LAB — COMPREHENSIVE METABOLIC PANEL WITH GFR
ALT: 10 U/L (ref 0–35)
AST: 19 U/L (ref 0–37)
Albumin: 4.4 g/dL (ref 3.5–5.2)
Alkaline Phosphatase: 67 U/L (ref 39–117)
BUN: 34 mg/dL — ABNORMAL HIGH (ref 6–23)
CO2: 27 meq/L (ref 19–32)
Calcium: 10 mg/dL (ref 8.4–10.5)
Chloride: 105 meq/L (ref 96–112)
Creatinine, Ser: 1.17 mg/dL (ref 0.40–1.20)
GFR: 44.3 mL/min — ABNORMAL LOW (ref >60.00)
Glucose, Bld: 127 mg/dL — ABNORMAL HIGH (ref 70–99)
Potassium: 4.2 meq/L (ref 3.5–5.1)
Sodium: 143 meq/L (ref 135–145)
Total Bilirubin: 0.4 mg/dL (ref 0.2–1.2)
Total Protein: 7.4 g/dL (ref 6.0–8.3)

## 2021-11-23 LAB — TSH: TSH: 1.05 u[IU]/mL (ref 0.35–5.50)

## 2021-11-23 LAB — VITAMIN B12: Vitamin B-12: 1427 pg/mL — ABNORMAL HIGH (ref 211–911)

## 2021-11-23 MED ORDER — FUROSEMIDE 20 MG PO TABS: ORAL_TABLET | ORAL | Status: DC

## 2021-11-23 MED ORDER — FOSFOMYCIN TROMETHAMINE 3 G PO PACK: PACK | ORAL | Status: DC

## 2021-11-23 NOTE — Progress Notes (Signed)
This visit occurred during the SARS-CoV-2 public health emergency.  Safety protocols were in place, including screening questions prior to the visit, additional usage of staff PPE, and extensive cleaning of exam room while observing appropriate contact time as indicated for disinfecting solutions.  I have personally reviewed the Medicare Annual Wellness questionnaire and have noted 1. The patient's medical and social history 2. Their use of alcohol, tobacco or illicit drugs 3. Their current medications and supplements 4. The patient's functional ability including ADL's, fall risks, home safety risks and hearing or visual             impairment. 5. Diet and physical activities 6. Evidence for depression or mood disorders  The patients weight, height, BMI have been recorded in the chart and visual acuity is per eye clinic.  I have made referrals, counseling and provided education to the patient based review of the above and I have provided the pt with a written personalized care plan for preventive services.  Provider list updated- see scanned forms.  Routine anticipatory guidance given to patient.  See health maintenance. The possibility exists that previously documented standard health maintenance information may have been brought forward from a previous encounter into this note.  If needed, that same information has been updated to reflect the current situation based on today's encounter.    Flu 2022 Shingles previously done per patient report. PNA previously done per patient report. Tetanus discussed with patient COVID-vaccine previously done. Colon deferred given her age. Mammogram pending 2023 Breast cancer screening deferred 2023.  Previously done in [place] and normal per patient report. Advance directive- Daughter [name] designated if patient were incapacitated. Cognitive function addressed- see scanned forms- and if abnormal then additional documentation follows.   In addition to  [facility], follow up visit for the below conditions:  Dysuria. On fosfomycin every 10 days w/o UTI in the meantime per urogynecology.  She started PTNS treatment recently.   We talked about dec in lasix if ankles continue not to be puffy.    HA.  Continues but diminished.  Didn't tolerate gabapentin.  Heat helps.  No new sx. Tylenol helps.    Hypothyroidism.  Compliant.  No neck mass.  Labs pending.    Elevated Cholesterol: Using medications without problems:yes Muscle aches: no Labs pending.    AF, on anticoagulation.  No bleeding.  Compliant.  Follows with anticoagulation clinic, checking INR at home.  No chest pain.  Memory changes d/w pt.  Not a new issue.  She has some slower recall.  Oriented x3 and 2 out of 3 on recall.  We agreed to observe.  Her daughter is helping her out at home.  Gait abnormality.  Discussed PT for balance changes.  Used [employer]. Reordered.  She needs help with bathing due to fall risk.  She is going to pay out of pocking for counseling and then try to get it covered.    PMH and SH reviewed  Meds, vitals, and allergies reviewed.   ROS: Per HPI.  Unless specifically indicated otherwise in HPI, the patient denies:  General: fever. Eyes: acute vision changes ENT: sore throat Cardiovascular: chest pain Respiratory: SOB GI: vomiting GU: dysuria Musculoskeletal: acute back pain Derm: acute rash Neuro: acute motor dysfunction Psych: worsening mood Endocrine: polydipsia Heme: bleeding Allergy: hayfever  GEN: nad, alert and oriented HEENT: ncat NECK: supple w/o LA CV: sounds to be RRR PULM: ctab, no inc wob ABD: soft, +bs EXT: trace BLE edema SKIN: no acute  rash Walking slowly with walker.

## 2021-11-23 NOTE — Patient Instructions (Addendum)
You can try a lidoderm patch on the back of your neck.   Go to the lab on the way out.   If you have mychart we'll likely use that to update you.    Take care.  Glad to see you. I'll work on getting [facility] PT set up.

## 2021-11-24 ENCOUNTER — Other Ambulatory Visit: Payer: Self-pay | Admitting: Family Medicine

## 2021-11-24 ENCOUNTER — Ambulatory Visit
Admission: RE | Admit: 2021-11-24 | Discharge: 2021-11-24 | Disposition: A | Discharge status: Home / Self Care | Payer: Medicare Other | Source: Ambulatory Visit | Attending: Family Medicine | Admitting: Family Medicine

## 2021-11-24 ENCOUNTER — Ambulatory Visit: Payer: Medicare Other | Admitting: Obstetrics and Gynecology

## 2021-11-24 DIAGNOSIS — R928 Other abnormal and inconclusive findings on diagnostic imaging of breast: Secondary | ICD-10-CM

## 2021-11-24 DIAGNOSIS — N6002 Solitary cyst of left breast: Secondary | ICD-10-CM | POA: Diagnosis not present

## 2021-11-26 ENCOUNTER — Ambulatory Visit (INDEPENDENT_AMBULATORY_CARE_PROVIDER_SITE_OTHER): Payer: Medicare Other

## 2021-11-26 DIAGNOSIS — Z0000 Encounter for general adult medical examination without abnormal findings: Secondary | ICD-10-CM | POA: Insufficient documentation

## 2021-11-26 DIAGNOSIS — Z7901 Long term (current) use of anticoagulants: Secondary | ICD-10-CM | POA: Diagnosis not present

## 2021-11-26 LAB — POCT INR: INR: 3.4 — AB (ref 2.0–3.0)

## 2021-11-26 NOTE — Assessment & Plan Note (Signed)
Continue metoprolol and Coumadin. 

## 2021-11-26 NOTE — Assessment & Plan Note (Signed)
See notes on labs.  Continue levothyroxine. °

## 2021-11-26 NOTE — Assessment & Plan Note (Signed)
Memory changes d/w pt.  Not a new issue.  She has some slower recall.  Oriented x3 and 2 out of 3 on recall.  We agreed to observe.  Her daughter is helping her out at home.  No red flag events.

## 2021-11-26 NOTE — Assessment & Plan Note (Signed)
Flu 2022 Shingles previously done per patient report. PNA previously done per patient report. Tetanus discussed with patient COVID-vaccine previously done. Colon deferred given her age. Mammogram pending 2023 Breast cancer screening deferred 2023.  Previously done in [place] and normal per patient report. Advance directive- Daughter Mallory Estrada designated if patient were incapacitated. Cognitive function addressed- see scanned forms- and if abnormal then additional documentation follows.

## 2021-11-26 NOTE — Assessment & Plan Note (Signed)
Refer back to PT

## 2021-11-26 NOTE — Patient Instructions (Addendum)
Pre visit review using our clinic review tool, if applicable. No additional management support is needed unless otherwise documented below in the visit note.  Hold today and then change weekly dose to take 2 mg daily except take 4 mg on Sun, Tues, and Thurs and recheck in 1 week.

## 2021-11-26 NOTE — Assessment & Plan Note (Signed)
Some improvement.  Did not tolerate gabapentin.  Would continue using heat.  She will update me as needed.  Continue Tylenol.

## 2021-11-26 NOTE — Assessment & Plan Note (Signed)
Continue atorvastatin.  See notes on labs. 

## 2021-11-26 NOTE — Assessment & Plan Note (Signed)
Daughter [name] designated if patient were incapacitated. 

## 2021-11-26 NOTE — Progress Notes (Signed)
Hold today and then change weekly dose to take 2 mg daily except take 4 mg on Sun, Tues, and Thurs and recheck in 1 week.

## 2021-11-27 ENCOUNTER — Ambulatory Visit: Payer: Medicare Other | Admitting: Obstetrics and Gynecology

## 2021-11-30 ENCOUNTER — Telehealth: Payer: Self-pay | Admitting: Family Medicine

## 2021-11-30 NOTE — Telephone Encounter (Signed)
Patients daughter notified of lab results and verbalized understanding 

## 2021-11-30 NOTE — Telephone Encounter (Signed)
Pt daughter returning your call. °

## 2021-11-30 NOTE — Progress Notes (Signed)
[facility]  PTNS VISIT  CC:  Overactive bladder  81 y.o. with refractory overactive bladder who presents for percutaneous tibial nerve stimulation. The patient presents for PTNS session # 3.  Vitals:   12/01/21 1147  BP: (!) 141/82  Pulse: (!) 55    She reports that she has been having better control and ability to make it to the bathroom since she started treatment.   Procedure: The patient spontaneously voided prior to beginning the procedure. The patient was placed in the sitting position and the left lower extremity was prepped in the usual fashion. The PTNS needle was then inserted at a 60 degree angle, 5 cm cephalad and 2 cm posterior to the medial malleolus. The PTNS unit was then programmed and an optimal response was noted at 8 milliamps. The PTNS stimulation was then performed at this setting for 30 minutes without incident and the patient tolerated the procedure well. The needle was removed and hemostasis was noted.   The pt will return in 1 weeks for PTNS session # 4. All questions were answered.  Marguerita Beards, MD

## 2021-12-01 ENCOUNTER — Ambulatory Visit: Payer: Medicare Other | Admitting: Obstetrics and Gynecology

## 2021-12-01 ENCOUNTER — Encounter: Payer: Self-pay | Admitting: Family Medicine

## 2021-12-01 ENCOUNTER — Other Ambulatory Visit: Payer: Self-pay

## 2021-12-01 ENCOUNTER — Encounter: Payer: Self-pay | Admitting: Obstetrics and Gynecology

## 2021-12-01 VITALS — BP 141/82 | HR 55

## 2021-12-01 DIAGNOSIS — N3281 Overactive bladder: Secondary | ICD-10-CM | POA: Diagnosis not present

## 2021-12-03 ENCOUNTER — Ambulatory Visit (INDEPENDENT_AMBULATORY_CARE_PROVIDER_SITE_OTHER): Payer: Medicare Other

## 2021-12-03 DIAGNOSIS — Z7901 Long term (current) use of anticoagulants: Secondary | ICD-10-CM | POA: Diagnosis not present

## 2021-12-03 LAB — POCT INR: INR: 2.3 (ref 2.0–3.0)

## 2021-12-03 NOTE — Progress Notes (Signed)
Continue 2 mg daily except take 4 mg on Sun, Tues, and Thurs and recheck in 1 week.  ?

## 2021-12-03 NOTE — Patient Instructions (Addendum)
Pre visit review using our clinic review tool, if applicable. No additional management support is needed unless otherwise documented below in the visit note. ? ?Continue 2 mg daily except take 4 mg on Sun, Tues, and Thurs and recheck in 1 week.  ?

## 2021-12-07 ENCOUNTER — Telehealth: Payer: Self-pay | Admitting: Family Medicine

## 2021-12-07 NOTE — Telephone Encounter (Signed)
Patient's daughter Berline Chough calling about her mom. Has questions about the Estring and if it has been ordered this month. My mom has appt 01/05/22 to have it changed.   CB# [phone]

## 2021-12-07 NOTE — Progress Notes (Signed)
[facility]  PTNS VISIT  CC:  Overactive bladder  81 y.o. with refractory overactive bladder who presents for percutaneous tibial nerve stimulation. The patient presents for PTNS session # 4.   Procedure: The patient spontaneously voided prior to beginning the procedure. The patient was placed in the sitting position and the right lower extremity was prepped in the usual fashion. The PTNS needle was then inserted at a 60 degree angle, 5 cm cephalad and 2 cm posterior to the medial malleolus. The PTNS unit was then programmed and an optimal response was noted at 11 milliamps. The PTNS stimulation was then performed at this setting for 30 minutes without incident and the patient tolerated the procedure well. The needle was removed and hemostasis was noted.   The pt will return in 1 weeks for PTNS session #5. All questions were answered.  Marguerita Beards, MD

## 2021-12-07 NOTE — Telephone Encounter (Signed)
completed

## 2021-12-08 ENCOUNTER — Other Ambulatory Visit: Payer: Self-pay

## 2021-12-08 ENCOUNTER — Ambulatory Visit: Payer: Medicare Other | Admitting: Obstetrics and Gynecology

## 2021-12-08 ENCOUNTER — Encounter: Payer: Self-pay | Admitting: Obstetrics and Gynecology

## 2021-12-08 DIAGNOSIS — N3281 Overactive bladder: Secondary | ICD-10-CM | POA: Diagnosis not present

## 2021-12-09 ENCOUNTER — Telehealth: Payer: Self-pay | Admitting: Internal Medicine

## 2021-12-09 DIAGNOSIS — E039 Hypothyroidism, unspecified: Secondary | ICD-10-CM | POA: Diagnosis not present

## 2021-12-09 DIAGNOSIS — I480 Paroxysmal atrial fibrillation: Secondary | ICD-10-CM | POA: Diagnosis not present

## 2021-12-09 DIAGNOSIS — Z7901 Long term (current) use of anticoagulants: Secondary | ICD-10-CM | POA: Diagnosis not present

## 2021-12-09 DIAGNOSIS — E785 Hyperlipidemia, unspecified: Secondary | ICD-10-CM | POA: Diagnosis not present

## 2021-12-09 DIAGNOSIS — G44209 Tension-type headache, unspecified, not intractable: Secondary | ICD-10-CM | POA: Diagnosis not present

## 2021-12-09 NOTE — Telephone Encounter (Addendum)
[facility] called with request for PT orders asked to call back during business hours. Report of ankle swelling with crackles. Given advice since no SOB/minimal SOB with exertion to take extra lasix tonight and then take 1 pill daily. Will forward to PCP to see if different advice if desired and to reach out to ensure patient is doing better.

## 2021-12-10 ENCOUNTER — Ambulatory Visit (INDEPENDENT_AMBULATORY_CARE_PROVIDER_SITE_OTHER): Payer: Medicare Other

## 2021-12-10 DIAGNOSIS — Z7901 Long term (current) use of anticoagulants: Secondary | ICD-10-CM | POA: Diagnosis not present

## 2021-12-10 LAB — POCT INR: INR: 2.2 (ref 2.0–3.0)

## 2021-12-10 NOTE — Telephone Encounter (Signed)
Please give the PT order and check on status of patient after taking an extra lasix.  Thanks.

## 2021-12-10 NOTE — Progress Notes (Signed)
Daughter and pt called in 2.2 for INR for today. Continue 2 mg daily except take 4 mg on Sun, Tues, and Thurs and recheck in 1 week. Pt and pt's daughter does not want AVS mailed. Both verbalized understanding.

## 2021-12-10 NOTE — Telephone Encounter (Addendum)
°  Please note below access nurse note; Dr Para March has already read note from Dr Okey Dupre. Sending back to [facility].   [facility] Night - Client Nonclinical Telephone Record  AccessNurse Client [name] Night - Client Client Site [facility] - Night Provider Raechel Ache - MD Contact Type Call Call Type Home Care Hospice Page Now Who Is Calling Home Health / Hospice Agency Caller Name [facility] Name [facility] Facility Number [phone] Patient Name Mallory Estrada Patient DOB October 26, 1941 Reason for Call Symptomatic Patient Initial Comment Caller is Physical Therapist requesting orders. They have done the eval and pt has increased SOB and crackles. Ankles are swollen, she wheezes, crackles when lying down. Is taking 2 Melatonin instead of 1. Disp. Time Disposition Final User 12/09/2021 4:58:00 PM Send to [facility] Paging Queue Ardelle Anton 12/09/2021 6:12:51 PM Called On-Call Provider Rhetta Mura 12/09/2021 6:13:34 PM Page Completed Yes Fifer, Ruben Im DoctorName Phone DateTime Result/Outcome Message Type Notes Hillard Danker- MD [phone] 12/09/2021 6:12:51 PM Called On Call Provider - Reached Doctor Paged Hillard Danker- MD 12/09/2021 6:13:29 PM Spoke with On Call - General Message Result Spoke to provider and connected her to the patient. Call Closed By: Rhetta Mura Transaction Date/Time: 12/09/2021 4:53:14 PM (ET

## 2021-12-10 NOTE — Telephone Encounter (Signed)
Spoke with patients daughter [name] and she stated patient is doing better. Swelling came down mostly; still has some tightness in leg but not too bad.   Called and left verbal PT orders on [facility] secure VM.

## 2021-12-10 NOTE — Patient Instructions (Addendum)
Pre visit review using our clinic review tool, if applicable. No additional management support is needed unless otherwise documented below in the visit note. ? ?Continue 2 mg daily except take 4 mg on Sun, Tues, and Thurs and recheck in 1 week.  ?

## 2021-12-11 ENCOUNTER — Encounter: Payer: Self-pay | Admitting: Obstetrics and Gynecology

## 2021-12-11 ENCOUNTER — Encounter: Payer: Self-pay | Admitting: Family Medicine

## 2021-12-11 NOTE — Telephone Encounter (Signed)
Noted. Thanks.

## 2021-12-13 NOTE — Telephone Encounter (Signed)
Please check with patient's daughter about recent fall.  See my chart message.  Thanks.

## 2021-12-14 ENCOUNTER — Ambulatory Visit: Payer: Medicare Other | Admitting: Obstetrics and Gynecology

## 2021-12-14 ENCOUNTER — Encounter: Payer: Self-pay | Admitting: Obstetrics and Gynecology

## 2021-12-14 ENCOUNTER — Other Ambulatory Visit: Payer: Self-pay

## 2021-12-14 VITALS — BP 144/74 | HR 64

## 2021-12-14 DIAGNOSIS — N3281 Overactive bladder: Secondary | ICD-10-CM | POA: Diagnosis not present

## 2021-12-14 NOTE — Progress Notes (Signed)
[facility]  PTNS VISIT  CC:  Overactive bladder  81 y.o. with refractory overactive bladder who presents for percutaneous tibial nerve stimulation. The patient presents for PTNS session # 5.  She feels she has noticed a difference in her symptoms- she is able to get to the bathroom easier.   Procedure: The patient spontaneously voided prior to beginning the procedure. The patient was placed in the sitting position and the right lower extremity was prepped in the usual fashion. The PTNS needle was then inserted at a 60 degree angle, 5 cm cephalad and 2 cm posterior to the medial malleolus. The PTNS unit was then programmed and an optimal response was noted at 7 milliamps. The PTNS stimulation was then performed at this setting for 30 minutes without incident and the patient tolerated the procedure well. The needle was removed and hemostasis was noted.   The pt will return in 1 weeks for PTNS session #6. All questions were answered.  Jaquita Folds, MD

## 2021-12-15 ENCOUNTER — Encounter: Payer: Self-pay | Admitting: Family Medicine

## 2021-12-15 DIAGNOSIS — R269 Unspecified abnormalities of gait and mobility: Secondary | ICD-10-CM

## 2021-12-15 DIAGNOSIS — R2689 Other abnormalities of gait and mobility: Secondary | ICD-10-CM

## 2021-12-15 DIAGNOSIS — F330 Major depressive disorder, recurrent, mild: Secondary | ICD-10-CM | POA: Diagnosis not present

## 2021-12-17 LAB — POCT INR: INR: 2.6 (ref 2.0–3.0)

## 2021-12-18 ENCOUNTER — Ambulatory Visit (INDEPENDENT_AMBULATORY_CARE_PROVIDER_SITE_OTHER): Payer: Medicare Other

## 2021-12-18 ENCOUNTER — Telehealth: Payer: Self-pay | Admitting: Family Medicine

## 2021-12-18 DIAGNOSIS — Z7901 Long term (current) use of anticoagulants: Secondary | ICD-10-CM | POA: Diagnosis not present

## 2021-12-18 MED ORDER — POLYETHYLENE GLYCOL 3350 17 GM/SCOOP PO POWD: 17.0000 g | Freq: Every day | ORAL | Status: DC | PRN

## 2021-12-18 NOTE — Patient Instructions (Addendum)
Pre visit review using our clinic review tool, if applicable. No additional management support is needed unless otherwise documented below in the visit note. ? ?Continue 2 mg daily except take 4 mg on Sun, Tues, and Thurs and recheck in 1 week.  ?

## 2021-12-18 NOTE — Progress Notes (Signed)
Continue 2 mg daily except take 4 mg on Sun, Tues, and Thurs and recheck in 1 week. Pt and pt's daughter does not want AVS mailed. Both verbalized understanding.

## 2021-12-18 NOTE — Telephone Encounter (Signed)
Would try over-the-counter MiraLAX daily as needed if increasing her fiber intake does not take care of it.  Thanks.

## 2021-12-18 NOTE — Telephone Encounter (Signed)
[facility] called wanted to know what they can give her for the constipation  Please call [phone]

## 2021-12-18 NOTE — Telephone Encounter (Signed)
Spoke with Crystal from [employer] and relayed information from Dr. Damita Dunnings.

## 2021-12-18 NOTE — Addendum Note (Signed)
Addended by: Tonia Ghent on: 12/18/2021 03:05 PM   Modules accepted: Orders

## 2021-12-21 NOTE — Progress Notes (Signed)
[facility]  PTNS VISIT  CC:  Overactive bladder  81 y.o. with refractory overactive bladder who presents for percutaneous tibial nerve stimulation. The patient presents for PTNS session # 6.  She feels she has noticed a difference in her symptoms- she is able to get to the bathroom easier.   Procedure: The patient spontaneously voided prior to beginning the procedure. The patient was placed in the sitting position and the right lower extremity was prepped in the usual fashion. The PTNS needle was then inserted at a 60 degree angle, 5 cm cephalad and 2 cm posterior to the medial malleolus. The PTNS unit was then programmed and an optimal response was noted at 9 milliamps. The PTNS stimulation was then performed at this setting for 30 minutes without incident and the patient tolerated the procedure well. The needle was removed and hemostasis was noted.   The pt will return in 1 weeks for PTNS session #7. She will fill out a bladder diary over 2- 3 days.  All questions were answered.  Marguerita Beards, MD

## 2021-12-22 ENCOUNTER — Other Ambulatory Visit: Payer: Self-pay

## 2021-12-22 ENCOUNTER — Ambulatory Visit: Payer: Medicare Other | Admitting: Obstetrics and Gynecology

## 2021-12-22 ENCOUNTER — Encounter: Payer: Self-pay | Admitting: Obstetrics and Gynecology

## 2021-12-22 VITALS — BP 119/62 | HR 46

## 2021-12-22 DIAGNOSIS — N3281 Overactive bladder: Secondary | ICD-10-CM

## 2021-12-24 ENCOUNTER — Ambulatory Visit (INDEPENDENT_AMBULATORY_CARE_PROVIDER_SITE_OTHER): Payer: Medicare Other

## 2021-12-24 DIAGNOSIS — Z7901 Long term (current) use of anticoagulants: Secondary | ICD-10-CM

## 2021-12-24 LAB — POCT INR: INR: 2.2 (ref 2.0–3.0)

## 2021-12-24 NOTE — Progress Notes (Signed)
Continue 2 mg daily except take 4 mg on Sun, Tues, and Thurs and recheck in 1 week.

## 2021-12-24 NOTE — Patient Instructions (Addendum)
Pre visit review using our clinic review tool, if applicable. No additional management support is needed unless otherwise documented below in the visit note.  Continue 2 mg daily except take 4 mg on Sun, Tues, and Thurs and recheck in 1 week.

## 2021-12-29 ENCOUNTER — Encounter: Payer: Self-pay | Admitting: Obstetrics and Gynecology

## 2021-12-29 ENCOUNTER — Ambulatory Visit: Payer: Medicare Other | Admitting: Obstetrics and Gynecology

## 2021-12-29 ENCOUNTER — Other Ambulatory Visit: Payer: Self-pay

## 2021-12-29 VITALS — BP 121/75 | HR 53

## 2021-12-29 DIAGNOSIS — N3281 Overactive bladder: Secondary | ICD-10-CM | POA: Diagnosis not present

## 2021-12-29 NOTE — Progress Notes (Signed)
[facility]  PTNS VISIT  CC:  Overactive bladder  81 y.o. with refractory overactive bladder who presents for percutaneous tibial nerve stimulation. The patient presents for PTNS session # 7.  Bladder diary reviewed. Only using one pad/ diaper per day. Still has occasional leakage with sitting, but not having leakage on the way to the bathroom, has more control. Diary scanned into media.   Procedure: The patient spontaneously voided prior to beginning the procedure. The patient was placed in the sitting position and the right lower extremity was prepped in the usual fashion. The PTNS needle was then inserted at a 60 degree angle, 5 cm cephalad and 2 cm posterior to the medial malleolus. The PTNS unit was then programmed and an optimal response was noted at 5 milliamps. The PTNS stimulation was then performed at this setting for 30 minutes without incident and the patient tolerated the procedure well. The needle was removed and hemostasis was noted.   The pt will return in 1 week for PTNS session #8. All questions were answered.  Marguerita Beards, MD

## 2021-12-31 ENCOUNTER — Encounter: Payer: Self-pay | Admitting: Physician Assistant

## 2021-12-31 LAB — POCT INR: INR: 2.3 (ref 2.0–3.0)

## 2022-01-01 ENCOUNTER — Ambulatory Visit (INDEPENDENT_AMBULATORY_CARE_PROVIDER_SITE_OTHER): Payer: Medicare Other

## 2022-01-01 DIAGNOSIS — Z7901 Long term (current) use of anticoagulants: Secondary | ICD-10-CM

## 2022-01-01 NOTE — Progress Notes (Signed)
Continue 2 mg daily except take 4 mg on Sun, Tues, and Thurs and recheck in 1 week.  ?

## 2022-01-01 NOTE — Patient Instructions (Addendum)
Pre visit review using our clinic review tool, if applicable. No additional management support is needed unless otherwise documented below in the visit note. ? ?Continue 2 mg daily except take 4 mg on Sun, Tues, and Thurs and recheck in 1 week.  ?

## 2022-01-04 ENCOUNTER — Telehealth: Payer: Self-pay | Admitting: Family Medicine

## 2022-01-04 MED ORDER — FUROSEMIDE 20 MG PO TABS: ORAL_TABLET | ORAL | 3 refills | Status: DC

## 2022-01-04 NOTE — Telephone Encounter (Signed)
I resent the rx.  Thanks.  

## 2022-01-04 NOTE — Telephone Encounter (Signed)
Pt daughter called stating that medication furosemide (LASIX) 20 MG tablet was sent to [facility] - [place], [place] - [address] without the whole dosage intake. Pt daughter states that [facility] only has pt taking 2 tab on MWF but does not have the I tab a day on it. Pt daughter is asking if you could seen another prescription with the correct dosage.Pt daughter also states that pt only have 6 days left of medication ?

## 2022-01-04 NOTE — Addendum Note (Signed)
Addended by: Joaquim Nam on: 01/04/2022 01:59 PM ? ? Modules accepted: Orders ? ?

## 2022-01-05 ENCOUNTER — Encounter: Payer: Self-pay | Admitting: Obstetrics and Gynecology

## 2022-01-05 ENCOUNTER — Encounter: Payer: Self-pay | Admitting: Family Medicine

## 2022-01-05 ENCOUNTER — Ambulatory Visit: Payer: Medicare Other | Admitting: Obstetrics and Gynecology

## 2022-01-05 ENCOUNTER — Other Ambulatory Visit: Payer: Self-pay

## 2022-01-05 VITALS — BP 114/60 | HR 43

## 2022-01-05 DIAGNOSIS — N3281 Overactive bladder: Secondary | ICD-10-CM | POA: Diagnosis not present

## 2022-01-05 NOTE — Progress Notes (Signed)
[facility] ? ?PTNS VISIT ? ?CC:  Overactive bladder ? ?81 y.o. with refractory overactive bladder who presents for percutaneous tibial nerve stimulation. ?The patient presents for PTNS session # 8. ? ?Bladder diary reviewed. Only using one pad/ diaper per day. Still has occasional leakage with sitting, but not having leakage on the way to the bathroom, has more control. Diary scanned into media.  ? ?Procedure: ?The patient spontaneously voided prior to beginning the procedure. The patient was placed in the sitting position and the right lower extremity was prepped in the usual fashion. The PTNS needle was then inserted at a 60 degree angle, 5 cm cephalad and 2 cm posterior to the medial malleolus. The PTNS unit was then programmed and an optimal response was noted at 3 milliamps. The PTNS stimulation was then performed at this setting for 30 minutes without incident and the patient tolerated the procedure well. The needle was removed and hemostasis was noted.  ? ?The pt will return in 1 week for PTNS session #9. ?All questions were answered. ? ?Jaquita Folds, MD ? ?

## 2022-01-06 DIAGNOSIS — M13842 Other specified arthritis, left hand: Secondary | ICD-10-CM | POA: Diagnosis not present

## 2022-01-06 DIAGNOSIS — M25512 Pain in left shoulder: Secondary | ICD-10-CM | POA: Diagnosis not present

## 2022-01-06 DIAGNOSIS — M13841 Other specified arthritis, right hand: Secondary | ICD-10-CM | POA: Diagnosis not present

## 2022-01-06 DIAGNOSIS — M25511 Pain in right shoulder: Secondary | ICD-10-CM | POA: Diagnosis not present

## 2022-01-07 ENCOUNTER — Ambulatory Visit (INDEPENDENT_AMBULATORY_CARE_PROVIDER_SITE_OTHER): Payer: Medicare Other

## 2022-01-07 DIAGNOSIS — Z7901 Long term (current) use of anticoagulants: Secondary | ICD-10-CM | POA: Diagnosis not present

## 2022-01-07 LAB — POCT INR: INR: 2.2 (ref 2.0–3.0)

## 2022-01-07 NOTE — Patient Instructions (Addendum)
Pre visit review using our clinic review tool, if applicable. No additional management support is needed unless otherwise documented below in the visit note. ? ?Continue 2 mg daily except take 4 mg on Sun, Tues, and Thurs and recheck in 1 week. Pt and pt's daughter does not want AVS mailed. Both verbalized understanding.  ?

## 2022-01-07 NOTE — Progress Notes (Signed)
INR result of 2.2 for today was retrieved from md INR [employer] website.  ?Continue 2 mg daily except take 4 mg on Sun, Tues, and Thurs and recheck in 1 week. Pt and pt's daughter does not want AVS mailed. Both verbalized understanding.  ?Also discusses request to take turmeric supplement. With consult from PCP, there is a moderate interaction with turmeric and warfarin and advised the pt not to start taking turmeric due to increased bleed risk.  ?

## 2022-01-07 NOTE — Telephone Encounter (Signed)
I need your input about any potential turmeric use in this patient.  Thanks. ?

## 2022-01-07 NOTE — Telephone Encounter (Signed)
Turmeric has a moderate interaction, with an increased risk of bleeding, with warfarin so if she is going to start it I would check INR  2-3 days after starting and continue close monitoring until INR is stable.  ?

## 2022-01-07 NOTE — Telephone Encounter (Signed)
Discussed with PCP and advised there is an increased risk of bleeding with turmeric and warfarin interaction. This is not advised with warfarin. PCP agreed. ?Advised pt's daughter, [name], when she was contacted for pt's INR result today. [name] reports the pt will not take it then. She is wondering if GABA would help the pt with sleep. Advised this nurse will research it and advise if there is any interaction and f/u with her next week. [name] was appreciative and will let her mother know not to take turmeric.  ? ?

## 2022-01-08 DIAGNOSIS — I480 Paroxysmal atrial fibrillation: Secondary | ICD-10-CM | POA: Diagnosis not present

## 2022-01-08 DIAGNOSIS — E039 Hypothyroidism, unspecified: Secondary | ICD-10-CM | POA: Diagnosis not present

## 2022-01-08 DIAGNOSIS — E785 Hyperlipidemia, unspecified: Secondary | ICD-10-CM | POA: Diagnosis not present

## 2022-01-08 DIAGNOSIS — Z7901 Long term (current) use of anticoagulants: Secondary | ICD-10-CM | POA: Diagnosis not present

## 2022-01-08 DIAGNOSIS — G44209 Tension-type headache, unspecified, not intractable: Secondary | ICD-10-CM | POA: Diagnosis not present

## 2022-01-11 ENCOUNTER — Other Ambulatory Visit: Payer: Self-pay

## 2022-01-11 ENCOUNTER — Encounter: Payer: Self-pay | Admitting: Obstetrics and Gynecology

## 2022-01-11 ENCOUNTER — Ambulatory Visit: Payer: Medicare Other | Admitting: Obstetrics and Gynecology

## 2022-01-11 VITALS — BP 114/71 | HR 52

## 2022-01-11 DIAGNOSIS — N3281 Overactive bladder: Secondary | ICD-10-CM

## 2022-01-11 NOTE — Progress Notes (Signed)
[facility] ? ?PTNS VISIT ? ?CC:  Overactive bladder ? ?81 y.o. with refractory overactive bladder who presents for percutaneous tibial nerve stimulation. ?The patient presents for PTNS session # 9. ? ?Procedure: ?The patient spontaneously voided prior to beginning the procedure. The patient was placed in the sitting position and the right lower extremity was prepped in the usual fashion. The PTNS needle was then inserted at a 60 degree angle, 5 cm cephalad and 2 cm posterior to the medial malleolus. The PTNS unit was then programmed and an optimal response was noted at 5 milliamps. The PTNS stimulation was then performed at this setting for 30 minutes without incident and the patient tolerated the procedure well. The needle was removed and hemostasis was noted.  ? ?The pt will return in 1 week for PTNS session #10. ?All questions were answered. ? ?Marguerita Beards, MD ? ?

## 2022-01-12 ENCOUNTER — Telehealth: Payer: Self-pay

## 2022-01-12 DIAGNOSIS — F330 Major depressive disorder, recurrent, mild: Secondary | ICD-10-CM | POA: Diagnosis not present

## 2022-01-12 NOTE — Telephone Encounter (Signed)
Pt's daughter, [name], inquired if it would be ok for pt to take GABA (gamma-aminobutyric acid) to help with the pt's sleep. Advised this nurse would research and get back with her. Found article in [employer], at website [email or web address] ?Which advises GABA should not be used if taking anticoagulants, such as warfarin.  ?Printed article for pt's daughter and mailed.  ?

## 2022-01-13 ENCOUNTER — Encounter: Payer: Self-pay | Admitting: Physician Assistant

## 2022-01-13 ENCOUNTER — Other Ambulatory Visit: Payer: Self-pay

## 2022-01-13 ENCOUNTER — Ambulatory Visit: Payer: Medicare Other | Admitting: Physician Assistant

## 2022-01-13 VITALS — BP 136/66 | HR 78 | Resp 18 | Ht 69.0 in

## 2022-01-13 DIAGNOSIS — Z9889 Other specified postprocedural states: Secondary | ICD-10-CM | POA: Diagnosis not present

## 2022-01-13 DIAGNOSIS — R269 Unspecified abnormalities of gait and mobility: Secondary | ICD-10-CM | POA: Diagnosis not present

## 2022-01-13 DIAGNOSIS — R413 Other amnesia: Secondary | ICD-10-CM | POA: Diagnosis not present

## 2022-01-13 DIAGNOSIS — G309 Alzheimer's disease, unspecified: Secondary | ICD-10-CM

## 2022-01-13 DIAGNOSIS — F0280 Dementia in other diseases classified elsewhere without behavioral disturbance: Secondary | ICD-10-CM

## 2022-01-13 MED ORDER — MEMANTINE HCL 5 MG PO TABS: ORAL_TABLET | ORAL | 11 refills | Status: DC

## 2022-01-13 NOTE — Patient Instructions (Addendum)
It was a pleasure to see you today at our office.  ? ?Recommendations: ? ?Neurocognitive evaluation at our office ?MRI of the brain, the radiology office will call you to arrange you appointment ?Check labs today ?Follow up with Cardiology for A fibrillation ?Referral to Neurosurgery for continuation of care  ?Start Memantine 5mg  tablets.  Take 1 tablet at bedtime for 2 weeks, then 1 tablet twice daily.   Side effects include dizziness, headache, diarrhea or constipation.  Call if severe symptoms appear  ?Follow up  1 month ? ?RECOMMENDATIONS FOR ALL PATIENTS WITH MEMORY PROBLEMS: ?1. Continue to exercise (Recommend 30 minutes of walking everyday, or 3 hours every week) ?2. Increase social interactions - continue going to [place] and enjoy social gatherings with friends and family ?3. Eat healthy, avoid fried foods and eat more fruits and vegetables ?4. Maintain adequate blood pressure, blood sugar, and blood cholesterol level. Reducing the risk of stroke and cardiovascular disease also helps promoting better memory. ?5. Avoid stressful situations. Live a simple life and avoid aggravations. Organize your time and prepare for the next day in anticipation. ?6. Sleep well, avoid any interruptions of sleep and avoid any distractions in the bedroom that may interfere with adequate sleep quality ?7. Avoid sugar, avoid sweets as there is a strong link between excessive sugar intake, diabetes, and cognitive impairment ?We discussed the Mediterranean diet, which has been shown to help patients reduce the risk of progressive memory disorders and reduces cardiovascular risk. This includes eating fish, eat fruits and green leafy vegetables, nuts like almonds and hazelnuts, walnuts, and also use olive oil. Avoid fast foods and fried foods as much as possible. Avoid sweets and sugar as sugar use has been linked to worsening of memory function. ? ?There is always a concern of gradual progression of memory problems. If this is the  case, then we may need to adjust level of care according to patient needs. Support, both to the patient and caregiver, should then be put into place.  ? ? ? ? ?You have been referred for a neuropsychological evaluation (i.e., evaluation of memory and thinking abilities). Please bring someone with you to this appointment if possible, as it is helpful for the doctor to hear from both you and another adult who knows you well. Please bring eyeglasses and hearing aids if you wear them.  ?  ?The evaluation will take approximately 3 hours and has two parts: ?  ?The first part is a clinical interview with the neuropsychologist (Dr. Caspe or Dr. Milbert Coulter). During the interview, the neuropsychologist will speak with you and the individual you brought to the appointment.  ?  ?The second part of the evaluation is testing with the doctor's technician Roseanne Reno or Annabelle Harman). During the testing, the technician will ask you to remember different types of material, solve problems, and answer some questionnaires. Your family member will not be present for this portion of the evaluation. ?  ?Please note: We must reserve several hours of the neuropsychologist's time and the psychometrician's time for your evaluation appointment. As such, there is a No-Show fee of $100. If you are unable to attend any of your appointments, please contact our office as soon as possible to reschedule.  ? ? ?FALL PRECAUTIONS: Be cautious when walking. Scan the area for obstacles that may increase the risk of trips and falls. When getting up in the mornings, sit up at the edge of the bed for a few minutes before getting out of bed. Consider elevating  the bed at the head end to avoid drop of blood pressure when getting up. Walk always in a well-lit room (use night lights in the walls). Avoid area rugs or power cords from appliances in the middle of the walkways. Use a walker or a cane if necessary and consider physical therapy for balance exercise. Get your eyesight  checked regularly. ? ?FINANCIAL OVERSIGHT: Supervision, especially oversight when making financial decisions or transactions is also recommended. ? ?HOME SAFETY: Consider the safety of the kitchen when operating appliances like stoves, microwave oven, and blender. Consider having supervision and share cooking responsibilities until no longer able to participate in those. Accidents with firearms and other hazards in the house should be identified and addressed as well. ? ? ?ABILITY TO BE LEFT ALONE: If patient is unable to contact 911 operator, consider using LifeLine, or when the need is there, arrange for someone to stay with patients. Smoking is a fire hazard, consider supervision or cessation. Risk of wandering should be assessed by caregiver and if detected at any point, supervision and safe proof recommendations should be instituted. ? ?MEDICATION SUPERVISION: Inability to self-administer medication needs to be constantly addressed. Implement a mechanism to ensure safe administration of the medications. ? ? ?DRIVING: Regarding driving, in patients with progressive memory problems, driving will be impaired. We advise to have someone else do the driving if trouble finding directions or if minor accidents are reported. Independent driving assessment is available to determine safety of driving. ? ? ?If you are interested in the driving assessment, you can contact the following: ? ?[employer] in [place] [phone] ? ?[facility] [phone] ? ?[facility] [phone] ? ?[facility] [phone] or [phone] ? ? ? ?Mediterranean Diet ?A Mediterranean diet refers to food and lifestyle choices that are based on the traditions of countries located on the [place]. This way of eating has been shown to help prevent certain conditions and improve outcomes for people who have chronic diseases, like kidney disease and heart disease. ?What are tips for  following this plan? ?Lifestyle  ?Cook and eat meals together with your family, when possible. ?Drink enough fluid to keep your urine clear or pale yellow. ?Be physically active every day. This includes: ?Aerobic exercise like running or swimming. ?Leisure activities like gardening, walking, or housework. ?Get 7-8 hours of sleep each night. ?If recommended by your health care provider, drink red wine in moderation. This means 1 glass a day for nonpregnant women and 2 glasses a day for men. A glass of wine equals 5 oz (150 mL). ?Reading food labels  ?Check the serving size of packaged foods. For foods such as rice and pasta, the serving size refers to the amount of cooked product, not dry. ?Check the total fat in packaged foods. Avoid foods that have saturated fat or trans fats. ?Check the ingredients list for added sugars, such as corn syrup. ?Shopping  ?At the grocery store, buy most of your food from the areas near the walls of the store. This includes: ?Fresh fruits and vegetables (produce). ?Grains, beans, nuts, and seeds. Some of these may be available in unpackaged forms or large amounts (in bulk). ?Fresh seafood. ?Poultry and eggs. ?Low-fat dairy products. ?Buy whole ingredients instead of prepackaged foods. ?Buy fresh fruits and vegetables in-season from local farmers markets. ?Buy frozen fruits and vegetables in resealable bags. ?If you do not have access to quality fresh seafood, buy precooked frozen shrimp or canned fish, such as tuna, salmon, or  sardines. ?Buy small amounts of raw or cooked vegetables, salads, or olives from the deli or salad bar at your store. ?Stock your pantry so you always have certain foods on hand, such as olive oil, canned tuna, canned tomatoes, rice, pasta, and beans. ?Cooking  ?Cook foods with extra-virgin olive oil instead of using butter or other vegetable oils. ?Have meat as a side dish, and have vegetables or grains as your main dish. This means having meat in small portions  or adding small amounts of meat to foods like pasta or stew. ?Use beans or vegetables instead of meat in common dishes like chili or lasagna. ?Experiment with different cooking methods. Try roasting or broilin

## 2022-01-13 NOTE — Progress Notes (Signed)
? ? ? ?Assessment/Plan:  ? ?Mallory Estrada is a very pleasant 81 y.o. year old RH female  with a history of Arthritis, Atrial fibrillation, Depression, Hyperlipidemia, Hypothyroidism, Ophthalmoplegic migraine, Chronic back pain s/p multiple surgical procedures seen today for memory loss.  MoCA today is 15/30, with main deficiencies in visuospatial executive, naming, memory and delayed recall 2/5.  Language 0 (she could only produce 2 words in 1 minute) orientation 4/6.  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  Patient has been evaluated while in [place], but she cannot recall the diagnosis, records are needed for information and completion, daughter is to contact the neurology office in [place] to fax as these records. ? ? Recommendations:  ? 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 ?MRI brain with/without contrast to assess for underlying structural abnormality and assess vascular load  ?Neurocognitive testing to further evaluate cognitive concerns and determine other underlying cause of memory changes, including potential contribution from sleep, anxiety, or depression  ?Start memantine 5 mg twice daily, patient and daughter preferred to start with lower dose.  (History of bradycardia, unable to receive ACHI) ?Referral to neurosurgery, to establish continuation of care as the patient has recently moved from [place]. ?Discussed safety both in and out of the home.  ?Discussed the importance of regular daily schedule with inclusion of crossword puzzles to maintain brain function.  ?Continue to monitor mood with PCP.  ?Stay active at least 30 minutes at least 3 times a week.  ?Naps should be scheduled and should be no longer than 60 minutes and should not occur after 2 PM.  ?Control cardiovascular risk factors  ?Mediterranean diet is recommended  ?Folllow up in 4 to 6 weeks to discuss the MRI results  ? ?Subjective:  ? ? ?The patient is seen  in neurologic consultation at the request of Mallory Dunnings Elveria Rising, MD for the evaluation of memory.  The patient is accompanied by her daughter who supplements the history.  The patient has recently moved from [place] to live with her daughter.  ?This is a 44 y.o. year old RH  female who has had memory issues for about 2 or 3 years, after her daughter noticed that she could not hold the letters correctly, and her speech was slower.  She also was more repetitive in her questions, such as "are you going to be here in the morning "several times.  Occasionally she repeats herself.  She denies being disoriented when walking into her room.  She denies living objects in unusual places but sometimes she is looking for the remote control and is in her hands.  The patient had been seen while in [place] by neurology regarding these issues, but she cannot recall the details, or the formal diagnosis.  Records have been requested.  She ambulates with some difficulty.  She had significant history of degenerative disease in her back, having undergone several surgeries, including an L4-L5 compression fracture fusion, as well as a T12 L1-L2 fusion for DJD, which causes her ambulation issues, as well as gait instability.  The patient has been seen by neurosurgery while in [place], and needs to reestablish care here.  She denies vertigo, dizziness, syncope or presyncope.  She does have issues with back pain.  She denies numbness or tingling, or unilateral weakness or tremors.  Her last fall was in 2020, when she fell off the porch, never going to the hospital.  Her mood is stable, denies depression  or irritability.  She is on psychotherapy to deal with the stress of having moved from another place.  According to her daughter, she may have some sundowning.  She has very vivid dreams, but this has been present "throughout her life, she acts out in her dreams, imagining things, such as other people in her bed usually in strangers ", but denies  hallucinations "these are vivid dreams ".  Her daughter states that she laughs at herself about these issues.  "She brings dream world into reality ".  She is unsure of sleepwalking, although sometimes "the walker is way back in the bed and she went to the bathroom without noticing ".  Denies paranoia.  There are no hygiene concerns, she may need some assistance with bathing and dressing.  Her medications are in a pillbox, her daughter supervises.  Daughter is in charge of the finances, and cocaine.  Her appetite is good, denies trouble swallowing.  She denies any headaches, but reports that at times, her scalp hurts to the touch "denies double vision, anosmia, or history of seizures.  She denies urine incontinence, retention, constipation or diarrhea.  In the past, she may have been diagnosed with OSA, but never used CPAP.  She denies alcohol or tobacco history.  No family history of dementia. ? ?TSH 1.05, B12 1400 ? ?Allergies  ?Allergen Reactions  ? Fentanyl   ?  Nausea/vomiting.  ?Can tolerate dilaudid  ? Gabapentin   ?  vertigo  ? Ketamine   ?  intolerant  ? Macrobid [Nitrofurantoin]   ? Sulfa Antibiotics   ?  swelling  ? ? ?Current Outpatient Medications  ?Medication Instructions  ? acetaminophen (TYLENOL) 500 mg, Oral, Every 6 hours PRN  ? atorvastatin (LIPITOR) 10 mg, Oral, Daily  ? Cholecalciferol (VITAMIN D3) 125 MCG (5000 UT) CAPS Oral  ? Estring 2 mg, Vaginal, Every 3 months, follow package directions  ? fosfomycin (MONUROL) 3 g PACK 1 dose every 10 days.  ? furosemide (LASIX) 20 MG tablet 1 tab a day.  If ankle swelling, take 2 tabs on MWF if needed.  ? levothyroxine (SYNTHROID) 50 MCG tablet NEW PRESCRIPTION REQUEST: TAKE ONE TABLET BY MOUTH 30 MINUTES BEFORE BREAKFAST  ? magnesium gluconate (MAGONATE) 500 mg, Oral, Daily at bedtime  ? Melatonin 1 MG CHEW Oral  ? memantine (NAMENDA) 5 MG tablet Take 1 tablet (5 mg at night) for 2 weeks, then increase to 1 tablet 5 mg) twice a day  ? metoprolol  succinate (TOPROL-XL) 25 mg, Oral, Daily  ? polyethylene glycol powder (GLYCOLAX/MIRALAX) 17 g, Oral, Daily PRN  ? potassium chloride SA (KLOR-CON) 20 MEQ tablet NEW PRESCRIPTION REQUEST: TAKE ONE TABLET BY MOUTH EVERY DAY  ? warfarin (COUMADIN) 2 MG tablet TAKE 2 TABLETS DAILY BY MOUTH, EXCEPT TAKE 1 TABLET ON MONDAYS, WEDNESDAYS AND FRIDAYS OR AS DIRECTED BY COUMADIN CLINIC  ? ? ? ?VITALS:   ?Vitals:  ? 01/13/22 1340  ?BP: 136/66  ?Pulse: 78  ?Resp: 18  ?SpO2: 94%  ?Height: 5\' 9"  (1.753 m)  ? ? ? ?PHYSICAL EXAM  ? ?HEENT:  Normocephalic, atraumatic. The mucous membranes are moist. The superficial temporal arteries are without ropiness or tenderness. ?Cardiovascular: Regular rate and rhythm. ?Lungs: Clear to auscultation bilaterally. ?Neck: There are no carotid bruits noted bilaterally. ? ?NEUROLOGICAL: ?Montreal Cognitive Assessment  01/14/2022  ?Visuospatial/ Executive (0/5) 1  ?Naming (0/3) 2  ?Attention: Read list of digits (0/2) 1  ?Attention: Read list of letters (0/1) 1  ?Attention: Serial  7 subtraction starting at 100 (0/3) 1  ?Language: Repeat phrase (0/2) 2  ?Language : Fluency (0/1) 0  ?Abstraction (0/2) 1  ?Delayed Recall (0/5) 2  ?Orientation (0/6) 4  ?Total 15  ?Adjusted Score (based on education) 15  ? ?No flowsheet data found.  ?No flowsheet data found.  ? ?Orientation:  Alert and oriented to person, place not to time, year is 2223. No aphasia or dysarthria but speech is slow. Fund of knowledge is reduced. Recent and remote memory impaired  Attention and concentration are reduced   Able to name objects and repeat phrases. Delayed recall 2/5 ?Cranial nerves: There is good facial symmetry. Extraocular muscles are intact and visual fields are full to confrontational testing. Speech is fluent and clear. Soft palate rises symmetrically and there is no tongue deviation. Hearing is intact to conversational tone. ?Tone: Tone is good , increased on RUE, no cogwheeling ?Sensation: Sensation is intact to light  touch and pinprick throughout. Vibration is intact at the bilateral big toe.There is no extinction with double simultaneous stimulation. There is no sensory dermatomal level identified. ?Coordination: The patient

## 2022-01-14 ENCOUNTER — Encounter: Payer: Self-pay | Admitting: Interventional Cardiology

## 2022-01-14 DIAGNOSIS — Z9889 Other specified postprocedural states: Secondary | ICD-10-CM

## 2022-01-14 DIAGNOSIS — F0280 Dementia in other diseases classified elsewhere without behavioral disturbance: Secondary | ICD-10-CM

## 2022-01-14 HISTORY — DX: Dementia in other diseases classified elsewhere, unspecified severity, without behavioral disturbance, psychotic disturbance, mood disturbance, and anxiety: F02.80

## 2022-01-14 HISTORY — DX: Other specified postprocedural states: Z98.890

## 2022-01-14 LAB — POCT INR
INR: 2 (ref 2.0–3.0)
INR: 2.2 (ref 2.0–3.0)

## 2022-01-15 ENCOUNTER — Ambulatory Visit (INDEPENDENT_AMBULATORY_CARE_PROVIDER_SITE_OTHER): Payer: Medicare Other

## 2022-01-15 DIAGNOSIS — Z7901 Long term (current) use of anticoagulants: Secondary | ICD-10-CM

## 2022-01-15 NOTE — Progress Notes (Signed)
Continue 2 mg daily except take 4 mg on Sun, Tues, and Thurs and recheck in 1 week.  ?

## 2022-01-15 NOTE — Telephone Encounter (Signed)
Corky Crafts, MD    ? ?Can stop metoprolol and see if sx get better.  If she has fast HR off of the metoprolol, would add back metoprolol 12.5 mg daily.    ? ?

## 2022-01-15 NOTE — Patient Instructions (Addendum)
Pre visit review using our clinic review tool, if applicable. No additional management support is needed unless otherwise documented below in the visit note. ? ?Continue 2 mg daily except take 4 mg on Sun, Tues, and Thurs and recheck in 1 week.  ?

## 2022-01-19 ENCOUNTER — Encounter: Payer: Self-pay | Admitting: Obstetrics and Gynecology

## 2022-01-19 ENCOUNTER — Telehealth: Payer: Self-pay | Admitting: Interventional Cardiology

## 2022-01-19 ENCOUNTER — Other Ambulatory Visit: Payer: Self-pay

## 2022-01-19 ENCOUNTER — Ambulatory Visit: Payer: Medicare Other | Admitting: Obstetrics and Gynecology

## 2022-01-19 VITALS — BP 117/77 | HR 72

## 2022-01-19 DIAGNOSIS — N3281 Overactive bladder: Secondary | ICD-10-CM

## 2022-01-19 DIAGNOSIS — R3915 Urgency of urination: Secondary | ICD-10-CM

## 2022-01-19 DIAGNOSIS — N952 Postmenopausal atrophic vaginitis: Secondary | ICD-10-CM

## 2022-01-19 DIAGNOSIS — N390 Urinary tract infection, site not specified: Secondary | ICD-10-CM

## 2022-01-19 NOTE — Telephone Encounter (Signed)
I spoke with patient.  Heart rate has improved and she feels better since stopping metoprolol. She is due for follow up.  Appointment scheduled with K. Grandville Silos, [place] for January 21, 2022 at 10:55 ?

## 2022-01-19 NOTE — Progress Notes (Signed)
Mallory Estrada ?Return Visit ? ?SUBJECTIVE  ?History of Present Illness: ?Mallory Estrada is a 81 y.o. female seen in follow-up for estring replacement.  ? ?Past Medical History: ?Patient  has a past medical history of Arthritis, Atrial fibrillation ([place]), Depression, Gait abnormality, Hyperlipidemia, Hypothyroidism, Ophthalmoplegic migraine, and SUI (stress urinary incontinence, female).  ? ?Past Surgical History: ?She  has a past surgical history that includes Breast biopsy; Tonsillectomy and adenoidectomy; Back surgery; Hip surgery (Bilateral); and Knee surgery (Bilateral).  ? ?Medications: ?She has a current medication list which includes the following prescription(s): acetaminophen, atorvastatin, vitamin d3, estring, fosfomycin, furosemide, levothyroxine, magnesium gluconate, melatonin, memantine, polyethylene glycol powder, potassium chloride sa, and warfarin.  ? ?Allergies: ?Patient is allergic to fentanyl, gabapentin, ketamine, macrobid [nitrofurantoin], and sulfa antibiotics.  ? ?Social History: ?Patient  reports that she quit smoking about 51 years ago. Her smoking use included cigarettes. She has never used smokeless tobacco. She reports current alcohol use. She reports that she does not use drugs.  ?  ?  ?OBJECTIVE  ?  ? ?Physical Exam: ?Vitals:  ? 01/19/22 1143  ?BP: 117/77  ?Pulse: 72  ? ?Gen: No apparent distress, A&O x 3. ? ?Normal external genitalia. Estring grasped and removed. New estring replaced.  ? ?  ? ?ASSESSMENT AND PLAN  ?  ?Mallory Estrada is a 81 y.o. with:  ?1. Recurrent UTI   ?2. Vaginal atrophy   ? ?- will replace estring q 3 months.  ?- continue with fosfomycin for recurrent UTI q 10 days, for a total of 6 months.  ? ?Mallory Folds, MD ? ?Time spent: I spent 20 minutes dedicated to the care of this patient on the date of this encounter to include pre-visit review of records, face-to-face time with the patient and post visit documentation. ? ?

## 2022-01-19 NOTE — Telephone Encounter (Signed)
?  Per MyChart scheduling message: ? ?STAT if HR is under 50 or over 120 ?(normal HR is 60-100 beats per minute) ? ?What is your heart rate?  ? ?Do you have a log of your heart rate readings (document readings)?  ? ?Do you have any other symptoms?  ? ? ?I haven?t kept a heart rate log until I recently purchased a pulseoximeter. ?when visiting nurses and therapists have been visiting over the past few weeks they were commenting that it was often in the 40s and we remembered Dr Forest Gleason had lowered the metoprolol from 50 to 25 mgs when is was showing numbers like those last year so I sent a MyChart message and called for an office visit.  ?I was feeling dizzy sometimes when getting up and that has stopped since discontinuing 2 days ago.  ?I?m now checking 1-2 times a day and seeing 56-58 today  ?

## 2022-01-19 NOTE — Progress Notes (Signed)
[facility] ? ?PTNS VISIT ? ?CC:  Overactive bladder ? ?81 y.o. with refractory overactive bladder who presents for percutaneous tibial nerve stimulation. ?The patient presents for PTNS session # 10. ? ?Procedure: ?The patient spontaneously voided prior to beginning the procedure. The patient was placed in the sitting position and the right lower extremity was prepped in the usual fashion. The PTNS needle was then inserted at a 60 degree angle, 5 cm cephalad and 2 cm posterior to the medial malleolus. The PTNS unit was then programmed and an optimal response was noted at 5 milliamps. The PTNS stimulation was then performed at this setting for 30 minutes without incident and the patient tolerated the procedure well. The needle was removed and hemostasis was noted.  ? ?The pt will return in 1 week for PTNS session #11. ?All questions were answered. ? ?Marguerita Beards, MD ? ?

## 2022-01-20 NOTE — Progress Notes (Signed)
?Cardiology Office Note:   ? ?Date:  01/21/2022  ? ?ID:  Mallory Estrada, DOB 1940-11-24, MRN [id number] ? ?PCP:  Joaquim Nam, MD ?  ?[facility] Providers ?Cardiologist:  Lance Muss, MD    ? ?Referring MD: Joaquim Nam, MD  ? ?Follow up.  ? ?History of Present Illness:   ? ?Mallory Estrada is a 81 y.o. female with a hx of depression, HLD, hypothyroidism, migraines, chronic diastolic CHF and PAF who presents to clinic for follow up.  ? ?Per review of records, she was diagnosed with AFib after spinal surgery in 06/2019. Echo reportedly normal. Sounds like she had some heart failure as well. Has been maintained on Lasix. She has been on Coumadin since that time. She was also on Toprol XL 25mg  daily.  ? ?She moved from to [place] in 2022 and established care with Dr. 2023. ECG at that time showed sinus brady with HR 44.  ? ?She recently called into the office for evaluation of bradycardia and it was reccommended that she stop her Toprol XL.  ? ?Today the patient presents to clinic for follow up. Here with her daughter. When HRs are so low she had some dizziness. No recurrence since stopping Toprol XL. No CP or SOB. No LE edema, orthopnea or PND. No r syncope. No blood in stool or urine. No palpitations.  ? ? ? ?Past Medical History:  ?Diagnosis Date  ? Arthritis   ? Atrial fibrillation (HCC)   ? Noted postop.  ? Depression   ? Gait abnormality   ? Hyperlipidemia   ? Hypothyroidism   ? Ophthalmoplegic migraine   ? SUI (stress urinary incontinence, female)   ? ? ?Past Surgical History:  ?Procedure Laterality Date  ? BACK SURGERY    ? BREAST BIOPSY    ? HIP SURGERY Bilateral   ? KNEE SURGERY Bilateral   ? TONSILLECTOMY AND ADENOIDECTOMY    ? ? ?Current Medications: ?Current Meds  ?Medication Sig  ? acetaminophen (TYLENOL) 500 MG tablet Take 500 mg by mouth every 6 (six) hours as needed.  ? atorvastatin (LIPITOR) 10 MG tablet Take 1 tablet (10 mg total) by mouth daily.  ? Cholecalciferol  (VITAMIN D3) 125 MCG (5000 UT) CAPS Take by mouth.  ? estradiol (ESTRING) 2 MG vaginal ring Place 2 mg vaginally every 3 (three) months. follow package directions  ? fosfomycin (MONUROL) 3 g PACK 1 dose every 10 days.  ? furosemide (LASIX) 20 MG tablet 1 tab a day.  If ankle swelling, take 2 tabs on MWF if needed.  ? levothyroxine (SYNTHROID) 50 MCG tablet NEW PRESCRIPTION REQUEST: TAKE ONE TABLET BY MOUTH 30 MINUTES BEFORE BREAKFAST  ? magnesium gluconate (MAGONATE) 500 MG tablet Take 500 mg by mouth at bedtime.  ? Melatonin 1 MG CHEW Chew by mouth.  ? memantine (NAMENDA) 5 MG tablet Take 1 tablet (5 mg at night) for 2 weeks, then increase to 1 tablet 5 mg) twice a day  ? metoprolol succinate (TOPROL XL) 25 MG 24 hr tablet Take 0.5 tablets (12.5 mg total) by mouth as needed (ELEVATED HEART RATE).  ? polyethylene glycol powder (GLYCOLAX/MIRALAX) 17 GM/SCOOP powder Take 17 g by mouth daily as needed (for constipation).  ? potassium chloride SA (KLOR-CON) 20 MEQ tablet NEW PRESCRIPTION REQUEST: TAKE ONE TABLET BY MOUTH EVERY DAY  ? warfarin (COUMADIN) 2 MG tablet TAKE 2 TABLETS DAILY BY MOUTH, EXCEPT TAKE 1 TABLET ON MONDAYS, WEDNESDAYS AND FRIDAYS OR AS DIRECTED BY  COUMADIN CLINIC  ?  ? ?Allergies:   Fentanyl, Gabapentin, Ketamine, Macrobid [nitrofurantoin], and Sulfa antibiotics  ? ?Social History  ? ?Socioeconomic History  ? Marital status: Widowed  ?  Spouse name: Not on file  ? Number of children: 3  ? Years of education: 57  ? Highest education level: Not on file  ?Occupational History  ? Not on file  ?Tobacco Use  ? Smoking status: Former  ?  Types: Cigarettes  ?  Quit date: 11/01/1970  ?  Years since quitting: 51.2  ? Smokeless tobacco: Never  ?Vaping Use  ? Vaping Use: Never used  ?Substance and Sexual Activity  ? Alcohol use: Yes  ?  Comment: rare  ? Drug use: Never  ? Sexual activity: Not Currently  ?Other Topics Concern  ? Not on file  ?Social History Narrative  ? From [place]  ? Moved to [facility] 2022   ?  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    ? Widowed.  ? Attended college in [place]  ? ?Social Determinants of Health  ? ?Financial Resource Strain: Not on file  ?Food Insecurity: Not on file  ?Transportation Needs: Not on file  ?Physical Activity: Not on file  ?Stress: Not on file  ?Social Connections: Not on file  ?  ? ?Family History: ?The patient's family history includes Arthritis in her father, mother, and sister; Breast cancer in her maternal aunt; Colon cancer in her brother; Diabetes in her father; Heart attack in her brother; Heart disease in her brother and father. ? ?ROS:   ?Please see the history of present illness.    ?All other systems reviewed and are negative. ? ?EKGs/Labs/Other Studies Reviewed:   ? ?The following studies were reviewed today: ? ?none ? ?EKG:  EKG is ordered today.  The ekg ordered today demonstrates sinus HR 79 ? ?Recent Labs: ?11/23/2021: ALT 10; BUN 34; Creatinine, Ser 1.17; Hemoglobin 13.3; Platelets 221.0; Potassium 4.2; Sodium 143; TSH 1.05  ?Recent Lipid Panel ?   ?Component Value Date/Time  ? CHOL 152 11/23/2021 1458  ? TRIG 150.0 (H) 11/23/2021 1458  ? HDL 46.80 11/23/2021 1458  ? CHOLHDL 3 11/23/2021 1458  ? VLDL 30.0 11/23/2021 1458  ? LDLCALC 75 11/23/2021 1458  ? ? ? ?Risk Assessment/Calculations:   ? ?CHA2DS2-VASc Score = 5  ? This indicates a 7.2% annual risk of stroke. ?The patient's score is based upon: ?CHF History: 1 ?HTN History: 1 ?Diabetes History: 0 ?Stroke History: 0 ?Vascular Disease History: 0 ?Age Score: 2 ?Gender Score: 1 ?  ? ? ?    ? ?Physical Exam:   ? ?VS:  BP 110/70   Pulse 73   Ht 5\' 5"  (1.651 m)   Wt 180 lb (81.6 kg)   SpO2 98%   BMI 29.95 kg/m?    ? ?Wt Readings from Last 3 Encounters:  ?01/21/22 180 lb (81.6 kg)  ?11/23/21 185 lb (83.9 kg)  ?10/29/21 190 lb (86.2 kg)  ?  ? ?GEN:  Well nourished, well developed in no acute distress ?HEENT: Normal ?NECK: No JVD;  ?LYMPHATICS: No lymphadenopathy ?CARDIAC: RRR, no  murmurs, rubs, gallops ?RESPIRATORY:  Clear to auscultation without rales, wheezing or rhonchi  ?ABDOMEN: Soft, non-tender, non-distended ?MUSCULOSKELETAL:  No edema; No deformity  ?SKIN: Warm and dry ?NEUROLOGIC:  Alert and oriented x 3 ?PSYCHIATRIC:  Normal affect  ? ?ASSESSMENT:   ? ?1. Paroxysmal atrial fibrillation (HCC)   ?2. Chronic diastolic heart failure (HCC)   ?3. Bradycardia   ? ?  PLAN:   ? ?In order of problems listed above: ? ?PAF: maintaining sinus today. HRs were in 40-50s assoicatied with dizziness and it was recommended that she stop her Toprol 25mg  daily. She feels much better and HR in 70s off of it. Will put back on her med list to take as needed in the case she goes back into afib (this is asymptomatic but she regularly checks her pulse). Continue on coumadin ? ?Chronic diastolic CHF: appears euvolemic. No changes made.  ? ?Sinus bradycardia: resolved with discontinuation of Toprol XL. This can be taken on an as need basis.  ? ? ?Medication Adjustments/Labs and Tests Ordered: ?Current medicines are reviewed at length with the patient today.  Concerns regarding medicines are outlined above.  ?Orders Placed This Encounter  ?Procedures  ? EKG 12-Lead  ? ?Meds ordered this encounter  ?Medications  ? metoprolol succinate (TOPROL XL) 25 MG 24 hr tablet  ?  Sig: Take 0.5 tablets (12.5 mg total) by mouth as needed (ELEVATED HEART RATE).  ?  Dispense:  30 tablet  ?  Refill:  11  ? ? ?Patient Instructions  ?Medication Instructions:  ?Your physician has recommended you make the following change in your medication:  ?TAKE METOPROLOL 25 MG (1/2 TO 1 TABLET) AS NEED FOR ELEVATED HEART RATE.   ?*If you need a refill on your cardiac medications before your next appointment, please call your pharmacy* ? ? ?Lab Work: ?NONE ?If you have labs (blood work) drawn today and your tests are completely normal, you will receive your results only by: ?MyChart Message (if you have MyChart) OR ?A paper copy in the mail ?If  you have any lab test that is abnormal or we need to change your treatment, we will call you to review the results. ? ? ?Testing/Procedures: ?NONE ? ? ?Follow-Up: ?At [facility], you and your health needs are our priority.

## 2022-01-21 ENCOUNTER — Ambulatory Visit: Payer: Medicare Other | Admitting: Psychology

## 2022-01-21 ENCOUNTER — Encounter: Payer: Self-pay | Admitting: Physician Assistant

## 2022-01-21 ENCOUNTER — Ambulatory Visit (INDEPENDENT_AMBULATORY_CARE_PROVIDER_SITE_OTHER): Payer: Medicare Other

## 2022-01-21 ENCOUNTER — Other Ambulatory Visit: Payer: Self-pay

## 2022-01-21 ENCOUNTER — Ambulatory Visit: Payer: Medicare Other | Admitting: Physician Assistant

## 2022-01-21 VITALS — BP 110/70 | HR 73 | Ht 65.0 in | Wt 180.0 lb

## 2022-01-21 DIAGNOSIS — I5032 Chronic diastolic (congestive) heart failure: Secondary | ICD-10-CM

## 2022-01-21 DIAGNOSIS — R001 Bradycardia, unspecified: Secondary | ICD-10-CM

## 2022-01-21 DIAGNOSIS — I480 Paroxysmal atrial fibrillation: Secondary | ICD-10-CM | POA: Diagnosis not present

## 2022-01-21 DIAGNOSIS — Z7901 Long term (current) use of anticoagulants: Secondary | ICD-10-CM

## 2022-01-21 LAB — POCT INR: INR: 1.6 — AB (ref 2.0–3.0)

## 2022-01-21 MED ORDER — METOPROLOL SUCCINATE ER 25 MG PO TB24: 12.5000 mg | ORAL_TABLET | ORAL | 11 refills | Status: DC | PRN

## 2022-01-21 NOTE — Patient Instructions (Addendum)
Pre visit review using our clinic review tool, if applicable. No additional management support is needed unless otherwise documented below in the visit note. ? ?Increase dose today to take 6 mg and increase dose tomorrow to take 3 mg and then change weekly dose to take 4 mg daily except take 2 mg on Mon, Wed, and Fridays. ?

## 2022-01-21 NOTE — Patient Instructions (Addendum)
Medication Instructions:  ?Your physician has recommended you make the following change in your medication:  ?TAKE METOPROLOL 25 MG (1/2 TO 1 TABLET) AS NEED FOR ELEVATED HEART RATE.   ?*If you need a refill on your cardiac medications before your next appointment, please call your pharmacy* ? ? ?Lab Work: ?NONE ?If you have labs (blood work) drawn today and your tests are completely normal, you will receive your results only by: ?MyChart Message (if you have MyChart) OR ?A paper copy in the mail ?If you have any lab test that is abnormal or we need to change your treatment, we will call you to review the results. ? ? ?Testing/Procedures: ?NONE ? ? ?Follow-Up: ?At [facility], you and your health needs are our priority.  As part of our continuing mission to provide you with exceptional heart care, we have created designated Provider Care Teams.  These Care Teams include your primary Cardiologist (physician) and Advanced Practice Providers (APPs -  Physician Assistants and Nurse Practitioners) who all work together to provide you with the care you need, when you need it. ? ?We recommend signing up for the patient portal called "MyChart".  Sign up information is provided on this After Visit Summary.  MyChart is used to connect with patients for Virtual Visits (Telemedicine).  Patients are able to view lab/test results, encounter notes, upcoming appointments, etc.  Non-urgent messages can be sent to your provider as well.   ?To learn more about what you can do with MyChart, go to [email or web address].   ? ?Your next appointment:   ?9 month(s) ? ?The format for your next appointment:   ?In Person ? ?Provider:   ?Larae Grooms, MD { ? ?

## 2022-01-21 NOTE — Progress Notes (Signed)
INR result of 1.6 for today was received by VM from pt's daughter. She reports they are now eating spinach at least 3 times weekly so the warfarin dose may need adjusted.  ?Increase dose today to take 6 mg and increase dose tomorrow to take 3 mg and then change weekly dose to take 4 mg daily except take 2 mg on Mon, Wed, and Fridays. Pt's daughter does not want AVS mailed. Pt's daughter verbalized understanding.  ?

## 2022-01-25 ENCOUNTER — Encounter: Payer: Self-pay | Admitting: Physician Assistant

## 2022-01-25 DIAGNOSIS — M48062 Spinal stenosis, lumbar region with neurogenic claudication: Secondary | ICD-10-CM | POA: Diagnosis not present

## 2022-01-25 DIAGNOSIS — M4316 Spondylolisthesis, lumbar region: Secondary | ICD-10-CM | POA: Diagnosis not present

## 2022-01-25 DIAGNOSIS — Z6830 Body mass index (BMI) 30.0-30.9, adult: Secondary | ICD-10-CM | POA: Diagnosis not present

## 2022-01-26 ENCOUNTER — Telehealth: Payer: Self-pay | Admitting: Family Medicine

## 2022-01-26 ENCOUNTER — Ambulatory Visit: Payer: Medicare Other | Admitting: Obstetrics and Gynecology

## 2022-01-26 NOTE — Telephone Encounter (Signed)
Tonya with [facility] [employer] called wanting Dr Damita Dunnings to know that pt fell 2 times this weekend. Kenney Houseman states that pt is doing fine.  ?

## 2022-01-27 ENCOUNTER — Other Ambulatory Visit: Payer: Self-pay | Admitting: Neurosurgery

## 2022-01-27 DIAGNOSIS — M48062 Spinal stenosis, lumbar region with neurogenic claudication: Secondary | ICD-10-CM

## 2022-01-27 NOTE — Telephone Encounter (Signed)
Please check on patient/family and see if there is any contributing factor that we can address.  Thanks. ?

## 2022-01-27 NOTE — Telephone Encounter (Signed)
[name] stated that her mom has had 3 big falls in the last 4 days due to a new medication that was started. Patient was taking namenda and caused her dizziness. They stopped that on Sunday and informed Wertman's office about this and that this med was stopped. She is waiting on a response from her but their office is aware of what has been going on. Patient just has some bruises and little scrapes and is doing fine. Nothing needing any care or addressing at this time. Daughter did want to schedule a f/u for after patients MRI on 02/08/22 to discuss things and that appt is scheduled for 02/15/22. ?

## 2022-01-27 NOTE — Telephone Encounter (Signed)
LMTCB

## 2022-01-27 NOTE — Telephone Encounter (Signed)
Noted. Thanks.

## 2022-01-28 ENCOUNTER — Ambulatory Visit (INDEPENDENT_AMBULATORY_CARE_PROVIDER_SITE_OTHER): Payer: Medicare Other

## 2022-01-28 DIAGNOSIS — Z7901 Long term (current) use of anticoagulants: Secondary | ICD-10-CM

## 2022-01-28 LAB — POCT INR: INR: 2.3 (ref 2.0–3.0)

## 2022-01-28 NOTE — Patient Instructions (Addendum)
Pre visit review using our clinic review tool, if applicable. No additional management support is needed unless otherwise documented below in the visit note. ? ?Continue 4 mg daily except take 2 mg on Mon, Wed, and Fridays. Recheck in 1 week. ?

## 2022-01-28 NOTE — Progress Notes (Signed)
Continue 4 mg daily except take 2 mg on Mon, Wed, and Fridays. Recheck in 1 week. ? ?=========== ?Agree.  Thanks.  ?Crawford Givens ? ?

## 2022-01-29 ENCOUNTER — Telehealth: Payer: Self-pay | Admitting: Obstetrics and Gynecology

## 2022-02-02 NOTE — Telephone Encounter (Signed)
completed

## 2022-02-03 ENCOUNTER — Other Ambulatory Visit: Payer: Self-pay | Admitting: Family Medicine

## 2022-02-03 ENCOUNTER — Ambulatory Visit: Payer: Medicare Other | Admitting: Obstetrics and Gynecology

## 2022-02-03 ENCOUNTER — Encounter: Payer: Self-pay | Admitting: Family Medicine

## 2022-02-03 ENCOUNTER — Ambulatory Visit (INDEPENDENT_AMBULATORY_CARE_PROVIDER_SITE_OTHER): Payer: Medicare Other

## 2022-02-03 DIAGNOSIS — Z7901 Long term (current) use of anticoagulants: Secondary | ICD-10-CM

## 2022-02-03 LAB — POCT INR: INR: 1.7 — AB (ref 2.0–3.0)

## 2022-02-03 MED ORDER — DOXYLAMINE SUCCINATE (SLEEP) 25 MG PO TABS: 25.0000 mg | ORAL_TABLET | Freq: Every evening | ORAL | Status: DC | PRN

## 2022-02-03 NOTE — Patient Instructions (Signed)
Pre visit review using our clinic review tool, if applicable. No additional management support is needed unless otherwise documented below in the visit note. 

## 2022-02-03 NOTE — Progress Notes (Signed)
Pt is still not sleeping well with waking several times a night. Pt's daughter sent a msg to the PCP to see if any medication could help this.  ? ?INR result of 1.7 for today was received by [facility] website.  ?Increase dose today to take 3 mg and then continue 4 mg daily except take 2 mg on Mon, Wed, and Fridays. Recheck in 1 week. Pt's daughter does not want AVS mailed. Pt's daughter verbalized understanding.  ?

## 2022-02-08 ENCOUNTER — Ambulatory Visit: Payer: Medicare Other | Admitting: Obstetrics and Gynecology

## 2022-02-08 ENCOUNTER — Ambulatory Visit
Admission: RE | Admit: 2022-02-08 | Discharge: 2022-02-08 | Disposition: A | Discharge status: Home / Self Care | Payer: Medicare Other | Source: Ambulatory Visit | Attending: Physician Assistant | Admitting: Physician Assistant

## 2022-02-08 DIAGNOSIS — I6782 Cerebral ischemia: Secondary | ICD-10-CM | POA: Diagnosis not present

## 2022-02-08 DIAGNOSIS — R413 Other amnesia: Secondary | ICD-10-CM | POA: Diagnosis not present

## 2022-02-08 DIAGNOSIS — G319 Degenerative disease of nervous system, unspecified: Secondary | ICD-10-CM | POA: Diagnosis not present

## 2022-02-08 MED ORDER — GADOBENATE DIMEGLUMINE 529 MG/ML IV SOLN: 17.0000 mL | Freq: Once | INTRAVENOUS | Status: DC | PRN

## 2022-02-09 NOTE — Progress Notes (Signed)
Please, inform patient that the MRI results show chronic aging changes in the vessels and age related atrophy.No stroke, masses, fluid or infection is seen. Thank you

## 2022-02-11 ENCOUNTER — Ambulatory Visit (INDEPENDENT_AMBULATORY_CARE_PROVIDER_SITE_OTHER): Payer: Medicare Other

## 2022-02-11 DIAGNOSIS — Z7901 Long term (current) use of anticoagulants: Secondary | ICD-10-CM | POA: Diagnosis not present

## 2022-02-11 LAB — POCT INR: INR: 1.7 — AB (ref 2.0–3.0)

## 2022-02-11 NOTE — Progress Notes (Signed)
INR result of 1.7 for today was received by [facility] website.  ?Increase dose today to take 6 mg and then change weekly dose to take 4 mg daily except take 2 mg on Mondays and Fridays. Recheck in 1 week. Pt's daughter does not want AVS mailed. Pt's daughter verbalized understanding.  ?

## 2022-02-11 NOTE — Patient Instructions (Addendum)
Pre visit review using our clinic review tool, if applicable. No additional management support is needed unless otherwise documented below in the visit note. ? ? ?Increase dose today to take 6 mg and then change weekly dose to take 4 mg daily except take 2 mg on Mondays and Fridays. Recheck in 1 week.  ?

## 2022-02-15 ENCOUNTER — Encounter: Payer: Self-pay | Admitting: Family Medicine

## 2022-02-15 ENCOUNTER — Ambulatory Visit (INDEPENDENT_AMBULATORY_CARE_PROVIDER_SITE_OTHER): Payer: Medicare Other | Admitting: Family Medicine

## 2022-02-15 DIAGNOSIS — G4700 Insomnia, unspecified: Secondary | ICD-10-CM

## 2022-02-15 NOTE — Patient Instructions (Signed)
You could try taking lasix about 3 PM, after a nap- instead of taking in the AM.  ?Take care.  Glad to see you. ?I would still use unisom at night if needed.   ?

## 2022-02-15 NOTE — Progress Notes (Signed)
She didn't tolerate namenda.  Less falls off med.  Daughter is helping out, staying with patient.  Bruising still noted on the R distal triceps area but this is slowly improving per patient report.  She still has normal elbow range of motion. ? ?Sleep d/w pt.  Waking up to go to the bathroom, with beside commode.  Able to get back to sleep.  Unisom helped some but we talked about not increasing the dose. ? ?She has a bell to call for help at home when needed.   ? ?We talked about swallowing precautions.  She had been using a straw and it was unclear if that was more difficult for her to drink.  We talked about not using a straw and then updating me as needed.  We agreed to defer speech therapy evaluation in the meantime but we can do that in the future if needed. ? ?Meds, vitals, and allergies reviewed.  ? ?ROS: Per HPI unless specifically indicated in ROS section  ? ?GEN: nad, alert and oriented ?HEENT: ncat ?NECK: supple w/o LA ?CV: rrr.   ?PULM: ctab, no inc wob ?ABD: soft, +bs ?EXT: no edema ?SKIN: Bruising noted on the distal right triceps area.  Normal elbow range of motion. ? ?30 minutes were devoted to patient care in this encounter (this includes time spent reviewing the patient's file/history, interviewing and examining the patient, counseling/reviewing plan with patient).  ? ?

## 2022-02-16 DIAGNOSIS — F330 Major depressive disorder, recurrent, mild: Secondary | ICD-10-CM | POA: Diagnosis not present

## 2022-02-18 ENCOUNTER — Ambulatory Visit (INDEPENDENT_AMBULATORY_CARE_PROVIDER_SITE_OTHER): Payer: Medicare Other

## 2022-02-18 ENCOUNTER — Ambulatory Visit: Payer: Medicare Other | Admitting: Physician Assistant

## 2022-02-18 ENCOUNTER — Encounter: Payer: Self-pay | Admitting: Physician Assistant

## 2022-02-18 ENCOUNTER — Ambulatory Visit: Payer: Medicare Other | Admitting: Psychology

## 2022-02-18 VITALS — BP 119/77 | HR 71 | Resp 18 | Ht 64.5 in | Wt 178.0 lb

## 2022-02-18 DIAGNOSIS — G309 Alzheimer's disease, unspecified: Secondary | ICD-10-CM | POA: Diagnosis not present

## 2022-02-18 DIAGNOSIS — R413 Other amnesia: Secondary | ICD-10-CM | POA: Diagnosis not present

## 2022-02-18 DIAGNOSIS — Z7901 Long term (current) use of anticoagulants: Secondary | ICD-10-CM | POA: Diagnosis not present

## 2022-02-18 DIAGNOSIS — G4700 Insomnia, unspecified: Secondary | ICD-10-CM | POA: Insufficient documentation

## 2022-02-18 DIAGNOSIS — F0280 Dementia in other diseases classified elsewhere without behavioral disturbance: Secondary | ICD-10-CM | POA: Diagnosis not present

## 2022-02-18 DIAGNOSIS — I482 Chronic atrial fibrillation, unspecified: Secondary | ICD-10-CM

## 2022-02-18 HISTORY — DX: Insomnia, unspecified: G47.00

## 2022-02-18 LAB — POCT INR: INR: 1.8 — AB (ref 2.0–3.0)

## 2022-02-18 NOTE — Progress Notes (Signed)
? ?Assessment/Plan:  ? 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 ?Patient was unable to receive ACHI due to a history of bradycardia, and was unable to tolerate memantine due to dizziness.  Her last MoCA on 01/14/2022 was 15/30.  Neurocognitive testing is scheduled for November 2023.  Patient's daughter is interested in obtaining a second opinion at a tertiary center, to determine if there are any other research medicines-options that her mother can be a candidate for.  In today's visit, no significant cognitive changes are noted since her visit in March of this year. ? ? Recommendations:  ? ?Discussed safety both in and out of the home.  ?Discussed the importance of regular daily schedule with inclusion of crossword puzzles to maintain brain function.  ?Continue to monitor mood by PCP ?Stay active at least 30 minutes at least 3 times a week.  ?Naps should be scheduled and should be no longer than 60 minutes and should not occur after 2 PM.  ?Mediterranean diet is recommended  ?Control cardiovascular risk factors  ?No available antidementia medication is recommended at this time, unable to tolerate Memantine due to dizziness, and unable to take ACHI due to history of bradycardia.  Agree with second opinion, and perhaps a candidacy for a clinical trial on any other antidementia medication.  Will refer to [facility] school [facility] medicine memory clinic ?Continue PT/OT ?Follow up after neurocognitive testing, unless patient decides to continue care at [facility] if a candidate for any other therapy. ? ?Case discussed with Dr. Karel Jarvis who agrees with the plan ? ? ? ?Subjective:  ? ? ?Mallory Estrada is a very pleasant 81 y.o. RH female   with a history of Arthritis, Atrial fibrillation, Depression, Hyperlipidemia, Hypothyroidism, Ophthalmoplegic migraine, Chronic back pain s/p multiple surgical procedures seen today for memory loss. This patient is accompanied in the office by her daughter who supplements the  history.  Previous records as well as any outside records available were reviewed prior to todays visit.  Patient was last seen at our office on 01/14/2022 at which time her MoCA was 15/30.  MRI of the brain on 02/08/2022 was remarkable for minimal chronic small vessel disease, and mild to moderate cerebral atrophy and comparatively mild cerebellar atrophy. ?She is not on antidementia medications, due to her history of bradycardia, and side effects from memantine which provoked significant dizziness.  The patient is accompanied by her daughter who supplements the history.    ?Since last visit, patient reports that memory is about the same.  She continues to occasionally repeat herself. ?She denies being disoriented when walking into her room and denies living objects in unusual places but sometimes she is looking for the remote control and is in her hands. She ambulates with some difficulty.  She had significant history of degenerative disease in her back, having undergone several surgeries, including an L4-L5 compression fracture fusion, as well as a T12 L1-L2 fusion for DJD, which causes her ambulation issues, as well as gait instability.  The patient has been seen by neurosurgery while in [place], apparently, she was seen by neurosurgery here for continuation of care, but was told that no follow-up was needed unless she had pain.  She denies vertigo, and after discontinuing memantine, her dizziness resolved.  While on memantine, she sustained a couple of falls, with bruising in her arm and elbow, without head injury or loss of consciousness.  She denies numbness or tingling, or unilateral weakness or tremors.  Her mood is stable, denies  depression or irritability.  She is on psychotherapy to deal with the stress of having moved from another place. She has very vivid dreams, but this has been present "throughout her life, she acts out in her dreams, imagining things, such as other people in her bed usually in  strangers ", but denies hallucinations "these are vivid dreams ".  Her daughter states that she laughs at herself about these issues.  "She brings dream world into reality ".  She is unsure of sleepwalking, although sometimes "the walker is way back in the bed and she went to the bathroom without noticing ".  Denies paranoia.  There are no hygiene concerns, she may need some assistance with bathing and dressing.  Her medications are in a pillbox, her daughter supervises.  Daughter is in charge of the finances, and  cooking.  Her appetite is good, denies trouble swallowing.  She denies any headaches, but reports that at times, her scalp hurts to the touch "denies double vision, anosmia, or history of seizures.  She denies urine incontinence, retention, constipation or diarrhea.  In the past, she may have been diagnosed with OSA, but never used CPAP.  She denies alcohol or tobacco history.  No family history of dementia. ? ? Initial Visit 01/13/22 The patient is seen in neurologic consultation at the request of Joaquim Namuncan, Graham S, MD for the evaluation of memory.  The patient is accompanied by her daughter who supplements the history.  The patient has recently moved from [place] to live with her daughter.  ?This is a 81 y.o. year old RH  female who has had memory issues for about 2 or 3 years, after her daughter noticed that she could not hold the letters correctly, and her speech was slower.  She also was more repetitive in her questions, such as "are you going to be here in the morning "several times.  Occasionally she repeats herself.  She denies being disoriented when walking into her room.  She denies living objects in unusual places but sometimes she is looking for the remote control and is in her hands.  The patient had been seen while in [place] by neurology regarding these issues, but she cannot recall the details, or the formal diagnosis.  Records have been requested.  She ambulates with some difficulty.  She had  significant history of degenerative disease in her back, having undergone several surgeries, including an L4-L5 compression fracture fusion, as well as a T12 L1-L2 fusion for DJD, which causes her ambulation issues, as well as gait instability.  The patient has been seen by neurosurgery while in [place], and needs to reestablish care here.  She denies vertigo, dizziness, syncope or presyncope.  She does have issues with back pain.  She denies numbness or tingling, or unilateral weakness or tremors.  Her last fall was in 2020, when she fell off the porch, never going to the hospital.  Her mood is stable, denies depression or irritability.  She is on psychotherapy to deal with the stress of having moved from another place.  According to her daughter, she may have some sundowning.  She has very vivid dreams, but this has been present "throughout her life, she acts out in her dreams, imagining things, such as other people in her bed usually in strangers ", but denies hallucinations "these are vivid dreams ".  Her daughter states that she laughs at herself about these issues.  "She brings dream world into reality ".  She is unsure of  sleepwalking, although sometimes "the walker is way back in the bed and she went to the bathroom without noticing ".  Denies paranoia.  There are no hygiene concerns, she may need some assistance with bathing and dressing.  Her medications are in a pillbox, her daughter supervises.  Daughter is in charge of the finances, and cooking. Her appetite is good, denies trouble swallowing.  She denies any headaches, but reports that at times, her scalp hurts to the touch "denies double vision, anosmia, or history of seizures.  She denies urine incontinence, retention, constipation or diarrhea.  In the past, she may have been diagnosed with OSA, but never used CPAP.  She denies alcohol or tobacco history.  No family history of dementia. ? ? ? MRI brain 02/08/22 No evidence of acute intracranial  abnormality. Minimal chronic small-vessel ischemic changes within the cerebralwhite matter. Mild-to-moderate generalized cerebral atrophy. Comparatively mild cerebellar atrophy. ?  ?PREVIOUS MEDICATIONS:  ? ?CURRENT MEDICA

## 2022-02-18 NOTE — Assessment & Plan Note (Signed)
We talked about options.  Insomnia is influenced by nocturia.  She could try taking Lasix at around 3 PM and see if that helps her "dry out" and decrease nocturia and help with sleep.  She can still use Unisom at night and she will update me as needed.  She agrees to plan.  Fall cautions discussed with patient. ?

## 2022-02-18 NOTE — Patient Instructions (Addendum)
It was a pleasure to see you today at our office.  ? ?Recommendations: ? ?Neurocognitive evaluation at our office ?Referral to [facility] for second opinion on memory issues  ?Feel free to go to the following database for funded clinical studies conducted around the world: ?RankChecks.se   https://www.triadclinicaltrials.com/ ? Follow up on Dec 4 at 11:30  ? ?RECOMMENDATIONS FOR ALL PATIENTS WITH MEMORY PROBLEMS: ?1. Continue to exercise (Recommend 30 minutes of walking everyday, or 3 hours every week) ?2. Increase social interactions - continue going to [place] and enjoy social gatherings with friends and family ?3. Eat healthy, avoid fried foods and eat more fruits and vegetables ?4. Maintain adequate blood pressure, blood sugar, and blood cholesterol level. Reducing the risk of stroke and cardiovascular disease also helps promoting better memory. ?5. Avoid stressful situations. Live a simple life and avoid aggravations. Organize your time and prepare for the next day in anticipation. ?6. Sleep well, avoid any interruptions of sleep and avoid any distractions in the bedroom that may interfere with adequate sleep quality ?7. Avoid sugar, avoid sweets as there is a strong link between excessive sugar intake, diabetes, and cognitive impairment ?We discussed the Mediterranean diet, which has been shown to help patients reduce the risk of progressive memory disorders and reduces cardiovascular risk. This includes eating fish, eat fruits and green leafy vegetables, nuts like almonds and hazelnuts, walnuts, and also use olive oil. Avoid fast foods and fried foods as much as possible. Avoid sweets and sugar as sugar use has been linked to worsening of memory function. ? ?There is always a concern of gradual progression of memory problems. If this is the case, then we may need to adjust level of care according to patient needs. Support, both to the patient and caregiver, should then be put into place.  ? ? ? ? ?You  have been referred for a neuropsychological evaluation (i.e., evaluation of memory and thinking abilities). Please bring someone with you to this appointment if possible, as it is helpful for the doctor to hear from both you and another adult who knows you well. Please bring eyeglasses and hearing aids if you wear them.  ?  ?The evaluation will take approximately 3 hours and has two parts: ?  ?The first part is a clinical interview with the neuropsychologist (Dr. Milbert Coulter or Dr. Roseanne Reno). During the interview, the neuropsychologist will speak with you and the individual you brought to the appointment.  ?  ?The second part of the evaluation is testing with the doctor's technician Annabelle Harman or Selena Batten). During the testing, the technician will ask you to remember different types of material, solve problems, and answer some questionnaires. Your family member will not be present for this portion of the evaluation. ?  ?Please note: We must reserve several hours of the neuropsychologist's time and the psychometrician's time for your evaluation appointment. As such, there is a No-Show fee of $100. If you are unable to attend any of your appointments, please contact our office as soon as possible to reschedule.  ? ? ?FALL PRECAUTIONS: Be cautious when walking. Scan the area for obstacles that may increase the risk of trips and falls. When getting up in the mornings, sit up at the edge of the bed for a few minutes before getting out of bed. Consider elevating the bed at the head end to avoid drop of blood pressure when getting up. Walk always in a well-lit room (use night lights in the walls). Avoid area rugs or power cords  from appliances in the middle of the walkways. Use a walker or a cane if necessary and consider physical therapy for balance exercise. Get your eyesight checked regularly. ? ?FINANCIAL OVERSIGHT: Supervision, especially oversight when making financial decisions or transactions is also recommended. ? ?HOME SAFETY:  Consider the safety of the kitchen when operating appliances like stoves, microwave oven, and blender. Consider having supervision and share cooking responsibilities until no longer able to participate in those. Accidents with firearms and other hazards in the house should be identified and addressed as well. ? ? ?ABILITY TO BE LEFT ALONE: If patient is unable to contact 911 operator, consider using LifeLine, or when the need is there, arrange for someone to stay with patients. Smoking is a fire hazard, consider supervision or cessation. Risk of wandering should be assessed by caregiver and if detected at any point, supervision and safe proof recommendations should be instituted. ? ?MEDICATION SUPERVISION: Inability to self-administer medication needs to be constantly addressed. Implement a mechanism to ensure safe administration of the medications. ? ? ?DRIVING: Regarding driving, in patients with progressive memory problems, driving will be impaired. We advise to have someone else do the driving if trouble finding directions or if minor accidents are reported. Independent driving assessment is available to determine safety of driving. ? ? ?If you are interested in the driving assessment, you can contact the following: ? ?[employer] [place] [phone] ? ?[facility] [phone] ? ?[facility] [phone] ? ?[facility] [phone] or [phone] ? ? ? ?Mediterranean Diet ?A Mediterranean diet refers to food and lifestyle choices that are based on the traditions of countries located on the [place]. This way of eating has been shown to help prevent certain conditions and improve outcomes for people who have chronic diseases, like kidney disease and heart disease. ?What are tips for following this plan? ?Lifestyle  ?Cook and eat meals together with your family, when possible. ?Drink enough fluid to keep your urine clear or pale yellow. ?Be  physically active every day. This includes: ?Aerobic exercise like running or swimming. ?Leisure activities like gardening, walking, or housework. ?Get 7-8 hours of sleep each night. ?If recommended by your health care provider, drink red wine in moderation. This means 1 glass a day for nonpregnant women and 2 glasses a day for men. A glass of wine equals 5 oz (150 mL). ?Reading food labels  ?Check the serving size of packaged foods. For foods such as rice and pasta, the serving size refers to the amount of cooked product, not dry. ?Check the total fat in packaged foods. Avoid foods that have saturated fat or trans fats. ?Check the ingredients list for added sugars, such as corn syrup. ?Shopping  ?At the grocery store, buy most of your food from the areas near the walls of the store. This includes: ?Fresh fruits and vegetables (produce). ?Grains, beans, nuts, and seeds. Some of these may be available in unpackaged forms or large amounts (in bulk). ?Fresh seafood. ?Poultry and eggs. ?Low-fat dairy products. ?Buy whole ingredients instead of prepackaged foods. ?Buy fresh fruits and vegetables in-season from local farmers markets. ?Buy frozen fruits and vegetables in resealable bags. ?If you do not have access to quality fresh seafood, buy precooked frozen shrimp or canned fish, such as tuna, salmon, or sardines. ?Buy small amounts of raw or cooked vegetables, salads, or olives from the deli or salad bar at your store. ?Stock your pantry so you always have certain foods on hand, such  as olive oil, canned tuna, canned tomatoes, rice, pasta, and beans. ?Cooking  ?Cook foods with extra-virgin olive oil instead of using butter or other vegetable oils. ?Have meat as a side dish, and have vegetables or grains as your main dish. This means having meat in small portions or adding small amounts of meat to foods like pasta or stew. ?Use beans or vegetables instead of meat in common dishes like chili or lasagna. ?Experiment with  different cooking methods. Try roasting or broiling vegetables instead of steaming or saut?eing them. ?Add frozen vegetables to soups, stews, pasta, or rice. ?Add nuts or seeds for added healthy fat at each meal. Y

## 2022-02-18 NOTE — Patient Instructions (Addendum)
Pre visit review using our clinic review tool, if applicable. No additional management support is needed unless otherwise documented below in the visit note. ? ?Increase dose today to take 6 mg and then change weekly dose to take 4 mg daily except take 2 mg on Mondays. Recheck in 1 week.  ?

## 2022-02-18 NOTE — Progress Notes (Signed)
INR result of 1.8 for today was received by [facility] website.  ?Increase dose today to take 6 mg and then change weekly dose to take 4 mg daily except take 2 mg on Mondays. Recheck in 1 week. Pt's daughter does not want AVS mailed. Pt's daughter verbalized understanding.  ?

## 2022-02-21 ENCOUNTER — Other Ambulatory Visit: Payer: Self-pay | Admitting: Family Medicine

## 2022-02-24 ENCOUNTER — Ambulatory Visit: Payer: Medicare Other | Admitting: Obstetrics and Gynecology

## 2022-02-25 ENCOUNTER — Ambulatory Visit (INDEPENDENT_AMBULATORY_CARE_PROVIDER_SITE_OTHER): Payer: Medicare Other

## 2022-02-25 DIAGNOSIS — Z7901 Long term (current) use of anticoagulants: Secondary | ICD-10-CM

## 2022-02-25 LAB — POCT INR: INR: 2.1 (ref 2.0–3.0)

## 2022-02-25 NOTE — Patient Instructions (Addendum)
Pre visit review using our clinic review tool, if applicable. No additional management support is needed unless otherwise documented below in the visit note.  Continue 4 mg daily except take 2 mg on Mondays. Recheck in 1 week.  

## 2022-02-25 NOTE — Progress Notes (Signed)
INR result of 2.1 for today was received by [facility] website.  ?Continue 4 mg daily except take 2 mg on Mondays. Recheck in 1 week. Pt's daughter does not want AVS mailed. Pt's daughter verbalized understanding.  ?

## 2022-03-02 DIAGNOSIS — H2513 Age-related nuclear cataract, bilateral: Secondary | ICD-10-CM | POA: Diagnosis not present

## 2022-03-02 DIAGNOSIS — H04129 Dry eye syndrome of unspecified lacrimal gland: Secondary | ICD-10-CM | POA: Diagnosis not present

## 2022-03-02 DIAGNOSIS — H524 Presbyopia: Secondary | ICD-10-CM | POA: Diagnosis not present

## 2022-03-02 DIAGNOSIS — H5203 Hypermetropia, bilateral: Secondary | ICD-10-CM | POA: Diagnosis not present

## 2022-03-04 ENCOUNTER — Ambulatory Visit (INDEPENDENT_AMBULATORY_CARE_PROVIDER_SITE_OTHER): Payer: Medicare Other

## 2022-03-04 DIAGNOSIS — Z7901 Long term (current) use of anticoagulants: Secondary | ICD-10-CM

## 2022-03-04 LAB — POCT INR: INR: 2.9 (ref 2.0–3.0)

## 2022-03-04 NOTE — Patient Instructions (Addendum)
Pre visit review using our clinic review tool, if applicable. No additional management support is needed unless otherwise documented below in the visit note. ? ?Continue 4 mg daily except take 2 mg on Mondays. Recheck in 1 week. P ?

## 2022-03-04 NOTE — Progress Notes (Signed)
INR result of 2.9 for today was received by [facility] website.  ?Continue 4 mg daily except take 2 mg on Mondays. Recheck in 1 week. Pt's daughter does not want AVS mailed. Pt's daughter verbalized understanding.  ?

## 2022-03-11 ENCOUNTER — Ambulatory Visit (INDEPENDENT_AMBULATORY_CARE_PROVIDER_SITE_OTHER): Payer: Medicare Other

## 2022-03-11 DIAGNOSIS — Z7901 Long term (current) use of anticoagulants: Secondary | ICD-10-CM

## 2022-03-11 LAB — POCT INR: INR: 2.4 (ref 2.0–3.0)

## 2022-03-11 NOTE — Progress Notes (Signed)
INR result of 2.4 for today was received by [facility] website.  Continue 4 mg daily except take 2 mg on Mondays. Recheck in 1 week. Pt's daughter does not want AVS mailed. Pt's daughter verbalized understanding.  

## 2022-03-11 NOTE — Patient Instructions (Addendum)
Pre visit review using our clinic review tool, if applicable. No additional management support is needed unless otherwise documented below in the visit note.  Continue 4 mg daily except take 2 mg on Mondays. Recheck in 1 week.  

## 2022-03-15 ENCOUNTER — Encounter: Payer: Self-pay | Admitting: Obstetrics and Gynecology

## 2022-03-15 ENCOUNTER — Telehealth: Payer: Self-pay | Admitting: Obstetrics and Gynecology

## 2022-03-18 ENCOUNTER — Ambulatory Visit (INDEPENDENT_AMBULATORY_CARE_PROVIDER_SITE_OTHER): Payer: Medicare Other

## 2022-03-18 DIAGNOSIS — Z7901 Long term (current) use of anticoagulants: Secondary | ICD-10-CM | POA: Diagnosis not present

## 2022-03-18 LAB — POCT INR: INR: 2.4 (ref 2.0–3.0)

## 2022-03-18 NOTE — Patient Instructions (Addendum)
Pre visit review using our clinic review tool, if applicable. No additional management support is needed unless otherwise documented below in the visit note.  Continue 4 mg daily except take 2 mg on Mondays. Recheck in 1 week.  

## 2022-03-18 NOTE — Progress Notes (Signed)
INR result of 2.4 for today was received by [facility] website.  Continue 4 mg daily except take 2 mg on Mondays. Recheck in 1 week. Pt's daughter does not want AVS mailed. Pt's daughter verbalized understanding.

## 2022-03-22 DIAGNOSIS — M25511 Pain in right shoulder: Secondary | ICD-10-CM | POA: Diagnosis not present

## 2022-03-22 DIAGNOSIS — M75121 Complete rotator cuff tear or rupture of right shoulder, not specified as traumatic: Secondary | ICD-10-CM | POA: Diagnosis not present

## 2022-04-01 ENCOUNTER — Ambulatory Visit (INDEPENDENT_AMBULATORY_CARE_PROVIDER_SITE_OTHER): Payer: Medicare Other

## 2022-04-01 DIAGNOSIS — Z7901 Long term (current) use of anticoagulants: Secondary | ICD-10-CM

## 2022-04-01 LAB — POCT INR: INR: 2.3 (ref 2.0–3.0)

## 2022-04-01 NOTE — Progress Notes (Signed)
INR result of 2.3 for today was received by [facility] website.  Continue 4 mg daily except take 2 mg on Mondays. Recheck in 1 week. Pt's daughter does not want AVS mailed. Pt's daughter verbalized understanding.

## 2022-04-01 NOTE — Patient Instructions (Addendum)
Pre visit review using our clinic review tool, if applicable. No additional management support is needed unless otherwise documented below in the visit note.  Continue 4 mg daily except take 2 mg on Mondays. Recheck in 1 week.  

## 2022-04-02 ENCOUNTER — Ambulatory Visit: Payer: Medicare Other | Admitting: Obstetrics and Gynecology

## 2022-04-02 ENCOUNTER — Encounter: Payer: Self-pay | Admitting: Obstetrics and Gynecology

## 2022-04-02 VITALS — BP 143/73 | HR 69

## 2022-04-02 DIAGNOSIS — N3281 Overactive bladder: Secondary | ICD-10-CM | POA: Diagnosis not present

## 2022-04-02 NOTE — Progress Notes (Signed)
[facility]  PTNS VISIT  CC:  Overactive bladder  81 y.o. with refractory overactive bladder who presents for percutaneous tibial nerve stimulation. She presents today for a maintenance session. She has recently needed to increase her pads from 2 per day from one per day when she was having weekly treatments. She still has better control overall of her leakage.   Of note, she has completed her course of prophylactic fosfomycin x 6 months.   Procedure: The patient was placed in the sitting position and the right lower extremity was prepped in the usual fashion. The PTNS needle was then inserted at a 60 degree angle, 5 cm cephalad and 2 cm posterior to the medial malleolus. The PTNS unit was then programmed and an optimal response was noted at 12 milliamps. The PTNS stimulation was then performed at this setting for 30 minutes without incident and the patient tolerated the procedure well. The needle was removed.   Return in 2 weeks to assess progress and replace estring.    Marguerita Beards, MD

## 2022-04-08 ENCOUNTER — Telehealth: Payer: Self-pay | Admitting: Family Medicine

## 2022-04-08 ENCOUNTER — Ambulatory Visit (INDEPENDENT_AMBULATORY_CARE_PROVIDER_SITE_OTHER): Payer: Medicare Other

## 2022-04-08 DIAGNOSIS — Z7901 Long term (current) use of anticoagulants: Secondary | ICD-10-CM

## 2022-04-08 LAB — POCT INR: INR: 1.1 — AB (ref 2.0–3.0)

## 2022-04-08 NOTE — Patient Instructions (Addendum)
Pre visit review using our clinic review tool, if applicable. No additional management support is needed unless otherwise documented below in the visit note.  Increase dose today to take 6 mg, increase dose tomorrow to take 6 mg,  and increase dose the day after tomorrow to take 5 mg and then continue 4 mg daily except take 2 mg on Mondays. Recheck in 1 week.

## 2022-04-08 NOTE — Telephone Encounter (Signed)
Noted. Pt and her daughter have been contacted.

## 2022-04-08 NOTE — Progress Notes (Signed)
INR result 1.1 for today was received by [facility] website.   Pt had company all week and had very little greens. Pt's daughter also noticed she forgot to add warfarin to pill box for the last 4 days.  Pt has been quite stable for sometime on the current dosing so no long term changes will be made to dosing.   Increase dose today to take 6 mg, increase dose tomorrow to take 6 mg, and increase dose the day after tomorrow to take 5 mg and then continue 4 mg daily except take 2 mg on Mondays. Recheck in 1 week. Pt's daughter does not want AVS mailed. Pt's daughter verbalized understanding.

## 2022-04-08 NOTE — Telephone Encounter (Signed)
Dalye called from [facility] and said that pts INR is out of scale 1.1 Callback for Dayle if needed is [phone]

## 2022-04-15 ENCOUNTER — Ambulatory Visit (INDEPENDENT_AMBULATORY_CARE_PROVIDER_SITE_OTHER): Payer: Medicare Other

## 2022-04-15 DIAGNOSIS — Z7901 Long term (current) use of anticoagulants: Secondary | ICD-10-CM | POA: Diagnosis not present

## 2022-04-15 LAB — POCT INR: INR: 1.7 — AB (ref 2.0–3.0)

## 2022-04-15 NOTE — Patient Instructions (Addendum)
Pre visit review using our clinic review tool, if applicable. No additional management support is needed unless otherwise documented below in the visit note.  Increase dose today to take 6 mg and the continue 4 mg daily except take 2 mg on Mondays. Recheck in 1 week.

## 2022-04-15 NOTE — Progress Notes (Signed)
INR result 1.7 for today was received by [facility] website.   Increase dose today to take 6 mg and the continue 4 mg daily except take 2 mg on Mondays. Recheck in 1 week. Pt's daughter does not want AVS mailed. Pt's daughter verbalized understanding.

## 2022-04-19 ENCOUNTER — Encounter: Payer: Self-pay | Admitting: Obstetrics and Gynecology

## 2022-04-22 ENCOUNTER — Ambulatory Visit (INDEPENDENT_AMBULATORY_CARE_PROVIDER_SITE_OTHER): Payer: Medicare Other

## 2022-04-22 ENCOUNTER — Ambulatory Visit: Payer: Medicare Other | Admitting: Obstetrics and Gynecology

## 2022-04-22 ENCOUNTER — Encounter: Payer: Self-pay | Admitting: Obstetrics and Gynecology

## 2022-04-22 VITALS — BP 124/84 | HR 67

## 2022-04-22 DIAGNOSIS — N390 Urinary tract infection, site not specified: Secondary | ICD-10-CM

## 2022-04-22 DIAGNOSIS — N952 Postmenopausal atrophic vaginitis: Secondary | ICD-10-CM

## 2022-04-22 DIAGNOSIS — Z7901 Long term (current) use of anticoagulants: Secondary | ICD-10-CM

## 2022-04-22 DIAGNOSIS — N3281 Overactive bladder: Secondary | ICD-10-CM | POA: Diagnosis not present

## 2022-04-22 LAB — POCT INR: INR: 2.2 (ref 2.0–3.0)

## 2022-04-22 NOTE — Patient Instructions (Addendum)
Pre visit review using our clinic review tool, if applicable. No additional management support is needed unless otherwise documented below in the visit note.  Continue 4 mg daily except take 2 mg on Mondays. Recheck in 1 week.

## 2022-04-22 NOTE — Progress Notes (Unsigned)
Order estring

## 2022-04-23 ENCOUNTER — Telehealth: Payer: Self-pay

## 2022-04-23 NOTE — Telephone Encounter (Signed)
Contacted pt's daughter, [name], again to advise to retest on 6/28 instead of scheduled 6/29 due to no coumadin nurse in office. She reported the pt had a fall last night on carpet floor next to her bed. Pt denies any injuries and attributes fall to not turning the light on. Pt denies hitting her head. Advised if anything changes to contact the PCP office.  [name] reported pt will go to a skilled nursing facility (Respite) care from 8/11-8/31 because she will be out of town with her husband's family. She is not sure if they take over medication management but has an apt with the nurse on 7/11 to gather further information. She will f/u after that apt. Advised if anything is needed to contact the office. [name] verbalized understanding.

## 2022-04-25 NOTE — Telephone Encounter (Signed)
Noted. Thanks.

## 2022-04-28 ENCOUNTER — Ambulatory Visit (INDEPENDENT_AMBULATORY_CARE_PROVIDER_SITE_OTHER): Payer: Medicare Other

## 2022-04-28 DIAGNOSIS — Z7901 Long term (current) use of anticoagulants: Secondary | ICD-10-CM | POA: Diagnosis not present

## 2022-04-28 LAB — POCT INR: INR: 1.8 — AB (ref 2.0–3.0)

## 2022-04-28 NOTE — Progress Notes (Signed)
INR result 1.8 for today was received by [facility] website.  Increase dose to take 6 mg today and then continue 4 mg daily except take 2 mg on Mondays. Recheck in 1 week, on 7/6. Pt's daughter does not want AVS mailed. Pt's daughter verbalized understanding.

## 2022-04-28 NOTE — Patient Instructions (Signed)
Pre visit review using our clinic review tool, if applicable. No additional management support is needed unless otherwise documented below in the visit note. 

## 2022-05-03 DIAGNOSIS — H524 Presbyopia: Secondary | ICD-10-CM | POA: Diagnosis not present

## 2022-05-06 ENCOUNTER — Ambulatory Visit (INDEPENDENT_AMBULATORY_CARE_PROVIDER_SITE_OTHER): Payer: Medicare Other

## 2022-05-06 DIAGNOSIS — Z7901 Long term (current) use of anticoagulants: Secondary | ICD-10-CM | POA: Diagnosis not present

## 2022-05-06 LAB — POCT INR: INR: 2 (ref 2.0–3.0)

## 2022-05-06 NOTE — Progress Notes (Signed)
INR result 2.0 for today was received by [facility] website.  Continue 4 mg daily except take 2 mg on Mondays. Recheck in 1 week, on 7/13. Pt's daughter does not want AVS mailed. Pt's daughter and pt verbalized understanding.

## 2022-05-06 NOTE — Patient Instructions (Signed)
Pre visit review using our clinic review tool, if applicable. No additional management support is needed unless otherwise documented below in the visit note.  Continue 4 mg daily except take 2 mg on Mondays. Recheck in 1 week, on 7/13.

## 2022-05-13 ENCOUNTER — Encounter: Payer: Self-pay | Admitting: Family Medicine

## 2022-05-13 ENCOUNTER — Ambulatory Visit (INDEPENDENT_AMBULATORY_CARE_PROVIDER_SITE_OTHER): Payer: Medicare Other

## 2022-05-13 DIAGNOSIS — Z7901 Long term (current) use of anticoagulants: Secondary | ICD-10-CM | POA: Diagnosis not present

## 2022-05-13 LAB — POCT INR
INR: 1.8 — AB (ref 2.0–3.0)
INR: 1.8 — AB (ref 2.0–3.0)

## 2022-05-13 NOTE — Patient Instructions (Addendum)
Pre visit review using our clinic review tool, if applicable. No additional management support is needed unless otherwise documented below in the visit note.  Increase dose today to take 6 mg and then change weekly dose to take 4 mg daily. Recheck in 1 week, on 7/20.

## 2022-05-13 NOTE — Progress Notes (Addendum)
INR result 1.8 for today was received by [facility] website.  Pt has been consistently subtherapeutic or on the lower end of her INR range. Made a dose change to her weekly dosing due to this.  Increase dose today to take 6 mg and then change weekly dose to take 4 mg daily. Recheck in 1 week, on 7/20. Pt's daughter does not want AVS mailed. Pt's daughter and pt verbalized understanding.   [name] reported she will be out of town from 8/10-8/30 and pt will be in respite care for those 3 weeks. She had the visit with the nurse last week but forgot to ask about monitoring the pt's INR. She thinks the company will f/u with PCP concerning her stay there and her medications. Advised if anything is needed to contact the coumadin clinic. [name] verbalized understanding.

## 2022-05-17 ENCOUNTER — Encounter: Payer: Self-pay | Admitting: Family Medicine

## 2022-05-20 ENCOUNTER — Telehealth: Payer: Self-pay | Admitting: Obstetrics and Gynecology

## 2022-05-20 ENCOUNTER — Ambulatory Visit (INDEPENDENT_AMBULATORY_CARE_PROVIDER_SITE_OTHER): Payer: Medicare Other

## 2022-05-20 DIAGNOSIS — Z7901 Long term (current) use of anticoagulants: Secondary | ICD-10-CM

## 2022-05-20 LAB — POCT INR: INR: 1.5 — AB (ref 2.0–3.0)

## 2022-05-20 NOTE — Telephone Encounter (Signed)
Pt daughter called office requesting to inform provider that she noticed blood in the patient's urine this morning. She states there is not a significant amount, just an estimated two drops of blood currently reflecting. Pt is scheduled to attend follow up 05/26/22 and pt daughter states she will call back if she notices a significant change over time. Please review.

## 2022-05-20 NOTE — Progress Notes (Addendum)
Pt's daughter and pt report pt had 2 drops of urine ine the bedside commode this morning. [name] reports it was not clots but it made a small round circle at the bottom of the pan and she is guessing it would have been about 2 drops. Pt does have hx of hemorrhoids but reports no aggravation lately or bleeding at the hemorrhoids. Pt denies any other abnormal bleeding or bruising.  They also contacted urology to make them aware. Pt reports urology recently stopped her low dose abx but no denies any UTI symptoms. Advised UTI symptoms can appear as confusion in elderly and there may not be any other symptoms. Advised if pt became confused to take her immediately to the ER. Pt and [name] verbalized understanding.   Pt's daughter also reports she will be out of town from 8/10-8/30 and pt will be in respite care. They are to request information concerning medication from PCP. [name] is not sure if they are able to test INR. Will f/u with PCP when he receives forms from respite care.   INR result 1.5 for today was received by [facility] website.  Pt has been consistently subtherapeutic or on the lower end of her INR range.Will make another change to weekly dose.  Increase dose today and tomorrow to take 6 mg and then change weekly dose to take 4 mg daily except take 5 mg on Mondays, Wednesdays, and Fridays. Recheck in 1 week, on 7/27. Pt's daughter does not want AVS mailed. Pt's daughter and pt verbalized understanding.  =============== Agree.  Thanks. Crawford Givens

## 2022-05-20 NOTE — Patient Instructions (Addendum)
Pre visit review using our clinic review tool, if applicable. No additional management support is needed unless otherwise documented below in the visit note.  Increase dose today and tomorrow to take 6 mg and then change weekly dose to take 4 mg daily except take 5 mg on Mondays, Wednesdays, and Fridays. Recheck in 1 week, on 7/27.

## 2022-05-21 ENCOUNTER — Encounter: Payer: Self-pay | Admitting: Family Medicine

## 2022-05-21 LAB — POCT INR: INR: 1.5 — AB (ref 2.0–3.0)

## 2022-05-24 ENCOUNTER — Ambulatory Visit
Admission: RE | Admit: 2022-05-24 | Discharge: 2022-05-24 | Disposition: A | Discharge status: Home / Self Care | Payer: Medicare Other | Source: Ambulatory Visit | Attending: Family Medicine | Admitting: Family Medicine

## 2022-05-24 ENCOUNTER — Ambulatory Visit: Payer: Medicare Other

## 2022-05-24 DIAGNOSIS — R928 Other abnormal and inconclusive findings on diagnostic imaging of breast: Secondary | ICD-10-CM | POA: Diagnosis not present

## 2022-05-26 ENCOUNTER — Ambulatory Visit: Payer: Medicare Other | Admitting: Obstetrics and Gynecology

## 2022-05-27 ENCOUNTER — Ambulatory Visit (INDEPENDENT_AMBULATORY_CARE_PROVIDER_SITE_OTHER): Payer: Medicare Other

## 2022-05-27 DIAGNOSIS — Z7901 Long term (current) use of anticoagulants: Secondary | ICD-10-CM

## 2022-05-27 LAB — POCT INR: INR: 2.6 (ref 2.0–3.0)

## 2022-05-27 NOTE — Patient Instructions (Addendum)
Pre visit review using our clinic review tool, if applicable. No additional management support is needed unless otherwise documented below in the visit note.  Continue 4 mg daily except take 5 mg on Mondays, Wednesdays, and Fridays. Recheck in 1 week, on 8/3.

## 2022-05-27 NOTE — Progress Notes (Signed)
INR result 2.6 for today was received by [facility] website.  Continue 4 mg daily except take 5 mg on Mondays, Wednesdays, and Fridays. Recheck in 1 week, on 8/3. Pt's daughter does not want AVS mailed. Pt's daughter and pt verbalized understanding.

## 2022-05-31 MED ORDER — DOCUSATE SODIUM 50 MG PO CAPS: 100.0000 mg | ORAL_CAPSULE | Freq: Every day | ORAL | 0 refills | Status: DC

## 2022-05-31 MED ORDER — LORATADINE 10 MG PO TABS: 10.0000 mg | ORAL_TABLET | Freq: Every day | ORAL | Status: DC

## 2022-05-31 MED ORDER — CYCLOBENZAPRINE HCL 5 MG PO TABS: 5.0000 mg | ORAL_TABLET | Freq: Three times a day (TID) | ORAL | Status: DC | PRN

## 2022-05-31 NOTE — Telephone Encounter (Signed)
See below.  Please send this copy of meds for respite care.  Thanks.    Pot chlor Er (one per day) L-thyroxine 50 mcg (one per day) Warfarin 2mg  (two per day) Atorvastatin 10mg  (one per day) Furosemide 20mg  (one per morning) Doxylamine Succinate 25mg  (one per evening) Magnesium 500 mg (one per evening) Loratadine 10mg  (one per morning) Docusate sodium 50mg  (2 per evening) ONLY AS NEEDED: Polyethylene glycol powder 17 g daily Metoprolol succinate 25mg  tab- 1/2 tab daily if needed for pulse >100.   OTC Lidocaine patches on back, shoulder and neck for pain.   Acetaminophen 2 x 500 mg  every 6 hours for pain.   Cyclobenzaprine 5mg  3 per day muscle spasm

## 2022-06-01 ENCOUNTER — Telehealth: Payer: Self-pay | Admitting: Family Medicine

## 2022-06-01 DIAGNOSIS — Z111 Encounter for screening for respiratory tuberculosis: Secondary | ICD-10-CM

## 2022-06-01 NOTE — Telephone Encounter (Signed)
Spoke with patients daughter [name] and let her know I have not received any forms from [facility] as of yet. She is going to call and see what they needed or was sending over.

## 2022-06-01 NOTE — Telephone Encounter (Signed)
Patient daughter called for medication form for Dr. Para March to fill out for the [facility] facility that has sent over through fax or e-mail. Call back number [phone].

## 2022-06-01 NOTE — Telephone Encounter (Signed)
Forms have been received and placed in Dr. Lianne Bushy inbox

## 2022-06-02 ENCOUNTER — Encounter: Payer: Self-pay | Admitting: Obstetrics and Gynecology

## 2022-06-02 ENCOUNTER — Ambulatory Visit: Payer: Medicare Other | Admitting: Obstetrics and Gynecology

## 2022-06-02 DIAGNOSIS — N3281 Overactive bladder: Secondary | ICD-10-CM

## 2022-06-02 NOTE — Progress Notes (Signed)
[facility]  PTNS VISIT  CC:  Overactive bladder  81 y.o. with refractory overactive bladder who presents for percutaneous tibial nerve stimulation. The patient presents for PTNS maintenance session   Procedure: The patient was placed in the sitting position and the right lower extremity was prepped in the usual fashion. The PTNS needle was then inserted at a 60 degree angle, 5 cm cephalad and 2 cm posterior to the medial malleolus. The PTNS unit was then programmed and an optimal response was noted at 7 milliamps. The PTNS stimulation was then performed at this setting for 30 minutes without incident and the patient tolerated the procedure well. The needle was removed and hemostasis was noted.   The pt will return in 1 month for maintenance session and will also replace estring at that time.   Marguerita Beards, MD

## 2022-06-02 NOTE — Telephone Encounter (Signed)
Noted.  Thanks.  I will work on them.

## 2022-06-03 ENCOUNTER — Ambulatory Visit (INDEPENDENT_AMBULATORY_CARE_PROVIDER_SITE_OTHER): Payer: Medicare Other

## 2022-06-03 DIAGNOSIS — Z7901 Long term (current) use of anticoagulants: Secondary | ICD-10-CM

## 2022-06-03 LAB — POCT INR: INR: 3.7 — AB (ref 2.0–3.0)

## 2022-06-03 NOTE — Patient Instructions (Addendum)
Pre visit review using our clinic review tool, if applicable. No additional management support is needed unless otherwise documented below in the visit note.  Hold dose today and then change weekly dose to take 2 tablets (4 mg) daily. Recheck on 8/10.

## 2022-06-03 NOTE — Telephone Encounter (Signed)
completed

## 2022-06-03 NOTE — Progress Notes (Addendum)
INR result 3.7 for today was received by [facility] website.  Hold dose today and then change weekly dose to take 2 tablets (4 mg) daily.  Daughter will check INR before she leaves out of town on 8/10. Pt will be in respite care from 8/10 to 8/30 while her daughter is out of town. LVM to coordinate INR checks with Shanda Bumps, Charity fundraiser at [place] at [place] at [phone]. Pt's daughter does not want AVS mailed. Pt's daughter and pt verbalized understanding.

## 2022-06-04 NOTE — Telephone Encounter (Signed)
Tried to Patent attorney at [employer] and Designer, television/film set reports the nurses name is Victorino Dike, not Hunker. She took a Industrial/product designer and will give it to the nurse. Also tried to send and email given to [facility] by Production designer, theatre/television/film at facility but it came back as undeliverable.    LVM to coordinate INR checks with Shanda Bumps, Charity fundraiser at [place] at [place] at [phone].

## 2022-06-07 NOTE — Telephone Encounter (Signed)
Contacted [facility] and had to leave another msg with the operator. She reports Victorino Dike is not in today but she will give the msg to her Diplomatic Services operational officer, Asher Muir.   Contacted [name] and advised. She reports she is supposed to get a call from the coordinator there today and she will let them know of needing INR checks.

## 2022-06-07 NOTE — Telephone Encounter (Signed)
Received call from Christian, Director at [place] at [phone], who reports pt will need a TB test, chest x-ray stating it is to rule out TB or a quantiferon TB gold completed by the start of her stay on 8/10. He reports they can read the TB test at the facility if needed but would prefer a chest x-ray to rule out TB or a TB gold. Advised a msg would be sent to PCP for authorization and this nurse will f/u with him. He will also contact the nurse tomorrow to inquire about INR testing.

## 2022-06-08 ENCOUNTER — Other Ambulatory Visit: Payer: Self-pay

## 2022-06-08 ENCOUNTER — Ambulatory Visit (INDEPENDENT_AMBULATORY_CARE_PROVIDER_SITE_OTHER)
Admission: RE | Admit: 2022-06-08 | Discharge: 2022-06-08 | Disposition: A | Discharge status: Home / Self Care | Payer: Medicare Other | Source: Ambulatory Visit | Attending: Family Medicine | Admitting: Family Medicine

## 2022-06-08 DIAGNOSIS — Z111 Encounter for screening for respiratory tuberculosis: Secondary | ICD-10-CM | POA: Diagnosis not present

## 2022-06-08 DIAGNOSIS — M47814 Spondylosis without myelopathy or radiculopathy, thoracic region: Secondary | ICD-10-CM | POA: Diagnosis not present

## 2022-06-08 DIAGNOSIS — Z981 Arthrodesis status: Secondary | ICD-10-CM | POA: Diagnosis not present

## 2022-06-08 DIAGNOSIS — K449 Diaphragmatic hernia without obstruction or gangrene: Secondary | ICD-10-CM | POA: Diagnosis not present

## 2022-06-08 MED ORDER — ESTRING 2 MG VA RING: 2.0000 mg | VAGINAL_RING | VAGINAL | Status: DC

## 2022-06-08 NOTE — Addendum Note (Signed)
Addended by: Eustaquio Boyden on: 06/08/2022 01:00 PM   Modules accepted: Orders

## 2022-06-08 NOTE — Telephone Encounter (Signed)
I've ordered CXR for pulmonary TB screen. Thanks.

## 2022-06-08 NOTE — Addendum Note (Signed)
Addended by: Eustaquio Boyden on: 06/08/2022 01:41 PM   Modules accepted: Orders

## 2022-06-08 NOTE — Progress Notes (Addendum)
Estring ordered on [employer] for pt f/u appt on 07/23/22.

## 2022-06-08 NOTE — Telephone Encounter (Addendum)
Pt is on her way and will be there before 3:45 for CXR to rule out TB. Will need to fax results to [phone] Attention [place].

## 2022-06-08 NOTE — Telephone Encounter (Signed)
Noted. Thanks.  I appreciate all involved.  I've never had a FL2 denied for attaching a med list.

## 2022-06-08 NOTE — Telephone Encounter (Signed)
I've ordered quantiferon TB test - can we do that instead of CXR?

## 2022-06-08 NOTE — Telephone Encounter (Signed)
Victorino Dike, RN, reports she talked to her manager and it is ok with them for her to use pt's INR machine while she is there to test her once weekly. She will perform first test on 8/17 and call coumadin clinic with result. She would like any dosing instructions to be faxed to [phone].  She also reported she received the signed FL2 form from PCP with a medication list attached and saying "see attached". She reports this is not allowed so she wrote all of the medications on a new FL2 form and faxed it to the office. Advised PCP is out of the office this week. She requested another provider sign the form. Advised this nurse would let the clinic know.   Contacted [name] and advised to bring the INR machine with the pt to the facility and they will test with the machine. Advised still awaiting word from provider concerning CXR to rule out TB. [name] verbalized understanding and was appreciative of the call.

## 2022-06-08 NOTE — Telephone Encounter (Signed)
Received VM from Victorino Dike, nurse at [facility] who advised to contact her on her cell number [phone], to coordinate INR testing while pt is there.  LVM  Received VM from [facility] checking on which testing will be completed for the TB rule-out.

## 2022-06-09 ENCOUNTER — Other Ambulatory Visit: Payer: Self-pay | Admitting: Family Medicine

## 2022-06-09 DIAGNOSIS — R911 Solitary pulmonary nodule: Secondary | ICD-10-CM

## 2022-06-09 DIAGNOSIS — Z111 Encounter for screening for respiratory tuberculosis: Secondary | ICD-10-CM

## 2022-06-09 HISTORY — DX: Solitary pulmonary nodule: R91.1

## 2022-06-09 NOTE — Telephone Encounter (Signed)
Victorino Dike, RN from [facility], called to inquire if the fax she sent was received. Advised they are looking for it and will have another provider sign it and fax with CXR results. She faxed it again incase it is lost.  Fax has been obtained by office.

## 2022-06-09 NOTE — Telephone Encounter (Signed)
Forms have been signed, chest xray have been addressed and printed. Everything has been signed and faxed back to [facility].

## 2022-06-10 ENCOUNTER — Ambulatory Visit (INDEPENDENT_AMBULATORY_CARE_PROVIDER_SITE_OTHER): Payer: Medicare Other

## 2022-06-10 DIAGNOSIS — Z7901 Long term (current) use of anticoagulants: Secondary | ICD-10-CM | POA: Diagnosis not present

## 2022-06-10 LAB — POCT INR: INR: 1.8 — AB (ref 2.0–3.0)

## 2022-06-10 NOTE — Progress Notes (Addendum)
INR result 1.8 for today was received by [facility] website.   Increase dose today to take 3 tablets (6 mg) and then continue 2 tablets (4 mg) daily and recheck on 8/16. Contacted pt and [name] to advise to increase dose today but if the medication is not changed tonight, to the 3 tablets for just tonight, then to increase her dose tomorrow to 3 tablets and then continue 2 tablets daily. Pt has phone number for coumadin clinic. Pt does not have a direct line so will need to call facility and ask for her if needing to talk to pt. .   Pt will be in respite care from 8/10 to 8/30 while her daughter is out of town. Communication through Victorino Dike, Charity fundraiser at [facility] [place] at [phone]   Jamal Maes to contact [facility] several times and unable to leave a VM so a msg was left with front office rep, Miracle, that dose changes are needed. She reports Victorino Dike has left for the day but will return tomorrow. Advised instructions will also be faxed. Miracle verbalized understanding and advised the msg would be left for [facility].  Faxed dosing change and dosing calendar.  Victorino Dike returned call and received instructions for change in dosing today to 6 mg. She will change dosing from faxed order so the pt can take 6 mg today and then return to 2 mg daily and recheck on 8//16.

## 2022-06-10 NOTE — Patient Instructions (Addendum)
Pre visit review using our clinic review tool, if applicable. No additional management support is needed unless otherwise documented below in the visit note.  Increase dose today to take 3 tablets (6 mg) and then continue 2 tablets (4 mg) daily and recheck on 8/16.

## 2022-06-16 ENCOUNTER — Ambulatory Visit (INDEPENDENT_AMBULATORY_CARE_PROVIDER_SITE_OTHER): Payer: Medicare Other

## 2022-06-16 DIAGNOSIS — Z7901 Long term (current) use of anticoagulants: Secondary | ICD-10-CM

## 2022-06-16 LAB — POCT INR: INR: 3.2 — AB (ref 2.0–3.0)

## 2022-06-16 NOTE — Progress Notes (Signed)
INR result 3.2 for today was received by Victorino Dike, RN, at [facility] respite care. Cell number for Victorino Dike is [phone]. Contacted Victorino Dike and advised of dosing and to recheck in 1 week. Victorino Dike verbalized understanding. Faxed updated dosing.  Decrease dose today to take 2 mg and then return to normal dosing to take 4 mg daily and recheck in 1 week.   Pt will be in respite care from 8/10 to 8/30 while her daughter is out of town. Victorino Dike, RN at [facility] [place] at [phone]. Faxed instructions to [facility] for dose change, to [phone].

## 2022-06-16 NOTE — Patient Instructions (Addendum)
Pre visit review using our clinic review tool, if applicable. No additional management support is needed unless otherwise documented below in the visit note.  Decrease dose today to take 2 mg and then return to normal dosing to take 4 mg daily and recheck in 1 week.

## 2022-06-24 ENCOUNTER — Ambulatory Visit (INDEPENDENT_AMBULATORY_CARE_PROVIDER_SITE_OTHER): Payer: Medicare Other

## 2022-06-24 ENCOUNTER — Telehealth: Payer: Self-pay

## 2022-06-24 ENCOUNTER — Other Ambulatory Visit: Payer: Self-pay | Admitting: Family Medicine

## 2022-06-24 DIAGNOSIS — Z7901 Long term (current) use of anticoagulants: Secondary | ICD-10-CM | POA: Diagnosis not present

## 2022-06-24 LAB — POCT INR: INR: 2.9 (ref 2.0–3.0)

## 2022-06-24 NOTE — Patient Instructions (Addendum)
Pre visit review using our clinic review tool, if applicable. No additional management support is needed unless otherwise documented below in the visit note.  Continue 4 mg daily and recheck in 1 week.  Note for [name]; Please provide testing on the morning of 8/31 due to coumadin clinic nurse being out in the afternoon of 8/31 and also 9/1. This message is for [facility] for testing next week.

## 2022-06-24 NOTE — Telephone Encounter (Signed)
Mallory Estrada reporting INR of 2.9 for today. She also reported she did not receive the orders for POCT INR via fax. Fax number is [phone]. Faxed orders and to continue current dosing.

## 2022-06-24 NOTE — Progress Notes (Addendum)
INR result 2.9 for today was received by Victorino Dike, RN, at [facility] respite care. Cell number for Victorino Dike is [phone]. Victorino Dike requested order for POCT INR and any further instructions be faxed to [phone]. Faxed order and instructions.  Continue 4 mg daily and recheck in 1 week.  Note for [name]; Please provide testing on the morning of 8/31 due to coumadin clinic nurse being out in the afternoon of 8/31 and also 9/1. This message is for [facility] for testing next week.

## 2022-06-24 NOTE — Telephone Encounter (Signed)
[name], pt's daughter, called wanting to know if [facility] has taken pt's INR for this week. Advised they have not called yet with result and it was not on the [facility] website. She requested this nurse contact [facility]. Advised the nurse at the facility will be contacted. [name] reports she will be back on 8/30 and will be testing for the pt next week. [name] verbalized understanding.

## 2022-06-28 NOTE — Progress Notes (Signed)
Estring received from [employer] [facility] on 06/25/2022

## 2022-07-01 ENCOUNTER — Ambulatory Visit (INDEPENDENT_AMBULATORY_CARE_PROVIDER_SITE_OTHER): Payer: Medicare Other

## 2022-07-01 DIAGNOSIS — Z7901 Long term (current) use of anticoagulants: Secondary | ICD-10-CM | POA: Diagnosis not present

## 2022-07-01 LAB — PROTIME-INR: INR: 2.5 — AB (ref 0.80–1.20)

## 2022-07-01 LAB — POCT INR: INR: 2.5 (ref 2.0–3.0)

## 2022-07-01 NOTE — Progress Notes (Signed)
INR result 2.5 for today was received from [facility] website used by pt and her daughter for home testing INR. [name] reports she only gave pt 1 tablet last night. She is not sure this is correct and thinks there may have been some miscommunication with the respite care facility pt has been at a few weeks. Pt is currently home now.  Continue 4 mg daily and recheck in 1 week.

## 2022-07-01 NOTE — Patient Instructions (Addendum)
Pre visit review using our clinic review tool, if applicable. No additional management support is needed unless otherwise documented below in the visit note.  Continue 4 mg daily and recheck in 1 week.  

## 2022-07-04 ENCOUNTER — Encounter: Payer: Self-pay | Admitting: Family Medicine

## 2022-07-08 ENCOUNTER — Ambulatory Visit (INDEPENDENT_AMBULATORY_CARE_PROVIDER_SITE_OTHER): Payer: Medicare Other

## 2022-07-08 DIAGNOSIS — Z7901 Long term (current) use of anticoagulants: Secondary | ICD-10-CM | POA: Diagnosis not present

## 2022-07-08 LAB — POCT INR: INR: 2.5 (ref 2.0–3.0)

## 2022-07-08 NOTE — Progress Notes (Signed)
INR result 2.5 for today was received from [facility] website used by pt and her daughter for home testing INR. Continue 4 mg daily and recheck in 1 week.

## 2022-07-08 NOTE — Patient Instructions (Addendum)
Pre visit review using our clinic review tool, if applicable. No additional management support is needed unless otherwise documented below in the visit note.  Continue 4 mg daily and recheck in 1 week.  

## 2022-07-15 ENCOUNTER — Ambulatory Visit (INDEPENDENT_AMBULATORY_CARE_PROVIDER_SITE_OTHER): Payer: Medicare Other

## 2022-07-15 DIAGNOSIS — Z7901 Long term (current) use of anticoagulants: Secondary | ICD-10-CM

## 2022-07-15 LAB — POCT INR: INR: 3.1 — AB (ref 2.0–3.0)

## 2022-07-15 NOTE — Patient Instructions (Signed)
Pre visit review using our clinic review tool, if applicable. No additional management support is needed unless otherwise documented below in the visit note.  Decrease dose today to take only 2 mg and then continue 4 mg daily and recheck in 1 week.

## 2022-07-15 NOTE — Progress Notes (Signed)
INR result 3.1 for today was received from [facility] website used by pt and her daughter for home testing INR. Decrease dose today to take only 2 mg and then continue 4 mg daily and recheck in 1 week.  LVM for pt's daughter per her request if she could not answer call.

## 2022-07-22 LAB — POCT INR: INR: 2.4 (ref 2.0–3.0)

## 2022-07-23 ENCOUNTER — Encounter: Payer: Self-pay | Admitting: Obstetrics and Gynecology

## 2022-07-23 ENCOUNTER — Ambulatory Visit: Payer: Medicare Other | Admitting: Obstetrics and Gynecology

## 2022-07-23 ENCOUNTER — Ambulatory Visit (INDEPENDENT_AMBULATORY_CARE_PROVIDER_SITE_OTHER): Payer: Medicare Other

## 2022-07-23 VITALS — BP 121/78 | HR 55

## 2022-07-23 DIAGNOSIS — N390 Urinary tract infection, site not specified: Secondary | ICD-10-CM

## 2022-07-23 DIAGNOSIS — N3281 Overactive bladder: Secondary | ICD-10-CM | POA: Diagnosis not present

## 2022-07-23 DIAGNOSIS — Z7901 Long term (current) use of anticoagulants: Secondary | ICD-10-CM | POA: Diagnosis not present

## 2022-07-23 DIAGNOSIS — M25512 Pain in left shoulder: Secondary | ICD-10-CM | POA: Diagnosis not present

## 2022-07-23 DIAGNOSIS — M25511 Pain in right shoulder: Secondary | ICD-10-CM | POA: Diagnosis not present

## 2022-07-23 NOTE — Progress Notes (Signed)
[facility] Return Visit  SUBJECTIVE  History of Present Illness: Mallory Estrada is a 81 y.o. female seen in follow-up for estring replacement and PTNS session for OAB.   She feels that she has been having good control overall with her incontinence. Has not had any UTIs.    Past Medical History: Patient  has a past medical history of Arthritis, Atrial fibrillation ([place]), Depression, Gait abnormality, Hyperlipidemia, Hypothyroidism, Ophthalmoplegic migraine, and SUI (stress urinary incontinence, female).   Past Surgical History: She  has a past surgical history that includes Breast biopsy; Tonsillectomy and adenoidectomy; Back surgery; Hip surgery (Bilateral); and Knee surgery (Bilateral).   Medications: She has a current medication list which includes the following prescription(s): acetaminophen, atorvastatin, vitamin d3, cyclobenzaprine, docusate sodium, doxylamine (sleep), estring, fosfomycin, furosemide, klor-con m20, levothyroxine, loratadine, magnesium gluconate, metoprolol succinate, polyethylene glycol powder, and warfarin.   Allergies: Patient is allergic to fentanyl, gabapentin, ketamine, macrobid [nitrofurantoin], memantine, and sulfa antibiotics.   Social History: Patient  reports that she quit smoking about 51 years ago. Her smoking use included cigarettes. She has never used smokeless tobacco. She reports current alcohol use. She reports that she does not use drugs.      OBJECTIVE     Physical Exam: Vitals:   07/23/22 1409  BP: 121/78  Pulse: (!) 55    Gen: No apparent distress, A&O x 3.  Normal external genitalia. Estring grasped and removed. New estring replaced.    PTNS  Procedure: The patient was placed in the sitting position and the left lower extremity was prepped in the usual fashion. The PTNS needle was then inserted at a 60 degree angle, 5 cm cephalad and 2 cm posterior to the medial malleolus. The PTNS unit was then programmed and an  optimal response was noted at 11 milliamps. The PTNS stimulation was then performed at this setting for 30 minutes without incident and the patient tolerated the procedure well. The needle was removed and hemostasis was noted.     ASSESSMENT AND PLAN    Ms. Hilgeman is a 81 y.o. with:  1. Overactive bladder   2. Recurrent UTI     - will replace estring q 3 months.  - PTNS session done today. Will plan for q6 week maintenance.   Jaquita Folds, MD  Time spent: I spent 15 minutes dedicated to the care of this patient on the date of this encounter to include pre-visit review of records, face-to-face time with the patient and post visit documentation. Additional time was spent on the procedure.

## 2022-07-23 NOTE — Progress Notes (Signed)
INR result 2.4 for today was received from [facility] website used by pt and her daughter for home testing INR. Continue 4 mg daily and recheck in 1 week.  Contacted pt's daughter, Berline Chough, to advise continue current dosing and recheck in 1 week. [name] verbalized understanding.

## 2022-07-23 NOTE — Patient Instructions (Addendum)
Pre visit review using our clinic review tool, if applicable. No additional management support is needed unless otherwise documented below in the visit note.  Continue 4 mg daily and recheck in 1 week.

## 2022-07-27 ENCOUNTER — Ambulatory Visit (INDEPENDENT_AMBULATORY_CARE_PROVIDER_SITE_OTHER): Payer: Medicare Other

## 2022-07-27 DIAGNOSIS — Z23 Encounter for immunization: Secondary | ICD-10-CM | POA: Diagnosis not present

## 2022-07-28 ENCOUNTER — Telehealth: Payer: Self-pay

## 2022-07-28 MED ORDER — WARFARIN SODIUM 2 MG PO TABS: 4.0000 mg | ORAL_TABLET | Freq: Every day | ORAL | 1 refills | Status: DC

## 2022-07-28 NOTE — Telephone Encounter (Signed)
Pt's daughter called and stated that [facility] has shipped her mother's warfarin, but is unsure when it may arrive.  She requests a two week supply be sent to [facility] on [address].  Prescription sent.

## 2022-07-29 ENCOUNTER — Ambulatory Visit (INDEPENDENT_AMBULATORY_CARE_PROVIDER_SITE_OTHER): Payer: Medicare Other

## 2022-07-29 DIAGNOSIS — Z7901 Long term (current) use of anticoagulants: Secondary | ICD-10-CM | POA: Diagnosis not present

## 2022-07-29 LAB — POCT INR: INR: 3.2 — AB (ref 2.0–3.0)

## 2022-07-29 NOTE — Progress Notes (Signed)
INR result today is 3.2. Result received from [facility] website used by pt and her daughter for home testing INR. Pt's daughter also lvm with result.  Decrease dose today to 1 tablet (2 mg total) then continue 4 mg daily and recheck in 1 week.   Contacted pt's daughter, Berline Chough, to advise continue current dosing and recheck in 1 week. [name] verbalized understanding.

## 2022-07-29 NOTE — Patient Instructions (Signed)
Decrease dose today to 1 tablet (2 mg total) then continue 4 mg daily and recheck in 1 week.

## 2022-07-30 ENCOUNTER — Encounter: Payer: Self-pay | Admitting: Family Medicine

## 2022-08-03 ENCOUNTER — Other Ambulatory Visit: Payer: Self-pay | Admitting: Family Medicine

## 2022-08-04 ENCOUNTER — Telehealth: Payer: Self-pay | Admitting: Family Medicine

## 2022-08-04 DIAGNOSIS — R269 Unspecified abnormalities of gait and mobility: Secondary | ICD-10-CM

## 2022-08-04 NOTE — Telephone Encounter (Signed)
Please see her mychart message below.  I thank you for your effort and help.  Does this patient need a separate referral?  I would appreciate your input.  =================== Hello, Last spring we were referred to a neurologist for balance and gait problems.  We were accidentally scheduled with a Dr Shawn Route who apologized that there had been some sort of mix up because she specializes in memory loss. We were thrilled with the happy accident because mom was concerned about her memory and we went on our way. But now we are remembering that she never actually saw a neurologist for balance and gait, could you make another referral for her please?    Thank you!

## 2022-08-05 ENCOUNTER — Ambulatory Visit (INDEPENDENT_AMBULATORY_CARE_PROVIDER_SITE_OTHER): Payer: Medicare Other

## 2022-08-05 DIAGNOSIS — Z7901 Long term (current) use of anticoagulants: Secondary | ICD-10-CM | POA: Diagnosis not present

## 2022-08-05 LAB — POCT INR: INR: 3.3 — AB (ref 2.0–3.0)

## 2022-08-05 NOTE — Patient Instructions (Signed)
Decrease dose today to 1 tablet (2 mg total).  Then change weekly dose to 2 tablets (4 mg total) daily EXCEPT take 1 tablet (2 mg total) on Mondays. Recheck in 1 week

## 2022-08-05 NOTE — Addendum Note (Signed)
Addended by: Tonia Ghent on: 08/05/2022 07:59 AM   Modules accepted: Orders

## 2022-08-05 NOTE — Telephone Encounter (Signed)
Rondel Jumbo, PA-C  Tonia Ghent, MD Caller: Unspecified (Yesterday,  1:25 PM) Good morning Dr. Damita Dunnings:  She is established patient but this is a new problem, so will need a "new patient" 60 min visit. A referral will be needed by you. I hope it helps Have a great day!  ===================== I put in a new referral.  Thanks.

## 2022-08-05 NOTE — Progress Notes (Signed)
INR result today is 3.3. Result received from [facility] website used by pt and her daughter Berline Chough) for home testing INR. Pt and pt's daughter both on the phone and called result to anticoagulation clinic.   Decrease dose today to 1 tablet (2 mg total)   Then change weekly dose to 2 tablets (4 mg total) daily EXCEPT take 1 tablet (2 mg total) on Mondays. Recheck in 1 week. Both verbalized understanding.

## 2022-08-10 DIAGNOSIS — H2513 Age-related nuclear cataract, bilateral: Secondary | ICD-10-CM | POA: Diagnosis not present

## 2022-08-10 DIAGNOSIS — H02834 Dermatochalasis of left upper eyelid: Secondary | ICD-10-CM | POA: Diagnosis not present

## 2022-08-10 DIAGNOSIS — D239 Other benign neoplasm of skin, unspecified: Secondary | ICD-10-CM | POA: Diagnosis not present

## 2022-08-10 DIAGNOSIS — H02831 Dermatochalasis of right upper eyelid: Secondary | ICD-10-CM | POA: Diagnosis not present

## 2022-08-12 ENCOUNTER — Ambulatory Visit (INDEPENDENT_AMBULATORY_CARE_PROVIDER_SITE_OTHER): Payer: Medicare Other

## 2022-08-12 DIAGNOSIS — Z7901 Long term (current) use of anticoagulants: Secondary | ICD-10-CM | POA: Diagnosis not present

## 2022-08-12 LAB — POCT INR: INR: 3.2 — AB (ref 2.0–3.0)

## 2022-08-12 NOTE — Patient Instructions (Signed)
Hold today's dose. Then change weekly dose to 2 tablets (4 mg total) daily EXCEPT take 1 tablet (2 mg total) on Mondays. Recheck in 1 week. Both verbalized understanding.

## 2022-08-12 NOTE — Progress Notes (Signed)
INR result today is 3.2. Result received from [facility] website used by pt and her daughter Berline Chough) for home testing INR. Pt and pt's daughter both on the phone and called result to anticoagulation clinic.   Hold today's dose. Then change weekly dose to 2 tablets (4 mg total) daily EXCEPT take 1 tablet (2 mg total) on Mondays. Recheck in 1 week. Both verbalized understanding.

## 2022-08-19 ENCOUNTER — Ambulatory Visit (INDEPENDENT_AMBULATORY_CARE_PROVIDER_SITE_OTHER): Payer: Medicare Other

## 2022-08-19 DIAGNOSIS — Z7901 Long term (current) use of anticoagulants: Secondary | ICD-10-CM

## 2022-08-19 LAB — POCT INR: INR: 2.3 (ref 2.0–3.0)

## 2022-08-19 NOTE — Progress Notes (Signed)
INR result today is 2.3. Result received from [facility] website used by pt and her daughter Berline Chough) for home testing INR.  Continue to take 2 tablets (4 mg total) daily EXCEPT take 1 tablet (2 mg total) on Mondays. Recheck in 1 week.

## 2022-08-19 NOTE — Patient Instructions (Signed)
Continue to take 2 tablets (4 mg total) daily EXCEPT take 1 tablet (2 mg total) on Mondays. Recheck in 1 week.  

## 2022-08-26 ENCOUNTER — Ambulatory Visit (INDEPENDENT_AMBULATORY_CARE_PROVIDER_SITE_OTHER): Payer: Medicare Other

## 2022-08-26 DIAGNOSIS — Z7901 Long term (current) use of anticoagulants: Secondary | ICD-10-CM | POA: Diagnosis not present

## 2022-08-26 LAB — POCT INR: INR: 2.8 (ref 2.0–3.0)

## 2022-08-26 NOTE — Progress Notes (Signed)
INR result today is 2.8. Result received from [facility] website used by pt and her daughter Berline Chough) for home testing INR.  Continue to take 2 tablets (4 mg total) daily EXCEPT take 1 tablet (2 mg total) on Mondays. Recheck in 1 week.

## 2022-08-26 NOTE — Patient Instructions (Signed)
Continue to take 2 tablets (4 mg total) daily EXCEPT take 1 tablet (2 mg total) on Mondays. Recheck in 1 week.  

## 2022-09-02 ENCOUNTER — Ambulatory Visit (INDEPENDENT_AMBULATORY_CARE_PROVIDER_SITE_OTHER): Payer: Medicare Other

## 2022-09-02 DIAGNOSIS — Z7901 Long term (current) use of anticoagulants: Secondary | ICD-10-CM | POA: Diagnosis not present

## 2022-09-02 LAB — POCT INR: INR: 2 (ref 2.0–3.0)

## 2022-09-02 NOTE — Progress Notes (Signed)
INR result today is 2.0. Result received from [facility] website used by pt and her daughter Berline Chough) for home testing INR.  Continue to take 2 tablets (4 mg total) daily EXCEPT take 1 tablet (2 mg total) on Mondays. Recheck in 1 week.

## 2022-09-02 NOTE — Patient Instructions (Signed)
Continue to take 2 tablets (4 mg total) daily EXCEPT take 1 tablet (2 mg total) on Mondays. Recheck in 1 week.

## 2022-09-03 ENCOUNTER — Ambulatory Visit: Payer: Medicare Other | Admitting: Obstetrics and Gynecology

## 2022-09-09 ENCOUNTER — Ambulatory Visit (INDEPENDENT_AMBULATORY_CARE_PROVIDER_SITE_OTHER): Payer: Medicare Other

## 2022-09-09 DIAGNOSIS — Z7901 Long term (current) use of anticoagulants: Secondary | ICD-10-CM | POA: Diagnosis not present

## 2022-09-09 LAB — POCT INR: INR: 2.1 (ref 2.0–3.0)

## 2022-09-09 NOTE — Patient Instructions (Signed)
Continue to take 2 tablets (4 mg total) daily EXCEPT take 1 tablet (2 mg total) on Mondays. Recheck in 1 week.

## 2022-09-09 NOTE — Progress Notes (Signed)
INR result today is 2.0. Result received from [facility] website used by pt and her daughter [name]) for home testing INR.  Continue to take 2 tablets (4 mg total) daily EXCEPT take 1 tablet (2 mg total) on Mondays. Recheck in 1 week.

## 2022-09-15 ENCOUNTER — Encounter: Payer: Self-pay | Admitting: Psychology

## 2022-09-15 ENCOUNTER — Ambulatory Visit: Payer: Medicare Other | Admitting: Psychology

## 2022-09-15 DIAGNOSIS — F03A0 Unspecified dementia, mild, without behavioral disturbance, psychotic disturbance, mood disturbance, and anxiety: Secondary | ICD-10-CM

## 2022-09-15 DIAGNOSIS — R4189 Other symptoms and signs involving cognitive functions and awareness: Secondary | ICD-10-CM

## 2022-09-15 DIAGNOSIS — N393 Stress incontinence (female) (male): Secondary | ICD-10-CM | POA: Insufficient documentation

## 2022-09-15 HISTORY — DX: Unspecified dementia, mild, without behavioral disturbance, psychotic disturbance, mood disturbance, and anxiety: F03.A0

## 2022-09-15 NOTE — Progress Notes (Signed)
   Psychometrician Note   Cognitive testing was administered to Mallory Estrada by Wallace Keller, B.S. (psychometrist) under the supervision of Dr. Newman Nickels, Ph.D., licensed psychologist on 09/15/2022. [name] did not appear overtly distressed by the testing session per behavioral observation or responses across self-report questionnaires. Rest breaks were offered.    The battery of tests administered was selected by Dr. Newman Nickels, Ph.D. with consideration to [name]'s current level of functioning, the nature of her symptoms, emotional and behavioral responses during interview, level of literacy, observed level of motivation/effort, and the nature of the referral question. This battery was communicated to the psychometrist. Communication between Dr. Newman Nickels, Ph.D. and the psychometrist was ongoing throughout the evaluation and Dr. Newman Nickels, Ph.D. was immediately accessible at all times. Dr. Newman Nickels, Ph.D. provided supervision to the psychometrist on the date of this service to the extent necessary to assure the quality of all services provided.    Mallory Estrada will return within approximately 1-2 weeks for an interactive feedback session with Dr. Milbert Coulter at which time her test performances, clinical impressions, and treatment recommendations will be reviewed in detail. [name] understands she can contact our office should she require our assistance before this time.  A total of 100 minutes of billable time were spent face-to-face with [name] by the psychometrist. This includes both test administration and scoring time. Billing for these services is reflected in the clinical report generated by Dr. Newman Nickels, Ph.D.  This note reflects time spent with the psychometrician and does not include test scores or any clinical interpretations made by Dr. Milbert Coulter. The full report will follow in a separate note.

## 2022-09-15 NOTE — Progress Notes (Unsigned)
NEUROPSYCHOLOGICAL EVALUATION [place]. [facility] Department of Neurology  Date of Evaluation: September 15, 2022  Reason for Referral:   Mallory Estrada is a 81 y.o. right-handed Caucasian female referred by Mallory Kays, PA-C, to characterize her current cognitive functioning and assist with diagnostic clarity and treatment planning in the context of subjective cognitive decline and concerns surrounding an underlying neurodegenerative illness.   Assessment and Plan:   Clinical Impression(s): Performance across an embedded performance validity measure was within expectation. However, there were some engagement concerns as Mallory Estrada expressed increasing fatigue and a desire to discontinue the evaluation within the first 10 minutes of testing procedures. She did respond well to encouragement provided by the psychometrist for the most part. However, the evaluation did eventually have to be abbreviated in response. She was also resistant to completing any task with a motorized component, likely due to discomfort involving the right side of her body.   With this caveat in mind, Mallory Estrada pattern of performance is suggestive of ongoing impairments surrounding processing speed, attention/concentration, verbal fluency, and visuospatial (line orientation) abilities. Receptive language and executive functioning were unable to be assessed. Performance variability was exhibited across verbal learning and memory while confrontation was age-appropriate. Regarding day-to-day functioning, Mallory Estrada daughter has fully taken over medication, financial, and bill paying responsibilities. Mallory Estrada also no longer drives due to safety concerns. Given evidence for cognitive and functional impairment, she best meets diagnostic criteria for a Major Neurocognitive Disorder ("dementia") at the present time.  Regarding etiology, previous medical records outline a diagnosis of "dementia due  to Alzheimer's disease." I do not agree with this diagnosis based upon current testing. While Mallory Estrada had trouble learning and later recalling a lengthy list of words, she explicitly stated "I stopped listening" during learning trials, suggestive of limited engagement in this task from the outset. Despite this, she was a perfect 20/20 across a list recognition task, which is not suggestive of a memory storage impairment. Across a story-based task, she learned this information well, kept this information well after a delay (90% retention), and also scored a perfect 12/12 across its associated recognition task. Taken together, this does not suggest rapid forgetting or a profound memory storage impairment. While I cannot fully rule out this condition, current memory scores are not compelling for a diagnosis of Alzheimer's disease at the present time.   The overall etiology is admittedly unclear at the present time. There is the possibility of an illness along the parkinsonian spectrum, namely progressive supranuclear palsy (PSP) or Lewy body dementia (LBD). Regarding PSP, Mallory Estrada described notable balance instability, frequent falling, and instances where her eyes will inadvertently jump, making it difficult to read and remain on an intended line. I have further concerns surrounding oculomotor dysfunction; however, this was admittedly difficult to accurately assess as Mallory Estrada had trouble keeping her head still and not rotating it towards the direction she was instructed to look. She also exhibited a diminished rate of speech, which can be seen in this illness. Testing patterns align with what can be seen in this illness, albeit not all cognitive domains were able to be adequately assessed. It should be highlighted that PSP is a rare neurological illness and that while her recent brain MRI revealed mild to moderate atrophy, this was said to be generalized rather than focusing on the midbrain.  Regarding  LBD, Mallory Estrada described visual hallucinations and other possible delusional thinking (e.g., that other people are in the  room with her or in bed with her). This, when combined with balance instability, other parkinsonian features, and reporting of some active movement while asleep (e.g., instances where she will kick the covers off herself throughout the night) would raise concerns. However, it should be highlighted that reported visual hallucinations were described as hypnopompic rather than occurring while fully awake. Unfortunately, executive functioning and visuospatial abilities, which are the hallmark areas of cognitive weakness in this illness, were unable to be sufficiently assessed. While I cannot rule in these conditions with certainty, they should at least remain on her differential.   Recommendations: Mallory Estrada and her medical team may wish to consider a referral for a DaTscan. While there remains the possibility that balance instability is related to prior spine surgeries, these could also be caused by an underlying parkinsonian condition. This scan would be helpful in increasing or decreasing concerns surrounding these differential diagnoses.   I agree with decisions made to have Mallory Estrada fully abstain from all driving behaviors based upon current testing.   Mallory Estrada is encouraged to attend to lifestyle factors for brain health (e.g., regular physical exercise, good nutrition habits, regular participation in cognitively-stimulating activities, and general stress management techniques), which are likely to have benefits for both emotional adjustment and cognition. Optimal control of vascular risk factors (including safe cardiovascular exercise and adherence to dietary recommendations) is encouraged. Continued participation in activities which provide mental stimulation and social interaction is also recommended.   When learning new information, she would benefit from information  being broken up into small, manageable pieces. She may also find it helpful to articulate the material in her own words and in a context to promote encoding at the onset of a new task. This material may need to be repeated multiple times to promote encoding.  Because she shows better recall for structured information, she will likely understand and retain new information better if it is presented to her in a meaningful or well-organized manner at the outset, such as grouping items into meaningful categories or presenting information in an outlined, bulleted, or story format.   To address problems with processing speed, she may wish to consider:   -Ensuring that she is alerted when essential material or instructions are being presented   -Adjusting the speed at which new information is presented   -Allowing for more time in comprehending, processing, and responding in conversation  To address problems with fluctuating attention, she may wish to consider:   -Avoiding external distractions when needing to concentrate   -Limiting exposure to fast paced environments with multiple sensory demands   -Writing down complicated information and using checklists   -Attempting and completing one task at a time (i.e., no multi-tasking)   -Verbalizing aloud each step of a task to maintain focus   -Taking frequent breaks during the completion of steps/tasks to avoid fatigue   -Reducing the amount of information considered at one time  Review of Records:   Ms. Westergren was seen by [facility] Mallory Kays, PA-C) on 01/13/2022 for an evaluation of memory loss. At that time, memory decline was said to be present for the past 2-3 years. Examples included becoming disoriented when walking into a room and repeating herself frequently. There was also report of word finding difficulties and a diminished rate of speech. Gait abnormalities were reported, attributed to degenerative disease in her back, having  undergone several surgeries, including an L4-L5 compression fracture fusion, as well as a T12 L1-L2 fusion in the  past. She has very vivid dreams which are longstanding ("throughout her life, she acts out in her dreams, imagining things, such as other people in her bed usually in strangers"). Actual awake hallucinations were denied. Her daughter manages more instrumental ADLs. Performance on a brief cognitive screening instrument (MOCA) was 15/30. Ultimately, Ms. Deatra Cantermerson was referred for a comprehensive neuropsychological evaluation to characterize her cognitive abilities and to assist with diagnostic clarity and treatment planning.   Her daughter reported that Ms. Deatra Cantermerson had undergone a previous evaluation in 2018/2019 at [facility] in [place], [place]. While she reported making many attempts to have these records sent, I was unfortunately unable to locate this report for comparison purposes. They were unsure of the official results of this evaluation during interview.   Head CT on 10/29/2021 in the context of a worsening headache was negative. Brain MRI on 02/08/2022 mild to moderate generalized atrophy and minimal microvascular ischemic disease.   Past Medical History:  Diagnosis Date   Arthritis of finger of both hands 06/17/2021   Atrial fibrillation    Noted postop.   Gait abnormality    H/O laminectomy 01/14/2022   Hip arthritis    Hyperlipidemia    Hypothyroidism    Insomnia 02/18/2022   Left-sided chest wall pain 10/13/2021   Mild dementia, unclear etiology 09/15/2022   Nonintractable headache 11/02/2021   Ophthalmoplegic migraine    Pulmonary nodule 06/09/2022   CXR 06/2022    Recurrent UTI 03/26/2021   Refusal of blood transfusions as patient is Jehovah's Witness 12/29/2020   Shoulder pain 09/02/2021   SUI (stress urinary incontinence, female)    Toe pain 09/02/2021    Past Surgical History:  Procedure Laterality Date   BACK SURGERY     BREAST BIOPSY     HIP SURGERY  Bilateral    KNEE SURGERY Bilateral    TONSILLECTOMY AND ADENOIDECTOMY      Current Outpatient Medications:    acetaminophen (TYLENOL) 500 MG tablet, Take 500 mg by mouth every 6 (six) hours as needed., Disp: , Rfl:    atorvastatin (LIPITOR) 10 MG tablet, TAKE 1 TABLET DAILY, Disp: 90 tablet, Rfl: 3   Cholecalciferol (VITAMIN D3) 125 MCG (5000 UT) CAPS, Take by mouth., Disp: , Rfl:    cyclobenzaprine (FLEXERIL) 5 MG tablet, Take 1 tablet (5 mg total) by mouth 3 (three) times daily as needed for muscle spasms., Disp: , Rfl:    docusate sodium (COLACE) 50 MG capsule, Take 2 capsules (100 mg total) by mouth at bedtime., Disp: 10 capsule, Rfl: 0   doxylamine, Sleep, (UNISOM) 25 MG tablet, Take 1 tablet (25 mg total) by mouth at bedtime as needed., Disp: , Rfl:    estradiol (ESTRING) 7.5 MCG/24HR vaginal ring, Place 2 mg vaginally every 3 (three) months. follow package directions, Disp: 1 each, Rfl:    fosfomycin (MONUROL) 3 g PACK, 1 dose every 10 days., Disp: 1 each, Rfl:    furosemide (LASIX) 20 MG tablet, 1 tab a day.  If ankle swelling, take 2 tabs on MWF if needed., Disp: 135 tablet, Rfl: 3   KLOR-CON M20 20 MEQ tablet, TAKE 1 TABLET DAILY, Disp: 90 tablet, Rfl: 3   levothyroxine (SYNTHROID) 50 MCG tablet, TAKE 1 TABLET 30 MINUTES BEFORE BREAKFAST. NEW PRESCRIPTION REQUEST, Disp: 90 tablet, Rfl: 3   loratadine (CLARITIN) 10 MG tablet, Take 1 tablet (10 mg total) by mouth daily., Disp: , Rfl:    magnesium gluconate (MAGONATE) 500 MG tablet, Take 500 mg by mouth  at bedtime., Disp: , Rfl:    metoprolol succinate (TOPROL XL) 25 MG 24 hr tablet, Take 0.5 tablets (12.5 mg total) by mouth as needed (ELEVATED HEART RATE)., Disp: 30 tablet, Rfl: 11   polyethylene glycol powder (GLYCOLAX/MIRALAX) 17 GM/SCOOP powder, Take 17 g by mouth daily as needed (for constipation)., Disp: , Rfl:    warfarin (COUMADIN) 2 MG tablet, TAKE 2 TABLETS DAILY BY MOUTH, EXCEPT TAKE 1 TABLET ON MONDAYS, WEDNESDAYS AND FRIDAYS  OR AS DIRECTED BY COUMADIN CLINIC, Disp: 180 tablet, Rfl: 3   warfarin (COUMADIN) 2 MG tablet, Take 2 tablets (4 mg total) by mouth daily. TAKE 2 TABLETS (4 MG TOTAL) BY MOUTH DAILY OR AS DIRECTED BY ANTICOAGULATION CLINIC, Disp: 30 tablet, Rfl: 1  Clinical Interview:   The following information was obtained during a clinical interview with Ms. Stofko and her daughter prior to cognitive testing.  Cognitive Symptoms: Decreased short-term memory: Endorsed. Examples included quickly forgetting details of past conversations, forgetting names of less familiar individuals, misplacing or losing things in her environment, and repeating herself and/or asking repetitive questions. Her daughter was in agreement. Deficits have been present since around 2018 and do seem to have progressively worsened over time.  Decreased long-term memory: Denied. Decreased attention/concentration: Endorsed. They described trouble with sustained attention and increased distractibility.  Reduced processing speed: Endorsed. Difficulties with executive functions: Endorsed. They reported trouble with multi-tasking and organization. They denied trouble with impulsivity or any significant personality changes.  Difficulties with emotion regulation: Denied. Difficulties with receptive language: Denied. Difficulties with word finding: Endorsed. She reported having to "hunt" for her words often and that this represented her first salient concerns years ago. She and her daughter also reported a decreased rate of speech.  Decreased visuoperceptual ability: Denied.  Difficulties completing ADLs: Endorsed. Her daughter manages medications, finances, and bill paying responsibilities. Ms. Ledonne no longer drives largely due to safety concerns.   Additional Medical History: History of traumatic brain injury/concussion: Unclear. Ms. Lafferty has fallen many times over the years and sustained several head impacts as a result. She denied any  where she experienced a loss in consciousness or any known persisting cognitive deficits.  History of stroke: Denied. History of seizure activity: Denied. History of known exposure to toxins: Denied. Symptoms of chronic pain: Endorsed. She reported longstanding back pain, as well as more acute right shoulder pain.  Experience of frequent headaches/migraines: Denied. Approximately a year ago she reported experiencing a tingling/painful experience on the back of her head/neck which persisted for many days. This was relieved with lidocaine patches. No current experiences were reported.  Frequent instances of dizziness/vertigo: Denied.  Sensory changes: She wears glasses with some benefit. However, she still reported some trouble reading due to her eyes jumping inadvertently and her having trouble staying on her intended line. Balance/coordination difficulties: Endorsed. She commonly ambulates with a walker. Despite there, there remains significant balance instability and she has fallen many times over the years. She did not report falling in any particular direction more frequently. Her daughter reportedly wakes with her throughout the night due to balance instability and overall fall risk.  Other motor difficulties: Endorsed. She reported a history of a postural/action tremor affecting her upper extremities bilaterally.   Sleep History: Estimated hours obtained each night: 6-7 hours.  Difficulties falling asleep: Endorsed. She described her mind being too "busy" some nights which will prevent her from falling asleep quickly.  Difficulties staying asleep: Endorsed. She and her daughter reported her waking 2-3 times  per night, generally to use the restroom.  Feels rested and refreshed upon awakening: Denied. She commented that she feels like she will "carry sleep into her waking hours" after awakening each morning.   History of snoring: Denied. History of waking up gasping for air: Denied. Witnessed  breath cessation while asleep: Denied.  History of vivid dreaming: Endorsed. This was said to be longstanding in nature.  Excessive movement while asleep: Endorsed. They described instances where she will kick the covers off of her while asleep. Her daughter described leg movements as her mother "bicycling" in her sleep at times. This was said to occur infrequently, perhaps one night every other week.  Instances of acting out her dreams: Denied outside what is described above.   Psychiatric/Behavioral Health History: Depression: She described her current mood as "all right" and she commented that she is often laughing and enjoying herself. She denied to her knowledge any prior mental health concerns or diagnoses. Current or remote suicidal ideation, intent, or plan was denied.  Anxiety: Denied. Mania: Denied. Trauma History: Denied. Visual/auditory hallucinations: Endorsed. In the past, she reported hypnopompic hallucinations immediately surrounding waking involving cartoon-ish people or animals. She noted that these were not distressing and she would simply step towards them and they would vanish in a puff of smoke. No hallucinations were reported during fully waking hours. Hypnopompic experiences were said to be infrequent currently.  Delusional thoughts: Denied. However, her daughter did note that her mother will wake and make comments surrounding another individual being in her room or in her bed and can seem quite disoriented. None of these experiences were reported during fully waking hours.   Tobacco: Denied. Alcohol: She reported very rare alcohol consumption and denied a history of problematic alcohol abuse or dependence.  Recreational drugs: Denied.  Family History: Problem Relation Age of Onset   Arthritis Mother    Arthritis Father    Diabetes Father    Heart disease Father    Arthritis Sister    Heart disease Brother    Colon cancer Brother    Heart attack Brother    Breast  cancer Maternal Aunt    This information was confirmed by Ms. Nishiyama.  Academic/Vocational History: Highest level of educational attainment: 16 years. 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 She reported a longstanding weakness with numbers and formulas, especially in mathematical settings.  History of developmental delay: Denied. History of grade repetition: Denied. Enrollment in special education courses: Denied. History of LD/ADHD: Denied.  Employment: Retired. She previously worked as a Runner, broadcasting/film/video.   Evaluation Results:   Behavioral Observations: Ms. Beiser was accompanied by her daughter, arrived to her appointment on time, and was appropriately dressed and groomed. She appeared alert. She was pushed in a wheelchair by her daughter. As such, gait and station were unable to be assessed. A very subtle postural tremor was observed when asked to demonstrate her reporting of symptoms. Her affect was generally relaxed and positive. Spontaneous speech was fluent but slowed in overall rate. Mild word finding difficulties were observed during interview. Thought processes were coherent, organized, and normal in content. Insight into her cognitive difficulties appeared adequate.   Attempts to assess oculomotor dysfunction were challenging as she had trouble not moving her head when asked to look in a particular direction. There did appear to be some trouble trouble looking in all directions. During testing, sustained attention was appropriate. Task engagement was variable and she  expressed increasing fatigue and a desire to discontinue the evaluation within the first 10 minutes of testing procedures. She did respond well to encouragement provided by the psychometrist. However, testing did eventually need to be abbreviated. She refused to attempt tasks which had a motorized component. Overall, Ms. Brash was cooperative with the  clinical interview and generally so with subsequent testing procedures.   Adequacy of Effort: The validity of neuropsychological testing is limited by the extent to which the individual being tested may be assumed to have exerted adequate effort during testing. Ms. Renovato expressed her intention to perform to the best of her abilities. Performance across an embedded performance validity measure was within expectation. However, there were some engagement concerns as Ms. Monjaraz expressed increasing fatigue and a desire to discontinue the evaluation within the first 10 minutes of testing procedures. She did respond well to encouragement provided by the psychometrist. The results of the current evaluation are believed to be a largely valid representation of Ms. Stowers's current cognitive functioning.  Test Results: Ms. Tabor was poorly oriented at the time of the current evaluation. She incorrectly stated her age ("87") and was unable to recall her address or phone number. She also was unable to state the current date or name of the current clinic.   Intellectual abilities based upon educational and vocational attainment were estimated to be in the average range. Premorbid abilities were estimated to be within the above average range based upon a single-word reading test.   Processing speed was exceptionally low across a simple task of rapid counting from 1-25. Other processing speed tasks were unable to be completed. Basic attention was well below average. More complex attention (e.g., working memory) was unable to be assessed. Executive functioning was unable to be assessed.  Receptive language abilities were unable to be directly assessed. However, Ms. Sergent did not exhibit any difficulties comprehending task instructions and answered all questions asked of her appropriately, suggesting the likelihood of some intact abilities. Assessed expressive language was variable. Phonemic fluency was well  below average, semantic fluency was exceptionally low to well below average, and confrontation naming was below average to above average.    She performed in the well below average range across a line orientation task. Other visuospatial/visuoconstructional abilities were unable to be assessed.    Learning (i.e., encoding) of novel verbal information was exceptionally low across a list learning task but average across a story-based task. Spontaneous delayed recall (i.e., retrieval) of previously learned information was commensurate with performances across learning trials. Retention rates were 90% across a story learning task and 0% across a list learning task. Performance across recognition tasks was quite strong, suggesting evidence for information consolidation.   Results of emotional screening instruments suggested that recent symptoms of generalized anxiety were in the minimal range, while symptoms of depression were within the mild range. A screening instrument assessing recent sleep quality suggested the presence of minimal sleep dysfunction.  Tables of Scores:   Note: This summary of test scores accompanies the interpretive report and should not be considered in isolation without reference to the appropriate sections in the text. Descriptors are based on appropriate normative data and may be adjusted based on clinical judgment. Terms such as "Within Normal Limits" and "Outside Normal Limits" are used when a more specific description of the test score cannot be determined.       Percentile - Normative Descriptor > 98 - Exceptionally High 91-97 - Well Above Average 75-90 - Above Average 25-74 -  Average 9-24 - Below Average 2-8 - Well Below Average < 2 - Exceptionally Low       Validity:   DESCRIPTOR       RBANS Effort Index: --- --- Within Normal Limits       Orientation:      Raw Score Percentile   NAB Orientation, Form 1 18/29 --- ---       Cognitive Screening:      Raw Score  Percentile   SLUMS: 16/26  (Pt refused  clock drawing) --- ---       RBANS, Form A: Standard Score/ Scaled Score Percentile   Total Score --- --- ---  Immediate Memory 83 13 Below Average    List Learning 3 1 Exceptionally Low    Story Memory 11 63 Average  Visuospatial/Constructional --- --- ---    Figure Copy DC'D (Pt refused) --- ---    Line Orientation 10/20 3-9 Well Below Average  Language 86 18 Below Average    Picture Naming 10/10 >75 Above Average    Semantic Fluency 4 2 Well Below Average  Attention --- --- ---    Digit Span 5 5 Well Below Average    Coding DC'D (Pt refused) --- ---  Delayed Memory --- --- ---    List Recall 0/10 <2 Exceptionally Low    List Recognition 20/20 51-75 Average    Story Recall 11 63 Average    Story Recognition 12/12 87+ Above Average    Figure Recall DC'D (Pt refused) --- ---    Figure Recognition DC'D (Pt refused) --- ---        Intellectual Functioning:      Standard Score Percentile   Test of Premorbid Functioning: 118 88 Above Average       Attention/Executive Function:     Oral Trail Making Test (OTMT): Raw Score (Z-Score) Percentile     Part A 19 secs.,  0 errors (-5.2) <1 Exceptionally Low    Part B 115 secs.,  2 errors (-4.25) <1 Exceptionally Low       Language:     Verbal Fluency Test: Raw Score (Scaled Score) Percentile     Phonemic Fluency (CFL) 11 (5) 5 Well Below Average    Category Fluency 14 (3) 1 Exceptionally Low  *Based on [facility] Older Normative Studies (MOANS)          NAB Language Module, Form 1: T Score Percentile     Naming 27/31 (39) 14 Below Average       Visuospatial/Visuoconstruction:      Raw Score Percentile   Clock Drawing: DC'D (Pt refused) --- ---       Mood and Personality:      Raw Score Percentile   PROMIS Depression Questionnaire: 18 --- Mild  PROMIS Anxiety Questionnaire: 15 --- None to Slight       Additional Questionnaires:      Raw Score Percentile   PROMIS Sleep Disturbance  Questionnaire: 14 --- None to Slight   Informed Consent and Coding/Compliance:   The current evaluation represents a clinical evaluation for the purposes previously outlined by the referral source and is in no way reflective of a forensic evaluation.   Ms. Causey was provided with a verbal description of the nature and purpose of the present neuropsychological evaluation. Also reviewed were the foreseeable risks and/or discomforts and benefits of the procedure, limits of confidentiality, and mandatory reporting requirements of this provider. The patient was given the opportunity to ask questions and receive answers  about the evaluation. Oral consent to participate was provided by the patient.   This evaluation was conducted by Newman Nickels, Ph.D., ABPP-CN, board certified clinical neuropsychologist. Ms. Amaker completed a clinical interview with Dr. Milbert Coulter, billed as one unit [phone], and 100 minutes of cognitive testing and scoring, billed as one unit [phone] and two additional units 96139. Psychometrist Wallace Keller, B.S., assisted Dr. Milbert Coulter with test administration and scoring procedures. As a separate and discrete service, Dr. Milbert Coulter spent a total of 160 minutes in interpretation and report writing billed as one unit [phone] and two units 96133.

## 2022-09-16 ENCOUNTER — Ambulatory Visit (INDEPENDENT_AMBULATORY_CARE_PROVIDER_SITE_OTHER): Payer: Medicare Other

## 2022-09-16 ENCOUNTER — Encounter: Payer: Self-pay | Admitting: Psychology

## 2022-09-16 DIAGNOSIS — Z7901 Long term (current) use of anticoagulants: Secondary | ICD-10-CM

## 2022-09-16 LAB — POCT INR: INR: 2.5 (ref 2.0–3.0)

## 2022-09-16 NOTE — Patient Instructions (Signed)
Continue to take 2 tablets (4 mg total) daily EXCEPT take 1 tablet (2 mg total) on Mondays. Recheck in 1 week.  

## 2022-09-16 NOTE — Progress Notes (Signed)
INR result today is 2.5. Result received from [facility] website used by pt and her daughter [name]) for home testing INR.  Continue to take 2 tablets (4 mg total) daily EXCEPT take 1 tablet (2 mg total) on Mondays. Recheck in 1 week.

## 2022-09-22 ENCOUNTER — Telehealth: Payer: Self-pay

## 2022-09-22 NOTE — Telephone Encounter (Signed)
Pt was scheduled for a home INR check this week. Called and LVM for pt 's daughter, [name], informing her that the anticoagulation clinic will be closed from 11/23 - 11/27. Any home INR results that are submitted during that time will not be read until 11/28.

## 2022-09-27 ENCOUNTER — Ambulatory Visit (INDEPENDENT_AMBULATORY_CARE_PROVIDER_SITE_OTHER): Payer: Medicare Other | Admitting: Psychology

## 2022-09-27 DIAGNOSIS — F03A0 Unspecified dementia, mild, without behavioral disturbance, psychotic disturbance, mood disturbance, and anxiety: Secondary | ICD-10-CM

## 2022-09-27 NOTE — Progress Notes (Signed)
   Neuropsychology Feedback Session Eligha Bridegroom. [facility] Department of Neurology  Reason for Referral:   Mallory Estrada is a 81 y.o. right-handed Caucasian female referred by [name], PA-C, to characterize her current cognitive functioning and assist with diagnostic clarity and treatment planning in the context of subjective cognitive decline and concerns surrounding an underlying neurodegenerative illness.   Feedback:   [name] completed a comprehensive neuropsychological evaluation on 09/15/2022. Please refer to that encounter for the full report and recommendations. Briefly, results suggested ongoing impairments surrounding processing speed, attention/concentration, verbal fluency, and visuospatial (line orientation) abilities. Receptive language and executive functioning were unable to be assessed. Performance variability was exhibited across verbal learning and memory while confrontation was age-appropriate. The overall etiology is admittedly unclear at the present time. I do not agree with a prior diagnosis of Alzheimer's disease based upon current testing. There is the possibility of an illness along the parkinsonian spectrum, namely progressive supranuclear palsy (PSP) or Lewy body dementia (LBD). While I cannot rule in these conditions with certainty, they should at least remain on her differential.   [name] was accompanied by her daughter during the current telephone call. They were within her residence while I was within my office. I discussed the limitations of evaluation and management by telemedicine and the availability of in person appointments. [name] expressed her understanding and agreed to proceed. Content of the current session focused on the results of her neuropsychological evaluation. [name] was given the opportunity to ask questions and her questions were answered. She was encouraged to reach out should additional questions arise. A  copy of her report was mailed at the conclusion of the visit.      30 minutes were spent conducting the current feedback session with [name], billed as one unit [phone].

## 2022-09-30 ENCOUNTER — Telehealth: Payer: Self-pay

## 2022-09-30 LAB — POCT INR: INR: 3.6 — AB (ref 2.0–3.0)

## 2022-09-30 NOTE — Telephone Encounter (Signed)
Spoke with patients daughter [name] and advised on dose change. She verbalized understanding.

## 2022-09-30 NOTE — Telephone Encounter (Signed)
I would decrease her total weekly dose slightly.  Would continue to take 2 tablets (4 mg total) daily EXCEPT take 1 tablet (2 mg total) today and Monday. Recheck in 1 week.    Total 24mg  per week for this week.    Routed to anticoagulation clinic as FYI.  Thanks.

## 2022-09-30 NOTE — Telephone Encounter (Signed)
Mallory Estrada (DPR signed) calling INR 3.6. pt taking warfarin 2 mg tab taking 2 tabs every evening except Mon and pt takes 1 tab on Mon. [name] RN is out of office and sending note to Dr Para March and Para March pool. Mallory Estrada request cb after Dr Para March reviews note.

## 2022-10-01 ENCOUNTER — Ambulatory Visit: Payer: Self-pay

## 2022-10-01 NOTE — Progress Notes (Signed)
Encounter created for documentation.  Home INR result today is 3.6. Pt's daughter, [name], called results to main office since [facility] RN is out of office.    Per Dr. Lianne Bushy instructions, pt is to continue 2 tablets (4 mg total) daily EXCEPT take 1 tablet (2 mg total) today (11/30) and Monday (12/4) and recheck in 1 week. Instructions called to [facility] by CMA.

## 2022-10-01 NOTE — Patient Instructions (Signed)
Continue to take 2 tablets (4 mg total) daily EXCEPT take 1 tablet (2 mg total) today (11/30) and Monday (12/4) and recheck in 1 week.

## 2022-10-04 ENCOUNTER — Ambulatory Visit: Payer: Medicare Other | Admitting: Physician Assistant

## 2022-10-04 ENCOUNTER — Encounter: Payer: Self-pay | Admitting: Physician Assistant

## 2022-10-04 VITALS — BP 120/78 | HR 78 | Resp 18

## 2022-10-04 DIAGNOSIS — F03A0 Unspecified dementia, mild, without behavioral disturbance, psychotic disturbance, mood disturbance, and anxiety: Secondary | ICD-10-CM

## 2022-10-04 DIAGNOSIS — G20A1 Parkinson's disease without dyskinesia, without mention of fluctuations: Secondary | ICD-10-CM

## 2022-10-04 NOTE — Patient Instructions (Signed)
It was a pleasure to see you today at our office.   Recommendations:  DaT scan  Speech therapy  6 month follow up for memory  Pending on the DaT scan will determine if there is to be a visit to movement disorder clinic   RECOMMENDATIONS FOR ALL PATIENTS WITH MEMORY PROBLEMS: 1. Continue to exercise (Recommend 30 minutes of walking everyday, or 3 hours every week) 2. Increase social interactions - continue going to [place] and enjoy social gatherings with friends and family 3. Eat healthy, avoid fried foods and eat more fruits and vegetables 4. Maintain adequate blood pressure, blood sugar, and blood cholesterol level. Reducing the risk of stroke and cardiovascular disease also helps promoting better memory. 5. Avoid stressful situations. Live a simple life and avoid aggravations. Organize your time and prepare for the next day in anticipation. 6. Sleep well, avoid any interruptions of sleep and avoid any distractions in the bedroom that may interfere with adequate sleep quality 7. Avoid sugar, avoid sweets as there is a strong link between excessive sugar intake, diabetes, and cognitive impairment We discussed the Mediterranean diet, which has been shown to help patients reduce the risk of progressive memory disorders and reduces cardiovascular risk. This includes eating fish, eat fruits and green leafy vegetables, nuts like almonds and hazelnuts, walnuts, and also use olive oil. Avoid fast foods and fried foods as much as possible. Avoid sweets and sugar as sugar use has been linked to worsening of memory function.  There is always a concern of gradual progression of memory problems. If this is the case, then we may need to adjust level of care according to patient needs. Support, both to the patient and caregiver, should then be put into place.     FALL PRECAUTIONS: Be cautious when walking. Scan the area for obstacles that may increase the risk of trips and falls. When getting up in the  mornings, sit up at the edge of the bed for a few minutes before getting out of bed. Consider elevating the bed at the head end to avoid drop of blood pressure when getting up. Walk always in a well-lit room (use night lights in the walls). Avoid area rugs or power cords from appliances in the middle of the walkways. Use a walker or a cane if necessary and consider physical therapy for balance exercise. Get your eyesight checked regularly.  FINANCIAL OVERSIGHT: Supervision, especially oversight when making financial decisions or transactions is also recommended.  HOME SAFETY: Consider the safety of the kitchen when operating appliances like stoves, microwave oven, and blender. Consider having supervision and share cooking responsibilities until no longer able to participate in those. Accidents with firearms and other hazards in the house should be identified and addressed as well.   ABILITY TO BE LEFT ALONE: If patient is unable to contact 911 operator, consider using LifeLine, or when the need is there, arrange for someone to stay with patients. Smoking is a fire hazard, consider supervision or cessation. Risk of wandering should be assessed by caregiver and if detected at any point, supervision and safe proof recommendations should be instituted.  MEDICATION SUPERVISION: Inability to self-administer medication needs to be constantly addressed. Implement a mechanism to ensure safe administration of the medications.   DRIVING: Regarding driving, in patients with progressive memory problems, driving will be impaired. We advise to have someone else do the driving if trouble finding directions or if minor accidents are reported. Independent driving assessment is available to determine safety of  driving.   If you are interested in the driving assessment, you can contact the following:  [employer] in [place] [phone]  [facility] [phone]  [facility] [phone] or [phone]    Mediterranean Diet A Mediterranean diet refers to food and lifestyle choices that are based on the traditions of countries located on the [employer]. This way of eating has been shown to help prevent certain conditions and improve outcomes for people who have chronic diseases, like kidney disease and heart disease. What are tips for following this plan? Lifestyle  Cook and eat meals together with your family, when possible. Drink enough fluid to keep your urine clear or pale yellow. Be physically active every day. This includes: Aerobic exercise like running or swimming. Leisure activities like gardening, walking, or housework. Get 7-8 hours of sleep each night. If recommended by your health care provider, drink red wine in moderation. This means 1 glass a day for nonpregnant women and 2 glasses a day for men. A glass of wine equals 5 oz (150 mL). Reading food labels  Check the serving size of packaged foods. For foods such as rice and pasta, the serving size refers to the amount of cooked product, not dry. Check the total fat in packaged foods. Avoid foods that have saturated fat or trans fats. Check the ingredients list for added sugars, such as corn syrup. Shopping  At the grocery store, buy most of your food from the areas near the walls of the store. This includes: Fresh fruits and vegetables (produce). Grains, beans, nuts, and seeds. Some of these may be available in unpackaged forms or large amounts (in bulk). Fresh seafood. Poultry and eggs. Low-fat dairy products. Buy whole ingredients instead of prepackaged foods. Buy fresh fruits and vegetables in-season from local farmers markets. Buy frozen fruits and vegetables in resealable bags. If you do not have access to quality fresh seafood, buy precooked frozen shrimp or canned fish, such as tuna, salmon, or sardines. Buy small amounts of raw or cooked  vegetables, salads, or olives from the deli or salad bar at your store. Stock your pantry so you always have certain foods on hand, such as olive oil, canned tuna, canned tomatoes, rice, pasta, and beans. Cooking  Cook foods with extra-virgin olive oil instead of using butter or other vegetable oils. Have meat as a side dish, and have vegetables or grains as your main dish. This means having meat in small portions or adding small amounts of meat to foods like pasta or stew. Use beans or vegetables instead of meat in common dishes like chili or lasagna. Experiment with different cooking methods. Try roasting or broiling vegetables instead of steaming or sauteing them. Add frozen vegetables to soups, stews, pasta, or rice. Add nuts or seeds for added healthy fat at each meal. You can add these to yogurt, salads, or vegetable dishes. Marinate fish or vegetables using olive oil, lemon juice, garlic, and fresh herbs. Meal planning  Plan to eat 1 vegetarian meal one day each week. Try to work up to 2 vegetarian meals, if possible. Eat seafood 2 or more times a week. Have healthy snacks readily available, such as: Vegetable sticks with hummus. Greek yogurt. Fruit and nut trail mix. Eat balanced meals throughout the week. This includes: Fruit: 2-3 servings a day Vegetables: 4-5 servings a day Low-fat dairy: 2 servings a day Fish, poultry, or lean meat: 1 serving a day Beans and legumes:  2 or more servings a week Nuts and seeds: 1-2 servings a day Whole grains: 6-8 servings a day Extra-virgin olive oil: 3-4 servings a day Limit red meat and sweets to only a few servings a month What are my food choices? Mediterranean diet Recommended Grains: Whole-grain pasta. Brown rice. Bulgar wheat. Polenta. Couscous. Whole-wheat bread. Orpah Cobb. Vegetables: Artichokes. Beets. Broccoli. Cabbage. Carrots. Eggplant. Green beans. Chard. Kale. Spinach. Onions. Leeks. Peas. Squash. Tomatoes. Peppers.  Radishes. Fruits: Apples. Apricots. Avocado. Berries. Bananas. Cherries. Dates. Figs. Grapes. Lemons. Melon. Oranges. Peaches. Plums. Pomegranate. Meats and other protein foods: Beans. Almonds. Sunflower seeds. Pine nuts. Peanuts. Cod. Salmon. Scallops. Shrimp. Tuna. Tilapia. Clams. Oysters. Eggs. Dairy: Low-fat milk. Cheese. Greek yogurt. Beverages: Water. Red wine. Herbal tea. Fats and oils: Extra virgin olive oil. Avocado oil. Grape seed oil. Sweets and desserts: [place] yogurt with honey. Baked apples. Poached pears. Trail mix. Seasoning and other foods: Basil. Cilantro. Coriander. Cumin. Mint. Parsley. Sage. Rosemary. Tarragon. Garlic. Oregano. Thyme. Pepper. Balsalmic vinegar. Tahini. Hummus. Tomato sauce. Olives. Mushrooms. Limit these Grains: Prepackaged pasta or rice dishes. Prepackaged cereal with added sugar. Vegetables: Deep fried potatoes (french fries). Fruits: Fruit canned in syrup. Meats and other protein foods: Beef. Pork. Lamb. Poultry with skin. Hot dogs. Tomasa Blase. Dairy: Ice cream. Sour cream. Whole milk. Beverages: Juice. Sugar-sweetened soft drinks. Beer. Liquor and spirits. Fats and oils: Butter. Canola oil. Vegetable oil. Beef fat (tallow). Lard. Sweets and desserts: Cookies. Cakes. Pies. Candy. Seasoning and other foods: Mayonnaise. Premade sauces and marinades. The items listed may not be a complete list. Talk with your dietitian about what dietary choices are right for you. Summary The Mediterranean diet includes both food and lifestyle choices. Eat a variety of fresh fruits and vegetables, beans, nuts, seeds, and whole grains. Limit the amount of red meat and sweets that you eat. Talk with your health care provider about whether it is safe for you to drink red wine in moderation. This means 1 glass a day for nonpregnant women and 2 glasses a day for men. A glass of wine equals 5 oz (150 mL). This information is not intended to replace advice given to you by your health  care provider. Make sure you discuss any questions you have with your health care provider. Document Released: 06/10/2016 Document Revised: 07/13/2016 Document Reviewed: 06/10/2016 Elsevier Interactive Patient Education  2017 [employer].  We have sent a referral to [facility] for your MRI and they will call you directly to schedule your appointment. They are located at [address] [facility]. If you need to contact them directly please call [phone].

## 2022-10-04 NOTE — Progress Notes (Signed)
Assessment/Plan:   Dementia of unclear etiology  Mallory Estrada is a very pleasant 81 y.o. RH female with a history of arthritis, atrial fibrillation, depression, hyperlipidemia, hypothyroidism, ophthalmoplegic migraine, chronic back pain status post multiple procedures, seen today in follow up for memory loss. Patient is not on antidementia medication . MRI brain personally reviewed was remarkable for  minimal chronic small-vessel ischemic changes within the cerebral white matter, mild-to-moderate generalized cerebral atrophy and mild cerebellar atrophy. Neuropsychological evaluation 09/2022 concluded that etiology of the memory loss is unclear although there was no evidence of Alzheimer's disease based upon current testing. There is the possibility of an illness along the parkinsonian spectrum, namely progressive supranuclear palsy (PSP) or Lewy body dementia (LBD).   Recommendations:     Follow up in 6 months for memory. Recommend DaT scan for further evaluation given that there is a possibility of an illness along the parkinsonian spectrum (namely PSP or LBD), pending on the results, it may warrant a consultation at our movement disorders clinic.  Continue to follow-up mood with PCP Continue to control cardiovascular risk factors Referral to speech therapy    Subjective:    This patient is accompanied in the office by her daughter who supplements the history.  Previous records as well as any outside records available were reviewed prior to todays visit. Last seen on 02/18/2022, MoCA 15/30    Any changes in memory since last visit? "Same" " I don't feel any worse except some days when getting out of bed is harder, it may make me feel "less sharp". She reports difficulty "creating recent memories but ok with LTM".    repeats oneself?  Denies, except with timing for ex, this morning, she asked: "do we have to be there at the appt  at 1 pm or leave at 1 pm ?-daughter says. Disoriented  when walking into a room?  Sometimes she reports " The room feels rearranged, I am surprised the bedroom is where it is"-she says  Leaving objects in unusual places?  Patient denies   Ambulates  with difficulty?  Walker all the time, uses a wheelchair to go to her appointments Recent falls?  She had a fall recently  "flat on the face,  no LOC or  back of the head injury ", Any head injuries?  Patient denies   History of seizures?   Patient denies   Wandering behavior?  Patient denies   Patient drives?   Patient no longer drives "I sold my car" Any mood changes since last visit?  "From day to night I become a different person". At night she is more anxious. Any worsening depression?:  Patient denies   Hallucinations?  Patient denies   Paranoia?  Patient denies   Patient reports that sleeps well with vivid dreams they may feel very real to her, for ex: "the lady that came in my dream, she felt real, although I am aware it is a dream" . Denies REM behavior or sleepwalking. Sometimes she yells her daughter's name during the night, and it is "only when I  need to go to the bathroom"  History of sleep apnea? Not using the CPAP "she kind of graduated"-daughter says Any hygiene concerns?  Patient denies   Independent of bathing and dressing?  Endorsed  Does the patient needs help with medications? Daughter in charge although she includes patient as much as possible Who is in charge of the finances? Daughter is in charge but lets her participate  Any  changes in appetite?  Patient denies   Patient have trouble swallowing? "with water, not with food". She does endorse increased salivation, speech is slower but not different over the last 6 months Does the patient cook?  Patient denies   Any kitchen accidents such as leaving the stove on? Patient denies   Any headaches?  Patient denies   Double vision? some episodes, "I thought it was the glasses, need to check my vision"  Any focal numbness or tingling?   Patient denies   Chronic back pain she has a history of chronic neck pain and intermittent back pain.  Unilateral weakness?  Patient denies   Any tremors"  Occasionally, "when I try to grab a pen". Does not drop objects. Denies hypographia or hypophonia.  Any history of anosmia?  Patient denies   Any incontinence of urine?  Incontinence, was being followed by urogynrecologist not having any effects of the uro device, may discontinue its use Any bowel dysfunction?   Patient denies      Patient lives with: daughter and her husband     Neuropsychological evaluation 09/15/2022 results suggested ongoing impairments surrounding processing speed, attention/concentration, verbal fluency, and visuospatial  line orientation) abilities. Receptive language and executive functioning were unable to be assessed. Performance variability was exhibited across verbal learning and memory while confrontation was age-appropriate. The overall etiology is admittedly unclear at the present time.  There is no evidence of Alzheimer's disease based upon current testing. There is the possibility of an illness along the parkinsonian spectrum, namely progressive supranuclear palsy (PSP) or Lewy body dementia (LBD). While I cannot rule in these conditions with certainty, they should at least remain on her differential.      Initial Visit 01/13/22 The patient is seen in neurologic consultation at the request of Joaquim Nam, MD for the evaluation of memory.  The patient is accompanied by her daughter who supplements the history.  The patient has recently moved from [place] to live with her daughter.  This is a 89 y.o. year old RH  female who has had memory issues for about 2 or 3 years, after her daughter noticed that she could not hold the letters correctly, and her speech was slower.  She also was more repetitive in her questions, such as "are you going to be here in the morning "several times.  Occasionally she repeats herself.   She denies being disoriented when walking into her room.  She denies living objects in unusual places but sometimes she is looking for the remote control and is in her hands.  The patient had been seen while in [place] by neurology regarding these issues, but she cannot recall the details, or the formal diagnosis.  Records have been requested.  She ambulates with some difficulty.  She had significant history of degenerative disease in her back, having undergone several surgeries, including an L4-L5 compression fracture fusion, as well as a T12 L1-L2 fusion for DJD, which causes her ambulation issues, as well as gait instability.  The patient has been seen by neurosurgery while in [place], and needs to reestablish care here.  She denies vertigo, dizziness, syncope or presyncope.  She does have issues with back pain.  She denies numbness or tingling, or unilateral weakness or tremors.  Her last fall was in 2020, when she fell off the porch, never going to the hospital.  Her mood is stable, denies depression or irritability.  She is on psychotherapy to deal with the stress of having moved  from another place.  According to her daughter, she may have some sundowning.  She has very vivid dreams, but this has been present "throughout her life, she acts out in her dreams, imagining things, such as other people in her bed usually in strangers ", but denies hallucinations "these are vivid dreams ".  Her daughter states that she laughs at herself about these issues.  "She brings dream world into reality ".  She is unsure of sleepwalking, although sometimes "the walker is way back in the bed and she went to the bathroom without noticing ".  Denies paranoia.  There are no hygiene concerns, she may need some assistance with bathing and dressing.  Her medications are in a pillbox, her daughter supervises.  Daughter is in charge of the finances, and cooking. Her appetite is good, denies trouble swallowing.  She denies any headaches,  but reports that at times, her scalp hurts to the touch "denies double vision, anosmia, or history of seizures.  She denies urine incontinence, retention, constipation or diarrhea.  In the past, she may have been diagnosed with OSA, but never used CPAP.  She denies alcohol or tobacco history.  No family history of dementia.      MRI brain 02/08/22 No evidence of acute intracranial abnormality. Minimal chronic small-vessel ischemic changes within the cerebral white matter. Mild-to-moderate generalized cerebral atrophy. Comparatively mild cerebellar atrophy.   PREVIOUS MEDICATIONS:   CURRENT MEDICATIONS:  Outpatient Encounter Medications as of 10/04/2022  Medication Sig   acetaminophen (TYLENOL) 500 MG tablet Take 500 mg by mouth every 6 (six) hours as needed.   atorvastatin (LIPITOR) 10 MG tablet TAKE 1 TABLET DAILY   Cholecalciferol (VITAMIN D3) 125 MCG (5000 UT) CAPS Take by mouth.   cyclobenzaprine (FLEXERIL) 5 MG tablet Take 1 tablet (5 mg total) by mouth 3 (three) times daily as needed for muscle spasms.   estradiol (ESTRING) 7.5 MCG/24HR vaginal ring Place 2 mg vaginally every 3 (three) months. follow package directions   furosemide (LASIX) 20 MG tablet 1 tab a day.  If ankle swelling, take 2 tabs on MWF if needed.   KLOR-CON M20 20 MEQ tablet TAKE 1 TABLET DAILY   levothyroxine (SYNTHROID) 50 MCG tablet TAKE 1 TABLET 30 MINUTES BEFORE BREAKFAST. NEW PRESCRIPTION REQUEST   loratadine (CLARITIN) 10 MG tablet Take 1 tablet (10 mg total) by mouth daily.   magnesium gluconate (MAGONATE) 500 MG tablet Take 500 mg by mouth at bedtime.   metoprolol succinate (TOPROL XL) 25 MG 24 hr tablet Take 0.5 tablets (12.5 mg total) by mouth as needed (ELEVATED HEART RATE).   polyethylene glycol powder (GLYCOLAX/MIRALAX) 17 GM/SCOOP powder Take 17 g by mouth daily as needed (for constipation).   warfarin (COUMADIN) 2 MG tablet Take 2 tablets (4 mg total) by mouth daily. TAKE 2 TABLETS (4 MG TOTAL) BY MOUTH  DAILY OR AS DIRECTED BY ANTICOAGULATION CLINIC   [DISCONTINUED] doxylamine, Sleep, (UNISOM) 25 MG tablet Take 1 tablet (25 mg total) by mouth at bedtime as needed.   [DISCONTINUED] fosfomycin (MONUROL) 3 g PACK 1 dose every 10 days.   warfarin (COUMADIN) 2 MG tablet TAKE 2 TABLETS DAILY BY MOUTH, EXCEPT TAKE 1 TABLET ON MONDAYS, WEDNESDAYS AND FRIDAYS OR AS DIRECTED BY COUMADIN CLINIC   [DISCONTINUED] docusate sodium (COLACE) 50 MG capsule Take 2 capsules (100 mg total) by mouth at bedtime.   No facility-administered encounter medications on file as of 10/04/2022.        No data to display  01/14/2022    8:00 AM  Montreal Cognitive Assessment   Visuospatial/ Executive (0/5) 1  Naming (0/3) 2  Attention: Read list of digits (0/2) 1  Attention: Read list of letters (0/1) 1  Attention: Serial 7 subtraction starting at 100 (0/3) 1  Language: Repeat phrase (0/2) 2  Language : Fluency (0/1) 0  Abstraction (0/2) 1  Delayed Recall (0/5) 2  Orientation (0/6) 4  Total 15  Adjusted Score (based on education) 15    Objective:     PHYSICAL EXAMINATION:    VITALS:   Vitals:   10/04/22 1428  BP: 120/78  Pulse: 78  Resp: 18  SpO2: 95%    GEN:  The patient appears stated age and is in NAD. HEENT:  Normocephalic, atraumatic.   Neurological examination:  General: NAD, well-groomed, appears stated age. Orientation: The patient is alert. Oriented to person, place and not to date Cranial nerves: There is good facial symmetry. Mild hypomimia. Able to look up, but unable to look down. Laterally she moves her EOM normally. The speech is fluent and clear but slower than prior exam. No aphasia or dysarthria. Fund of knowledge is appropriate. Recent and remote memory are impaired. Attention and concentration are reduced.  Able to name objects and repeat phrases.  Hearing is intact to conversational tone.    Sensation: Sensation is intact to light touch throughout Motor: Strength  is at least antigravity x4 DTR's 1/4 UE/LE    Movement examination: Tone: There is normal tone in the LUE/BLE, RUE, slightly increased tone Abnormal movements:  very mils L hand intention tremor.  No myoclonus.  No asterixis.   Coordination:  There is no decremation with RAM's. Normal finger to nose  Gait and Station: The patient has difficulty arising out of a deep-seated chair without the use of the hands, needs assistance, gait not stable, stride short.  Unable to test Romberg.   Thank you for allowing us the opportunity to participate in the care of this nice patient. Please do not hesitate to contact us for any questions or concerns.   Total time spent on today's visit was 43 minutes dedicated to this patient today, preparing to see patient, examining the patient, ordering tests and/or medications and counseling the patient, documenting clinical information in the EHR or other health record, independently interpreting results and communicating results to the patient/family, discussing treatment and goals, answering patient's questions and coordinating care.  Cc:  Joaquim Namuncan, Graham S, MD  Marlowe KaysSara Wessie Shanks 10/05/2022 11:13 AM

## 2022-10-05 ENCOUNTER — Encounter: Payer: Self-pay | Admitting: Family Medicine

## 2022-10-05 ENCOUNTER — Ambulatory Visit (INDEPENDENT_AMBULATORY_CARE_PROVIDER_SITE_OTHER): Payer: Medicare Other | Admitting: Family Medicine

## 2022-10-05 VITALS — BP 122/78 | HR 65 | Temp 97.5°F | Ht 64.5 in | Wt 182.0 lb

## 2022-10-05 DIAGNOSIS — R519 Headache, unspecified: Secondary | ICD-10-CM | POA: Diagnosis not present

## 2022-10-05 MED ORDER — DOXYLAMINE SUCCINATE (SLEEP) 25 MG PO TABS: 25.0000 mg | ORAL_TABLET | Freq: Every evening | ORAL | Status: DC | PRN

## 2022-10-05 NOTE — Progress Notes (Unsigned)
Pretty constant x 1 week. Woke up "and I think I slept funny" initially.  Variable location, ie it moves around.  Some posterior neck pain, at posterior skull base.  Occ frontal pain.  Has been taking tylenol during the day and using the lidocaine patch at the base of her neck at night- only seems to help some. Used heat in the day.  No pain radiation down the arms.  No facial pain. Occ complaint of back pain but in unrecalled location.  Prev with some R maxillary pain. She has dentures.    Rare use of flexeril for back pain.  Massage helped.  She was likely straining her hamstring and back with standing.    She had DaT scan pending, with recent note from Dr. Milbert Coulter d/w pt.   She usually takes 25mg  unisom.  Rarely took 50mg  total but that helped, ie took a 25mg  at night and again later at [facility] when needed.

## 2022-10-05 NOTE — Patient Instructions (Addendum)
I would keep using the patches and try flexeril if needed. Ice vs heat are reasonable.   Update me as needed.  Take care.  Glad to see you.

## 2022-10-06 ENCOUNTER — Encounter: Payer: Self-pay | Admitting: Obstetrics and Gynecology

## 2022-10-06 NOTE — Assessment & Plan Note (Signed)
Could be from occipital neuralgia.  Anatomy and rationale discussed with patient. I would keep using the lidocaine patches and try flexeril if needed. Ice vs heat are reasonable.   Update me as needed.  No emergent symptoms.  30 minutes were devoted to patient care in this encounter (this includes time spent reviewing the patient's file/history, interviewing and examining the patient, counseling/reviewing plan with patient).

## 2022-10-07 ENCOUNTER — Other Ambulatory Visit: Payer: Self-pay

## 2022-10-07 ENCOUNTER — Ambulatory Visit (INDEPENDENT_AMBULATORY_CARE_PROVIDER_SITE_OTHER): Payer: Medicare Other

## 2022-10-07 ENCOUNTER — Encounter: Payer: Self-pay | Admitting: Family Medicine

## 2022-10-07 DIAGNOSIS — H532 Diplopia: Secondary | ICD-10-CM

## 2022-10-07 DIAGNOSIS — Z7901 Long term (current) use of anticoagulants: Secondary | ICD-10-CM

## 2022-10-07 LAB — POCT INR: INR: 2.6 (ref 2.0–3.0)

## 2022-10-07 MED ORDER — ESTRING 2 MG VA RING: 2.0000 mg | VAGINAL_RING | VAGINAL | Status: DC

## 2022-10-07 NOTE — Telephone Encounter (Signed)
Contacted pt and [name], pt's daughter, for INR results. F/u on this msg also.  Pt was seen by PCP for headaches on 12/5 with no mention of double vision at the time. Pt is reporting today she is having double vision but this is an ongoing condition and intermittent. She reports she has good days and bad concerning the double vision. She reports the bad days she has double vision all day and the good days she will not have it all day. She has had a recent eye exam with no abnormal findings that would cause double vision. Pt denies any weakness on one side, difficulty with balance, or facial drooping. Daughter checked pt's face and denies any drooping or abnormality while pt was smiling. Pt denies any other symptoms. Advised this information would be sent to PCP and this office would f/u with her and her daughter. Advised if pt developed any of the symptoms questioned about to call 911 or go to ER immediately. Pt and daughter verbalized understanding.

## 2022-10-07 NOTE — Progress Notes (Signed)
Pt was seen by PCP for headaches on 12/5 with no mention of double vision at the time. Pt is reporting today she is having double vision but this is an ongoing condition and intermittent. She reports she has good days and bad concerning the double vision. She reports the bad days she has double vision all day and the good days she will not have it all day. She has had a recent eye exam with no abnormal findings that would cause double vision. Pt denies any weakness on one side, difficulty with balance, or facial drooping. Daughter checked pt's face and denies any drooping or abnormality while pt was smiling. Pt denies any other symptoms. Advised this information would be sent to PCP and this office would f/u with her and her daughter. Advised if pt developed any of the symptoms questioned about to call 911 or go to ER immediately. Pt and daughter verbalized understanding.  Sent msg to PCP with information above.   Pt tests at home with help from her daughter, [name]. Results obtained through [facility]. Continue 2 tablets daily except take 1 tablet on Mondays. Recheck in one week.    Contacted [name] and pt and advised of dosing and recheck. Both verbalized understanding.

## 2022-10-07 NOTE — Patient Instructions (Addendum)
Pre visit review using our clinic review tool, if applicable. No additional management support is needed unless otherwise documented below in the visit note.  Continue 2 tablets daily except take 1 tablet on Mondays. Recheck in one week.

## 2022-10-07 NOTE — Telephone Encounter (Signed)
Attestation signed by Joaquim Nam, MD at 10/07/2022  3:09 PM   Given that, I would check CT.  If cause for double vision is seen, then we'll address.  If not cause seen, then would need neuro input.  I ordered the CT.  Agree with ER cautions.  Routed to neuro as FYI also.  I thank all involved.    Contacted [name] and advised CT has been ordered. Advised if pt develops any of the symptoms we discussed to call 911. [name] verbalized understanding.

## 2022-10-08 ENCOUNTER — Encounter: Payer: Self-pay | Admitting: *Deleted

## 2022-10-11 ENCOUNTER — Telehealth: Payer: Self-pay | Admitting: Family Medicine

## 2022-10-11 NOTE — Telephone Encounter (Signed)
Boneta Lucks with [facility] called and the appointment for today is being delayed until tomorrow per patient request. Call back number [phone].

## 2022-10-11 NOTE — Telephone Encounter (Signed)
Called and spoke with Boneta Lucks and advised okay per Dr. Para March.

## 2022-10-11 NOTE — Telephone Encounter (Signed)
Okay.  Thanks.

## 2022-10-12 ENCOUNTER — Telehealth: Payer: Self-pay | Admitting: Family Medicine

## 2022-10-12 DIAGNOSIS — F32A Depression, unspecified: Secondary | ICD-10-CM | POA: Diagnosis not present

## 2022-10-12 DIAGNOSIS — M1990 Unspecified osteoarthritis, unspecified site: Secondary | ICD-10-CM | POA: Diagnosis not present

## 2022-10-12 DIAGNOSIS — E785 Hyperlipidemia, unspecified: Secondary | ICD-10-CM | POA: Diagnosis not present

## 2022-10-12 DIAGNOSIS — G20A1 Parkinson's disease without dyskinesia, without mention of fluctuations: Secondary | ICD-10-CM | POA: Diagnosis not present

## 2022-10-12 DIAGNOSIS — I4891 Unspecified atrial fibrillation: Secondary | ICD-10-CM | POA: Diagnosis not present

## 2022-10-12 DIAGNOSIS — Z9181 History of falling: Secondary | ICD-10-CM | POA: Diagnosis not present

## 2022-10-12 DIAGNOSIS — F02A0 Dementia in other diseases classified elsewhere, mild, without behavioral disturbance, psychotic disturbance, mood disturbance, and anxiety: Secondary | ICD-10-CM | POA: Diagnosis not present

## 2022-10-12 DIAGNOSIS — Z7901 Long term (current) use of anticoagulants: Secondary | ICD-10-CM | POA: Diagnosis not present

## 2022-10-12 DIAGNOSIS — E039 Hypothyroidism, unspecified: Secondary | ICD-10-CM | POA: Diagnosis not present

## 2022-10-12 DIAGNOSIS — G43B0 Ophthalmoplegic migraine, not intractable: Secondary | ICD-10-CM | POA: Diagnosis not present

## 2022-10-12 DIAGNOSIS — M545 Low back pain, unspecified: Secondary | ICD-10-CM | POA: Diagnosis not present

## 2022-10-12 DIAGNOSIS — R1312 Dysphagia, oropharyngeal phase: Secondary | ICD-10-CM | POA: Diagnosis not present

## 2022-10-12 NOTE — Telephone Encounter (Signed)
Home Health verbal orders Caller Name: Boneta Lucks Agency Name: [facility]  Callback number: [phone]  Requesting Speech Therapy  Frequency: 1x week for 6 weeks  Requesting to add on evaluation for PT.   Please forward to [facility] pool or providers CMA

## 2022-10-12 NOTE — Telephone Encounter (Signed)
I have not receive an estimated delivery date. I can try to cal later today.

## 2022-10-13 ENCOUNTER — Ambulatory Visit
Admission: RE | Admit: 2022-10-13 | Discharge: 2022-10-13 | Disposition: A | Discharge status: Home / Self Care | Payer: Medicare Other | Source: Ambulatory Visit | Attending: Family Medicine | Admitting: Family Medicine

## 2022-10-13 ENCOUNTER — Telehealth: Payer: Self-pay | Admitting: Family Medicine

## 2022-10-13 DIAGNOSIS — H532 Diplopia: Secondary | ICD-10-CM

## 2022-10-13 NOTE — Telephone Encounter (Signed)
Please give the order.  Thanks.   

## 2022-10-13 NOTE — Telephone Encounter (Signed)
Noted. Will await CT report.  Thanks.

## 2022-10-13 NOTE — Telephone Encounter (Signed)
What happened with the fall?  Any injury or new symptoms?

## 2022-10-13 NOTE — Telephone Encounter (Signed)
Verbal orders left on secure VM for [employer]

## 2022-10-13 NOTE — Telephone Encounter (Signed)
Patient daughter [name] called in and stated that Mallory Estrada had a fall last night and wanted to make you aware. She also stated she is having a CT of her head done today and wasn't sure if he needed to update the referral. Please advise. Thank you!

## 2022-10-13 NOTE — Telephone Encounter (Addendum)
I spoke with [name] (DPR signed) pt fell backwards while walking and lost balance last night; pt had had some h/a prior to fall but no H/A now. Pt has been doing exercises given by Dr Para March and that seemed to help H/A pt having CT today due to occasional double vision: Dr Para March had ordered CT and pt is there now getting CT;. [name] saw the fall; pt hit her head on Laurels leg little generalized soreness all over body and no apparent injury. Pt did not lose consciousness. UC & ED precautions given and [name] voiced understanding. Sending note to Dr Para March.

## 2022-10-14 ENCOUNTER — Telehealth: Payer: Self-pay

## 2022-10-14 ENCOUNTER — Ambulatory Visit (INDEPENDENT_AMBULATORY_CARE_PROVIDER_SITE_OTHER): Payer: Medicare Other

## 2022-10-14 ENCOUNTER — Other Ambulatory Visit (HOSPITAL_COMMUNITY): Payer: Medicare Other

## 2022-10-14 DIAGNOSIS — Z7901 Long term (current) use of anticoagulants: Secondary | ICD-10-CM | POA: Diagnosis not present

## 2022-10-14 LAB — POCT INR: INR: 2.9 (ref 2.0–3.0)

## 2022-10-14 NOTE — Telephone Encounter (Signed)
Received VM from [place], pt's daughter, inquiring if it is ok for pt to use essential oils with turmeric, frankincense and black pepper. She wants to know if they will interact with pt's warfarin.   All will interact with warfarin. Turmeric and frankincense will increase bleeding risk and black pepper can decrease warfarin effects which will increase risk of blood clots.  Advised [name] all of the essential oils she mentioned above will interact with warfarin and should not be used. [name] verbalized understanding.

## 2022-10-14 NOTE — Telephone Encounter (Signed)
Agree. Thanks

## 2022-10-14 NOTE — Progress Notes (Signed)
Pt tests at home with help from her daughter, [name]. Results obtained through [facility]. Continue 2 tablets daily except take 1 tablet on Mondays. Recheck in one week.

## 2022-10-14 NOTE — Patient Instructions (Addendum)
Pre visit review using our clinic review tool, if applicable. No additional management support is needed unless otherwise documented below in the visit note.  Continue 2 tablets daily except take 1 tablet on Mondays. Recheck in one week.    

## 2022-10-15 ENCOUNTER — Ambulatory Visit: Payer: Medicare Other | Admitting: Obstetrics and Gynecology

## 2022-10-17 NOTE — Addendum Note (Signed)
Addended by: Joaquim Nam on: 10/17/2022 12:59 PM   Modules accepted: Orders

## 2022-10-19 ENCOUNTER — Ambulatory Visit: Payer: Medicare Other | Admitting: Obstetrics and Gynecology

## 2022-10-21 ENCOUNTER — Ambulatory Visit (INDEPENDENT_AMBULATORY_CARE_PROVIDER_SITE_OTHER): Payer: Medicare Other

## 2022-10-21 DIAGNOSIS — Z7901 Long term (current) use of anticoagulants: Secondary | ICD-10-CM | POA: Diagnosis not present

## 2022-10-21 LAB — POCT INR: INR: 3.2 — AB (ref 2.0–3.0)

## 2022-10-21 NOTE — Patient Instructions (Addendum)
Pre visit review using our clinic review tool, if applicable. No additional management support is needed unless otherwise documented below in the visit note.  Reduce dose today to take 1 tablet and then continue 2 tablets daily except take 1 tablet on Mondays. Recheck in one week.

## 2022-10-21 NOTE — Progress Notes (Addendum)
Pt tests at home with help from her daughter, [name]. Results obtained through [facility]. Reduce dose today to take 1 tablet and then continue 2 tablets daily except take 1 tablet on Mondays. Recheck in one week.   Pt's daughter verbalized understanding.

## 2022-10-27 LAB — HM DIABETES EYE EXAM

## 2022-10-28 LAB — POCT INR: INR: 2.3 (ref 2.0–3.0)

## 2022-10-29 ENCOUNTER — Ambulatory Visit (INDEPENDENT_AMBULATORY_CARE_PROVIDER_SITE_OTHER): Payer: Medicare Other

## 2022-10-29 DIAGNOSIS — Z7901 Long term (current) use of anticoagulants: Secondary | ICD-10-CM

## 2022-10-29 NOTE — Patient Instructions (Addendum)
Continue to take two 2 mg tablets (4 mg total) daily except take 1 tablet on Mondays. Recheck in one week.

## 2022-10-29 NOTE — Progress Notes (Signed)
Pt tests at home with help from her daughter, [name]. Result of 2.3 today obtained through [facility].   Continue to take two 2 mg tablets (4 mg total) daily except take 1 tablet on Mondays. Recheck in one week.

## 2022-11-04 ENCOUNTER — Ambulatory Visit (INDEPENDENT_AMBULATORY_CARE_PROVIDER_SITE_OTHER): Payer: Medicare Other

## 2022-11-04 DIAGNOSIS — Z7901 Long term (current) use of anticoagulants: Secondary | ICD-10-CM | POA: Diagnosis not present

## 2022-11-04 LAB — POCT INR: INR: 2.6 (ref 2.0–3.0)

## 2022-11-04 NOTE — Progress Notes (Signed)
Pt tests at home with help from her daughter, Berline Chough. Result of 26 today obtained through [facility].   Continue to take two 2 mg tablets (4 mg total) daily except take 1 tablet on Mondays. Recheck in one week.

## 2022-11-04 NOTE — Patient Instructions (Signed)
Pre visit review using our clinic review tool, if applicable. No additional management support is needed unless otherwise documented below in the visit note.  Continue to take two 2 mg tablets (4 mg total) daily except take 1 tablet on Mondays. Recheck in one week.  

## 2022-11-05 ENCOUNTER — Telehealth: Payer: Self-pay | Admitting: Family Medicine

## 2022-11-05 DIAGNOSIS — M25519 Pain in unspecified shoulder: Secondary | ICD-10-CM

## 2022-11-05 NOTE — Telephone Encounter (Signed)
[name]-pt's daughter [phone]  Right shoulder pain (opted out of surgery), but needs help with mobility, her daughter would like to see her use the arm more, patient holds the arm to limit pain  Referred by Dr[facility] Damita Dunnings [facility] and has been getting cortisone shots in that arm  [facility] [phone]) recommended that the patient see an an orthopedics MD  [facility] [place] confirmed that this provider is in network with her [employer]  If she needs an appointment with MD, please advise, if there is a virtual option they would like that, if not she can arrange for her to come into the office if needed for the referral

## 2022-11-06 NOTE — Addendum Note (Signed)
Addended by: Tonia Ghent on: 11/06/2022 08:36 PM   Modules accepted: Orders

## 2022-11-06 NOTE — Telephone Encounter (Signed)
I went ahead and put in the referral but just for clarity please document that the patient did consent for this referral.  Thanks.

## 2022-11-08 ENCOUNTER — Telehealth: Payer: Self-pay | Admitting: Physician Assistant

## 2022-11-08 NOTE — Telephone Encounter (Signed)
Notified daughter of the information I have its an out of pocket maxium 3950.00,She is going to call [employer]

## 2022-11-08 NOTE — Telephone Encounter (Signed)
Spoke with Berline Chough and patient did agree to the referral

## 2022-11-08 NOTE — Telephone Encounter (Signed)
Patient is sch for the DaT scan and it has not be Prior approved yet and the price is a big difference without the prior auth. They tod the patient daughter that they have reached out to our office several times   Patient DaT scan is  tomorrow at 9:45 please call patient daughter

## 2022-11-09 ENCOUNTER — Encounter (HOSPITAL_COMMUNITY): Payer: Medicare Other

## 2022-11-09 ENCOUNTER — Encounter (HOSPITAL_COMMUNITY): Payer: Self-pay

## 2022-11-12 ENCOUNTER — Ambulatory Visit (INDEPENDENT_AMBULATORY_CARE_PROVIDER_SITE_OTHER): Payer: Medicare Other

## 2022-11-12 DIAGNOSIS — E039 Hypothyroidism, unspecified: Secondary | ICD-10-CM | POA: Diagnosis not present

## 2022-11-12 DIAGNOSIS — Z7901 Long term (current) use of anticoagulants: Secondary | ICD-10-CM

## 2022-11-12 DIAGNOSIS — F02A0 Dementia in other diseases classified elsewhere, mild, without behavioral disturbance, psychotic disturbance, mood disturbance, and anxiety: Secondary | ICD-10-CM | POA: Diagnosis not present

## 2022-11-12 DIAGNOSIS — I4891 Unspecified atrial fibrillation: Secondary | ICD-10-CM | POA: Diagnosis not present

## 2022-11-12 DIAGNOSIS — M1990 Unspecified osteoarthritis, unspecified site: Secondary | ICD-10-CM | POA: Diagnosis not present

## 2022-11-12 DIAGNOSIS — E785 Hyperlipidemia, unspecified: Secondary | ICD-10-CM | POA: Diagnosis not present

## 2022-11-12 DIAGNOSIS — M545 Low back pain, unspecified: Secondary | ICD-10-CM | POA: Diagnosis not present

## 2022-11-12 DIAGNOSIS — Z9181 History of falling: Secondary | ICD-10-CM | POA: Diagnosis not present

## 2022-11-12 DIAGNOSIS — G20A1 Parkinson's disease without dyskinesia, without mention of fluctuations: Secondary | ICD-10-CM | POA: Diagnosis not present

## 2022-11-12 DIAGNOSIS — G43B0 Ophthalmoplegic migraine, not intractable: Secondary | ICD-10-CM | POA: Diagnosis not present

## 2022-11-12 DIAGNOSIS — R1312 Dysphagia, oropharyngeal phase: Secondary | ICD-10-CM | POA: Diagnosis not present

## 2022-11-12 DIAGNOSIS — F32A Depression, unspecified: Secondary | ICD-10-CM | POA: Diagnosis not present

## 2022-11-12 LAB — POCT INR: INR: 2.9 (ref 2.0–3.0)

## 2022-11-12 NOTE — Progress Notes (Signed)
Pt tests at home with help from her daughter, Berline Chough. Result of 2.9 today obtained through [facility] portal.   Continue to take two 2 mg tablets (4 mg total) daily except take 1 tablet on Mondays. Recheck in one week.

## 2022-11-12 NOTE — Patient Instructions (Addendum)
Pre visit review using our clinic review tool, if applicable. No additional management support is needed unless otherwise documented below in the visit note.  Continue to take two 2 mg tablets (4 mg total) daily except take 1 tablet on Mondays. Recheck in one week.

## 2022-11-15 ENCOUNTER — Telehealth: Payer: Self-pay | Admitting: Family Medicine

## 2022-11-15 DIAGNOSIS — E039 Hypothyroidism, unspecified: Secondary | ICD-10-CM | POA: Diagnosis not present

## 2022-11-15 DIAGNOSIS — Z7901 Long term (current) use of anticoagulants: Secondary | ICD-10-CM | POA: Diagnosis not present

## 2022-11-15 DIAGNOSIS — G43B0 Ophthalmoplegic migraine, not intractable: Secondary | ICD-10-CM | POA: Diagnosis not present

## 2022-11-15 DIAGNOSIS — M1990 Unspecified osteoarthritis, unspecified site: Secondary | ICD-10-CM | POA: Diagnosis not present

## 2022-11-15 DIAGNOSIS — Z9181 History of falling: Secondary | ICD-10-CM | POA: Diagnosis not present

## 2022-11-15 DIAGNOSIS — R1312 Dysphagia, oropharyngeal phase: Secondary | ICD-10-CM | POA: Diagnosis not present

## 2022-11-15 DIAGNOSIS — M545 Low back pain, unspecified: Secondary | ICD-10-CM | POA: Diagnosis not present

## 2022-11-15 DIAGNOSIS — G20A1 Parkinson's disease without dyskinesia, without mention of fluctuations: Secondary | ICD-10-CM | POA: Diagnosis not present

## 2022-11-15 DIAGNOSIS — F32A Depression, unspecified: Secondary | ICD-10-CM | POA: Diagnosis not present

## 2022-11-15 DIAGNOSIS — E785 Hyperlipidemia, unspecified: Secondary | ICD-10-CM | POA: Diagnosis not present

## 2022-11-15 DIAGNOSIS — F02A0 Dementia in other diseases classified elsewhere, mild, without behavioral disturbance, psychotic disturbance, mood disturbance, and anxiety: Secondary | ICD-10-CM | POA: Diagnosis not present

## 2022-11-15 DIAGNOSIS — I4891 Unspecified atrial fibrillation: Secondary | ICD-10-CM | POA: Diagnosis not present

## 2022-11-15 NOTE — Telephone Encounter (Signed)
Home Health verbal orders Caller Name: [place] Name: [facility] [place]  Callback number: [phone]  Requesting Speech:  Reason: extend  Frequency: 1x a week for 3wks  Please forward to [facility] pool or providers CMA

## 2022-11-16 DIAGNOSIS — M25511 Pain in right shoulder: Secondary | ICD-10-CM | POA: Diagnosis not present

## 2022-11-16 DIAGNOSIS — M25512 Pain in left shoulder: Secondary | ICD-10-CM | POA: Diagnosis not present

## 2022-11-16 NOTE — Telephone Encounter (Signed)
Please give the order.  Thanks.   

## 2022-11-16 NOTE — Telephone Encounter (Signed)
Let verbal orders on secure VM for [facility].

## 2022-11-17 ENCOUNTER — Other Ambulatory Visit: Payer: Self-pay | Admitting: Family Medicine

## 2022-11-17 DIAGNOSIS — E039 Hypothyroidism, unspecified: Secondary | ICD-10-CM | POA: Diagnosis not present

## 2022-11-17 DIAGNOSIS — Z7901 Long term (current) use of anticoagulants: Secondary | ICD-10-CM | POA: Diagnosis not present

## 2022-11-17 DIAGNOSIS — Z9181 History of falling: Secondary | ICD-10-CM | POA: Diagnosis not present

## 2022-11-17 DIAGNOSIS — G20A1 Parkinson's disease without dyskinesia, without mention of fluctuations: Secondary | ICD-10-CM | POA: Diagnosis not present

## 2022-11-17 DIAGNOSIS — G43B0 Ophthalmoplegic migraine, not intractable: Secondary | ICD-10-CM | POA: Diagnosis not present

## 2022-11-17 DIAGNOSIS — M1990 Unspecified osteoarthritis, unspecified site: Secondary | ICD-10-CM | POA: Diagnosis not present

## 2022-11-17 DIAGNOSIS — E785 Hyperlipidemia, unspecified: Secondary | ICD-10-CM

## 2022-11-17 DIAGNOSIS — I4891 Unspecified atrial fibrillation: Secondary | ICD-10-CM | POA: Diagnosis not present

## 2022-11-17 DIAGNOSIS — F02A0 Dementia in other diseases classified elsewhere, mild, without behavioral disturbance, psychotic disturbance, mood disturbance, and anxiety: Secondary | ICD-10-CM | POA: Diagnosis not present

## 2022-11-17 DIAGNOSIS — F32A Depression, unspecified: Secondary | ICD-10-CM | POA: Diagnosis not present

## 2022-11-17 DIAGNOSIS — I482 Chronic atrial fibrillation, unspecified: Secondary | ICD-10-CM

## 2022-11-17 DIAGNOSIS — M545 Low back pain, unspecified: Secondary | ICD-10-CM | POA: Diagnosis not present

## 2022-11-17 DIAGNOSIS — R1312 Dysphagia, oropharyngeal phase: Secondary | ICD-10-CM | POA: Diagnosis not present

## 2022-11-18 ENCOUNTER — Ambulatory Visit (INDEPENDENT_AMBULATORY_CARE_PROVIDER_SITE_OTHER): Payer: Medicare Other

## 2022-11-18 DIAGNOSIS — Z7901 Long term (current) use of anticoagulants: Secondary | ICD-10-CM | POA: Diagnosis not present

## 2022-11-18 DIAGNOSIS — H524 Presbyopia: Secondary | ICD-10-CM | POA: Diagnosis not present

## 2022-11-18 LAB — POCT INR: INR: 3.2 — AB (ref 2.0–3.0)

## 2022-11-18 MED ORDER — WARFARIN SODIUM 2 MG PO TABS: ORAL_TABLET | ORAL | 1 refills | Status: DC

## 2022-11-18 NOTE — Patient Instructions (Addendum)
Pre visit review using our clinic review tool, if applicable. No additional management support is needed unless otherwise documented below in the visit note.  Reduce dose today to take 1 tablet (2 mg) and the continue to take 2 tablets (4 mg total) daily except take 1 tablet on Mondays. Recheck in one week.

## 2022-11-18 NOTE — Progress Notes (Addendum)
Pt tests at home with help from her daughter, Berline Chough. Result of 3.2 today obtained through [facility] portal.   Reduce dose today to take 1 tablet (2 mg) and the continue to take 2 tablets (4 mg total) daily except take 1 tablet on Mondays. Recheck in one week.   Berline Chough reports pt needs a refill sent to [facility]. Sent in refill.

## 2022-11-22 ENCOUNTER — Other Ambulatory Visit: Payer: Self-pay

## 2022-11-22 ENCOUNTER — Encounter: Payer: Self-pay | Admitting: Neurology

## 2022-11-22 DIAGNOSIS — R202 Paresthesia of skin: Secondary | ICD-10-CM

## 2022-11-23 ENCOUNTER — Other Ambulatory Visit (INDEPENDENT_AMBULATORY_CARE_PROVIDER_SITE_OTHER): Payer: Medicare Other

## 2022-11-23 DIAGNOSIS — I482 Chronic atrial fibrillation, unspecified: Secondary | ICD-10-CM | POA: Diagnosis not present

## 2022-11-23 DIAGNOSIS — E785 Hyperlipidemia, unspecified: Secondary | ICD-10-CM

## 2022-11-23 LAB — COMPREHENSIVE METABOLIC PANEL
ALT: 11 U/L (ref 0–35)
AST: 15 U/L (ref 0–37)
Albumin: 4.2 g/dL (ref 3.5–5.2)
Alkaline Phosphatase: 62 U/L (ref 39–117)
BUN: 35 mg/dL — ABNORMAL HIGH (ref 6–23)
CO2: 30 mEq/L (ref 19–32)
Calcium: 9.8 mg/dL (ref 8.4–10.5)
Chloride: 104 mEq/L (ref 96–112)
Creatinine, Ser: 1.11 mg/dL (ref 0.40–1.20)
GFR: 46.86 mL/min — ABNORMAL LOW (ref >60.00)
Glucose, Bld: 88 mg/dL (ref 70–99)
Potassium: 4.3 mEq/L (ref 3.5–5.1)
Sodium: 142 mEq/L (ref 135–145)
Total Bilirubin: 0.6 mg/dL (ref 0.2–1.2)
Total Protein: 6.7 g/dL (ref 6.0–8.3)

## 2022-11-23 LAB — CBC WITH DIFFERENTIAL/PLATELET
Basophils Absolute: 0 10*3/uL (ref 0.0–0.1)
Basophils Relative: 0.8 % (ref 0.0–3.0)
Eosinophils Absolute: 0.1 10*3/uL (ref 0.0–0.7)
Eosinophils Relative: 2 % (ref 0.0–5.0)
HCT: 38.4 % (ref 36.0–46.0)
Hemoglobin: 13.1 g/dL (ref 12.0–15.0)
Lymphocytes Relative: 34.3 % (ref 12.0–46.0)
Lymphs Abs: 1.4 10*3/uL (ref 0.7–4.0)
MCHC: 34.1 g/dL (ref 30.0–36.0)
MCV: 93.9 fl (ref 78.0–100.0)
Monocytes Absolute: 0.3 10*3/uL (ref 0.1–1.0)
Monocytes Relative: 8.4 % (ref 3.0–12.0)
Neutro Abs: 2.2 10*3/uL (ref 1.4–7.7)
Neutrophils Relative %: 54.5 % (ref 43.0–77.0)
Platelets: 186 10*3/uL (ref 150.0–400.0)
RBC: 4.09 Mil/uL (ref 3.87–5.11)
RDW: 13.7 % (ref 11.5–15.5)
WBC: 4.1 10*3/uL (ref 4.0–10.5)

## 2022-11-23 LAB — LIPID PANEL
Cholesterol: 170 mg/dL (ref 0–200)
HDL: 55.1 mg/dL (ref >39.00)
LDL Cholesterol: 98 mg/dL (ref 0–99)
NonHDL: 114.8
Total CHOL/HDL Ratio: 3
Triglycerides: 85 mg/dL (ref 0.0–149.0)
VLDL: 17 mg/dL (ref 0.0–40.0)

## 2022-11-23 LAB — TSH: TSH: 2.88 u[IU]/mL (ref 0.35–5.50)

## 2022-11-24 DIAGNOSIS — Z9181 History of falling: Secondary | ICD-10-CM | POA: Diagnosis not present

## 2022-11-24 DIAGNOSIS — E785 Hyperlipidemia, unspecified: Secondary | ICD-10-CM | POA: Diagnosis not present

## 2022-11-24 DIAGNOSIS — R1312 Dysphagia, oropharyngeal phase: Secondary | ICD-10-CM | POA: Diagnosis not present

## 2022-11-24 DIAGNOSIS — F02A0 Dementia in other diseases classified elsewhere, mild, without behavioral disturbance, psychotic disturbance, mood disturbance, and anxiety: Secondary | ICD-10-CM | POA: Diagnosis not present

## 2022-11-24 DIAGNOSIS — Z7901 Long term (current) use of anticoagulants: Secondary | ICD-10-CM | POA: Diagnosis not present

## 2022-11-24 DIAGNOSIS — G20A1 Parkinson's disease without dyskinesia, without mention of fluctuations: Secondary | ICD-10-CM | POA: Diagnosis not present

## 2022-11-24 DIAGNOSIS — I4891 Unspecified atrial fibrillation: Secondary | ICD-10-CM | POA: Diagnosis not present

## 2022-11-24 DIAGNOSIS — E039 Hypothyroidism, unspecified: Secondary | ICD-10-CM | POA: Diagnosis not present

## 2022-11-24 DIAGNOSIS — M545 Low back pain, unspecified: Secondary | ICD-10-CM | POA: Diagnosis not present

## 2022-11-24 DIAGNOSIS — G43B0 Ophthalmoplegic migraine, not intractable: Secondary | ICD-10-CM | POA: Diagnosis not present

## 2022-11-24 DIAGNOSIS — M1990 Unspecified osteoarthritis, unspecified site: Secondary | ICD-10-CM | POA: Diagnosis not present

## 2022-11-24 DIAGNOSIS — F32A Depression, unspecified: Secondary | ICD-10-CM | POA: Diagnosis not present

## 2022-11-25 ENCOUNTER — Ambulatory Visit: Payer: Medicare Other

## 2022-11-25 ENCOUNTER — Ambulatory Visit (INDEPENDENT_AMBULATORY_CARE_PROVIDER_SITE_OTHER): Payer: Medicare Other

## 2022-11-25 VITALS — Ht 64.5 in | Wt 182.0 lb

## 2022-11-25 DIAGNOSIS — M1990 Unspecified osteoarthritis, unspecified site: Secondary | ICD-10-CM | POA: Diagnosis not present

## 2022-11-25 DIAGNOSIS — R1312 Dysphagia, oropharyngeal phase: Secondary | ICD-10-CM | POA: Diagnosis not present

## 2022-11-25 DIAGNOSIS — Z0000 Encounter for general adult medical examination without abnormal findings: Secondary | ICD-10-CM | POA: Diagnosis not present

## 2022-11-25 DIAGNOSIS — F02A0 Dementia in other diseases classified elsewhere, mild, without behavioral disturbance, psychotic disturbance, mood disturbance, and anxiety: Secondary | ICD-10-CM | POA: Diagnosis not present

## 2022-11-25 DIAGNOSIS — M545 Low back pain, unspecified: Secondary | ICD-10-CM | POA: Diagnosis not present

## 2022-11-25 DIAGNOSIS — G43B0 Ophthalmoplegic migraine, not intractable: Secondary | ICD-10-CM | POA: Diagnosis not present

## 2022-11-25 DIAGNOSIS — Z9181 History of falling: Secondary | ICD-10-CM | POA: Diagnosis not present

## 2022-11-25 DIAGNOSIS — Z7901 Long term (current) use of anticoagulants: Secondary | ICD-10-CM | POA: Diagnosis not present

## 2022-11-25 DIAGNOSIS — G20A1 Parkinson's disease without dyskinesia, without mention of fluctuations: Secondary | ICD-10-CM | POA: Diagnosis not present

## 2022-11-25 DIAGNOSIS — I4891 Unspecified atrial fibrillation: Secondary | ICD-10-CM | POA: Diagnosis not present

## 2022-11-25 DIAGNOSIS — F32A Depression, unspecified: Secondary | ICD-10-CM | POA: Diagnosis not present

## 2022-11-25 DIAGNOSIS — E785 Hyperlipidemia, unspecified: Secondary | ICD-10-CM | POA: Diagnosis not present

## 2022-11-25 DIAGNOSIS — E039 Hypothyroidism, unspecified: Secondary | ICD-10-CM | POA: Diagnosis not present

## 2022-11-25 LAB — POCT INR: INR: 1.9 — AB (ref 2.0–3.0)

## 2022-11-25 NOTE — Patient Instructions (Addendum)
Pre visit review using our clinic review tool, if applicable. No additional management support is needed unless otherwise documented below in the visit note.  Increase dose today to take 2 1/2 tablets (5 mg) and the continue to take 2 tablets (4 mg total) daily except take 1 tablet on Mondays. Recheck in one week.

## 2022-11-25 NOTE — Patient Instructions (Signed)
[name] , Thank you for taking time to come for your Medicare Wellness Visit. I appreciate your ongoing commitment to your health goals. Please review the following plan we discussed and let me know if I can assist you in the future.   These are the goals we discussed:  Goals      DIET - EAT MORE FRUITS AND VEGETABLES        This is a list of the screening recommended for you and due dates:  Health Maintenance  Topic Date Due   DTaP/Tdap/Td vaccine (1 - Tdap) Never done   DEXA scan (bone density measurement)  Never done   COVID-19 Vaccine (4 - 2023-24 season) 07/02/2022   Medicare Annual Wellness Visit  11/26/2023   Pneumonia Vaccine  Completed   Flu Shot  Completed   Zoster (Shingles) Vaccine  Completed   HPV Vaccine  Aged Out    Advanced directives: no  Conditions/risks identified: none  Next appointment: Follow up in one year for your annual wellness visit 11/28/23 @ 2:15 pm by phone   Preventive Care 65 Years and Older, Female Preventive care refers to lifestyle choices and visits with your health care provider that can promote health and wellness. What does preventive care include? A yearly physical exam. This is also called an annual well check. Dental exams once or twice a year. Routine eye exams. Ask your health care provider how often you should have your eyes checked. Personal lifestyle choices, including: Daily care of your teeth and gums. Regular physical activity. Eating a healthy diet. Avoiding tobacco and drug use. Limiting alcohol use. Practicing safe sex. Taking low-dose aspirin every day. Taking vitamin and mineral supplements as recommended by your health care provider. What happens during an annual well check? The services and screenings done by your health care provider during your annual well check will depend on your age, overall health, lifestyle risk factors, and family history of disease. Counseling  Your health care provider may ask you  questions about your: Alcohol use. Tobacco use. Drug use. Emotional well-being. Home and relationship well-being. Sexual activity. Eating habits. History of falls. Memory and ability to understand (cognition). Work and work Statistician. Reproductive health. Screening  You may have the following tests or measurements: Height, weight, and BMI. Blood pressure. Lipid and cholesterol levels. These may be checked every 5 years, or more frequently if you are over 12 years old. Skin check. Lung cancer screening. You may have this screening every year starting at age 63 if you have a 30-pack-year history of smoking and currently smoke or have quit within the past 15 years. Fecal occult blood test (FOBT) of the stool. You may have this test every year starting at age 73. Flexible sigmoidoscopy or colonoscopy. You may have a sigmoidoscopy every 5 years or a colonoscopy every 10 years starting at age 17. Hepatitis C blood test. Hepatitis B blood test. Sexually transmitted disease (STD) testing. Diabetes screening. This is done by checking your blood sugar (glucose) after you have not eaten for a while (fasting). You may have this done every 1-3 years. Bone density scan. This is done to screen for osteoporosis. You may have this done starting at age 79. Mammogram. This may be done every 1-2 years. Talk to your health care provider about how often you should have regular mammograms. Talk with your health care provider about your test results, treatment options, and if necessary, the need for more tests. Vaccines  Your health care provider may  recommend certain vaccines, such as: Influenza vaccine. This is recommended every year. Tetanus, diphtheria, and acellular pertussis (Tdap, Td) vaccine. You may need a Td booster every 10 years. Zoster vaccine. You may need this after age 74. Pneumococcal 13-valent conjugate (PCV13) vaccine. One dose is recommended after age 73. Pneumococcal polysaccharide  (PPSV23) vaccine. One dose is recommended after age 69. Talk to your health care provider about which screenings and vaccines you need and how often you need them. This information is not intended to replace advice given to you by your health care provider. Make sure you discuss any questions you have with your health care provider. Document Released: 11/14/2015 Document Revised: 07/07/2016 Document Reviewed: 08/19/2015 Elsevier Interactive Patient Education  2017 [place] Prevention in the Home Falls can cause injuries. They can happen to people of all ages. There are many things you can do to make your home safe and to help prevent falls. What can I do on the outside of my home? Regularly fix the edges of walkways and driveways and fix any cracks. Remove anything that might make you trip as you walk through a door, such as a raised step or threshold. Trim any bushes or trees on the path to your home. Use bright outdoor lighting. Clear any walking paths of anything that might make someone trip, such as rocks or tools. Regularly check to see if handrails are loose or broken. Make sure that both sides of any steps have handrails. Any raised decks and porches should have guardrails on the edges. Have any leaves, snow, or ice cleared regularly. Use sand or salt on walking paths during winter. Clean up any spills in your garage right away. This includes oil or grease spills. What can I do in the bathroom? Use night lights. Install grab bars by the toilet and in the tub and shower. Do not use towel bars as grab bars. Use non-skid mats or decals in the tub or shower. If you need to sit down in the shower, use a plastic, non-slip stool. Keep the floor dry. Clean up any water that spills on the floor as soon as it happens. Remove soap buildup in the tub or shower regularly. Attach bath mats securely with double-sided non-slip rug tape. Do not have throw rugs and other things on the  floor that can make you trip. What can I do in the bedroom? Use night lights. Make sure that you have a light by your bed that is easy to reach. Do not use any sheets or blankets that are too big for your bed. They should not hang down onto the floor. Have a firm chair that has side arms. You can use this for support while you get dressed. Do not have throw rugs and other things on the floor that can make you trip. What can I do in the kitchen? Clean up any spills right away. Avoid walking on wet floors. Keep items that you use a lot in easy-to-reach places. If you need to reach something above you, use a strong step stool that has a grab bar. Keep electrical cords out of the way. Do not use floor polish or wax that makes floors slippery. If you must use wax, use non-skid floor wax. Do not have throw rugs and other things on the floor that can make you trip. What can I do with my stairs? Do not leave any items on the stairs. Make sure that there are handrails on both sides of  the stairs and use them. Fix handrails that are broken or loose. Make sure that handrails are as long as the stairways. Check any carpeting to make sure that it is firmly attached to the stairs. Fix any carpet that is loose or worn. Avoid having throw rugs at the top or bottom of the stairs. If you do have throw rugs, attach them to the floor with carpet tape. Make sure that you have a light switch at the top of the stairs and the bottom of the stairs. If you do not have them, ask someone to add them for you. What else can I do to help prevent falls? Wear shoes that: Do not have high heels. Have rubber bottoms. Are comfortable and fit you well. Are closed at the toe. Do not wear sandals. If you use a stepladder: Make sure that it is fully opened. Do not climb a closed stepladder. Make sure that both sides of the stepladder are locked into place. Ask someone to hold it for you, if possible. Clearly mark and make  sure that you can see: Any grab bars or handrails. First and last steps. Where the edge of each step is. Use tools that help you move around (mobility aids) if they are needed. These include: Canes. Walkers. Scooters. Crutches. Turn on the lights when you go into a dark area. Replace any light bulbs as soon as they burn out. Set up your furniture so you have a clear path. Avoid moving your furniture around. If any of your floors are uneven, fix them. If there are any pets around you, be aware of where they are. Review your medicines with your doctor. Some medicines can make you feel dizzy. This can increase your chance of falling. Ask your doctor what other things that you can do to help prevent falls. This information is not intended to replace advice given to you by your health care provider. Make sure you discuss any questions you have with your health care provider. Document Released: 08/14/2009 Document Revised: 03/25/2016 Document Reviewed: 11/22/2014 Elsevier Interactive Patient Education  2017 [employer].

## 2022-11-25 NOTE — Progress Notes (Addendum)
Pt's daughter reports pt ate more spinach than normal due to being slightly supra therapeutic last week.  Increase dose today to take 2 1/2 tablets (5 mg) and the continue to take 2 tablets (4 mg total) daily except take 1 tablet on Mondays. Recheck in one week. Advised [name],pt's daughter, of dosing. [name] verbalized understanding.

## 2022-11-25 NOTE — Progress Notes (Signed)
Virtual Visit via Telephone Note  I connected with  Mallory Estrada on 11/25/22 at  1:30 PM EST by telephone and verified that I am speaking with the correct person using two identifiers.  Location: Patient: home Provider: [facility] Persons participating in the virtual visit: patient/Nurse Health Advisor   I discussed the limitations, risks, security and privacy concerns of performing an evaluation and management service by telephone and the availability of in person appointments. The patient expressed understanding and agreed to proceed.  Interactive audio and video telecommunications were attempted between this nurse and patient, however failed, due to patient having technical difficulties OR patient did not have access to video capability.  We continued and completed visit with audio only.  Some vital signs may be absent or patient reported.   Hal Hope, LPN  Subjective:   Mallory Estrada is a 82 y.o. female who presents for Medicare Annual (Subsequent) preventive examination.  Review of Systems     Cardiac Risk Factors include: advanced age (>39men, >72 women);hypertension     Objective:    There were no vitals filed for this visit. There is no height or weight on file to calculate BMI.     11/25/2022    1:37 PM 10/04/2022    1:32 PM 02/18/2022   11:23 AM 01/13/2022    1:39 PM  Advanced Directives  Does Patient Have a Medical Advance Directive? No Yes Yes Yes  Would patient like information on creating a medical advance directive? No - Patient declined       Current Medications (verified) Outpatient Encounter Medications as of 11/25/2022  Medication Sig   acetaminophen (TYLENOL) 500 MG tablet Take 500 mg by mouth every 6 (six) hours as needed.   atorvastatin (LIPITOR) 10 MG tablet TAKE 1 TABLET DAILY   Cholecalciferol (VITAMIN D3) 125 MCG (5000 UT) CAPS Take by mouth.   cyclobenzaprine (FLEXERIL) 5 MG tablet Take 1 tablet (5 mg total) by mouth 3 (three)  times daily as needed for muscle spasms.   doxylamine, Sleep, (UNISOM) 25 MG tablet Take 1-2 tablets (25-50 mg total) by mouth at bedtime as needed.   estradiol (ESTRING) 7.5 MCG/24HR vaginal ring Place 2 mg vaginally every 3 (three) months. follow package directions   furosemide (LASIX) 20 MG tablet 1 tab a day.  If ankle swelling, take 2 tabs on MWF if needed.   KLOR-CON M20 20 MEQ tablet TAKE 1 TABLET DAILY   levothyroxine (SYNTHROID) 50 MCG tablet TAKE 1 TABLET 30 MINUTES BEFORE BREAKFAST. NEW PRESCRIPTION REQUEST   loratadine (CLARITIN) 10 MG tablet Take 1 tablet (10 mg total) by mouth daily.   magnesium gluconate (MAGONATE) 500 MG tablet Take 500 mg by mouth at bedtime.   metoprolol succinate (TOPROL XL) 25 MG 24 hr tablet Take 0.5 tablets (12.5 mg total) by mouth as needed (ELEVATED HEART RATE).   polyethylene glycol powder (GLYCOLAX/MIRALAX) 17 GM/SCOOP powder Take 17 g by mouth daily as needed (for constipation).   warfarin (COUMADIN) 2 MG tablet TAKE 2 TABLETS (4 MG TOTAL) BY MOUTH DAILY EXCEPT TAKE 1 TABLET (2 MG) ON MONDAYS OR AS DIRECTED BY ANTICOAGULATION CLINIC   warfarin (COUMADIN) 2 MG tablet TAKE 2 TABLETS DAILY BY MOUTH, EXCEPT TAKE 1 TABLET ON MONDAYS, WEDNESDAYS AND FRIDAYS OR AS DIRECTED BY COUMADIN CLINIC   No facility-administered encounter medications on file as of 11/25/2022.    Allergies (verified) Fentanyl, Gabapentin, Ketamine, Macrobid [nitrofurantoin], Memantine, and Sulfa antibiotics   History: Past Medical History:  Diagnosis Date   Arthritis of finger of both hands 06/17/2021   Atrial fibrillation    Noted postop.   Gait abnormality    H/O laminectomy 01/14/2022   Hip arthritis    Hyperlipidemia    Hypothyroidism    Insomnia 02/18/2022   Left-sided chest wall pain 10/13/2021   Mild dementia, unclear etiology 09/15/2022   Nonintractable headache 11/02/2021   Ophthalmoplegic migraine    Pulmonary nodule 06/09/2022   CXR 06/2022    Recurrent UTI  03/26/2021   Refusal of blood transfusions as patient is Jehovah's Witness 12/29/2020   Shoulder pain 09/02/2021   SUI (stress urinary incontinence, female)    Toe pain 09/02/2021   Past Surgical History:  Procedure Laterality Date   BACK SURGERY     BREAST BIOPSY     HIP SURGERY Bilateral    KNEE SURGERY Bilateral    TONSILLECTOMY AND ADENOIDECTOMY     Family History  Problem Relation Age of Onset   Arthritis Mother    Arthritis Father    Diabetes Father    Heart disease Father    Arthritis Sister    Heart disease Brother    Colon cancer Brother    Heart attack Brother    Breast cancer Maternal Aunt    Social History   Socioeconomic History   Marital status: Widowed    Spouse name: Not on file   Number of children: 3   Years of education: 16   Highest education level: Bachelor's degree (e.g., BA, AB, BS)  Occupational History   Occupation: Retired    Comment: Runner, broadcasting/film/video  Tobacco Use   Smoking status: Former    Types: Cigarettes    Quit date: 11/01/1970    Years since quitting: 52.1   Smokeless tobacco: Never  Vaping Use   Vaping Use: Never used  Substance and Sexual Activity   Alcohol use: Yes    Comment: rare   Drug use: Never   Sexual activity: Not Currently  Other Topics Concern   Not on file  Social History Narrative   From [place] to [facility] 2022    Insurance account manager, principal, 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     Widowed.   Attended college in [employer]   Social Determinants of Health   Financial Resource Strain: Low Risk  (11/25/2022)   Overall Financial Resource Strain (CARDIA)    Difficulty of Paying Living Expenses: Not hard at all  Food Insecurity: No Food Insecurity (11/25/2022)   Hunger Vital Sign    Worried About Running Out of Food in the Last Year: Never true    Ran Out of Food in the Last Year: Never true  Transportation Needs: No Transportation Needs (11/25/2022)   PRAPARE - Administrator, Civil Service (Medical): No     Lack of Transportation (Non-Medical): No  Physical Activity: Insufficiently Active (11/25/2022)   Exercise Vital Sign    Days of Exercise per Week: 4 days    Minutes of Exercise per Session: 30 min  Stress: No Stress Concern Present (11/25/2022)   [employer] of Occupational Health - Occupational Stress Questionnaire    Feeling of Stress : Not at all  Social Connections: Moderately Isolated (11/25/2022)   Social Connection and Isolation Panel [NHANES]    Frequency of Communication with Friends and Family: Three times a week    Frequency of Social Gatherings with Friends and Family: More than three times a week    Attends Religious Services: More than 4 times  per year    Active Member of Clubs or Organizations: No    Attends Archivist Meetings: Never    Marital Status: Widowed    Tobacco Counseling Counseling given: Not Answered   Clinical Intake:  Pre-visit preparation completed: Yes  Pain : No/denies pain     Nutritional Risks: None Diabetes: No  How often do you need to have someone help you when you read instructions, pamphlets, or other written materials from your doctor or pharmacy?: 1 - Never  Diabetic?no  Interpreter Needed?: No  Information entered by :: Kirke Shaggy, LPN   Activities of Daily Living    11/25/2022    1:39 PM  In your present state of health, do you have any difficulty performing the following activities:  Hearing? 0  Vision? 0  Difficulty concentrating or making decisions? 1  Walking or climbing stairs? 1  Dressing or bathing? 1  Doing errands, shopping? 1  Preparing Food and eating ? Y  Using the Toilet? Y  In the past six months, have you accidently leaked urine? N  Do you have problems with loss of bowel control? N  Managing your Medications? Y  Managing your Finances? Y  Housekeeping or managing your Housekeeping? Y    Patient Care Team: Tonia Ghent, MD as PCP - General (Family Medicine) Jettie Booze, MD as PCP - Cardiology (Cardiology) Idelle Leech, OD (Optometry)  Indicate any recent Medical Services you may have received from other than [facility] providers in the past year (date may be approximate).     Assessment:   This is a routine wellness examination for Takerra.  Hearing/Vision screen Hearing Screening - Comments:: No aids Vision Screening - Comments:: Wears glasses- Dr.Nice  Dietary issues and exercise activities discussed: Current Exercise Habits: Home exercise routine, Type of exercise: stretching;Other - see comments (P.T.), Time (Minutes): 30, Frequency (Times/Week): 4, Weekly Exercise (Minutes/Week): 120, Intensity: Mild   Goals Addressed             This Visit's Progress    DIET - EAT MORE FRUITS AND VEGETABLES         Depression Screen    11/25/2022    1:35 PM 02/15/2022    2:42 PM 12/05/2020   12:37 PM  PHQ 2/9 Scores  PHQ - 2 Score 0 0 0  PHQ- 9 Score 0  5    Fall Risk    11/25/2022    1:38 PM 10/04/2022    1:31 PM 02/18/2022   11:22 AM 01/13/2022    1:39 PM 12/05/2020   11:41 AM  [place] in the past year? 0 1 1 1 1   Number falls in past yr: 1 0 1 1 1   Injury with Fall? 0 1 1 0 0  Risk for fall due to : History of fall(s);Mental status change    History of fall(s);Impaired mobility  Follow up Falls prevention discussed;Falls evaluation completed Falls evaluation completed   Falls evaluation completed    FALL RISK PREVENTION PERTAINING TO THE HOME:  Any stairs in or around the home? No  If so, are there any without handrails? No  Home free of loose throw rugs in walkways, pet beds, electrical cords, etc? Yes  Adequate lighting in your home to reduce risk of falls? Yes   ASSISTIVE DEVICES UTILIZED TO PREVENT FALLS:  Life alert? No  Use of a cane, walker or w/c? Yes  Grab bars in the bathroom? No  Shower  chair or bench in shower? Yes  Elevated toilet seat or a handicapped toilet? Yes    Cognitive Function:      01/14/2022    8:00  AM  Montreal Cognitive Assessment   Visuospatial/ Executive (0/5) 1  Naming (0/3) 2  Attention: Read list of digits (0/2) 1  Attention: Read list of letters (0/1) 1  Attention: Serial 7 subtraction starting at 100 (0/3) 1  Language: Repeat phrase (0/2) 2  Language : Fluency (0/1) 0  Abstraction (0/2) 1  Delayed Recall (0/5) 2  Orientation (0/6) 4  Total 15  Adjusted Score (based on education) 15      11/25/2022    1:45 PM  6CIT Screen  What Year? 0 points  What month? 0 points  What time? 0 points  Count back from 20 0 points  Months in reverse 0 points  Repeat phrase 2 points  Total Score 2 points    Immunizations Immunization History  Administered Date(s) Administered   Fluad Quad(high Dose 65+) 09/01/2021, 07/27/2022   Influenza-Unspecified 07/02/2020   PFIZER(Purple Top)SARS-COV-2 Vaccination 12/18/2019, 01/08/2020, 08/13/2020   Pneumococcal Conjugate-13 11/01/2018   Pneumococcal Polysaccharide-23 11/01/2017   Zoster Recombinat (Shingrix) 11/01/2018, 12/31/2018    TDAP status: Due, Education has been provided regarding the importance of this vaccine. Advised may receive this vaccine at local pharmacy or Health Dept. Aware to provide a copy of the vaccination record if obtained from local pharmacy or Health Dept. Verbalized acceptance and understanding.  Flu Vaccine status: Up to date  Pneumococcal vaccine status: Up to date  Covid-19 vaccine status: Completed vaccines  Qualifies for Shingles Vaccine? Yes   Zostavax completed No   Shingrix Completed?: Yes  Screening Tests Health Maintenance  Topic Date Due   DTaP/Tdap/Td (1 - Tdap) Never done   DEXA SCAN  Never done   COVID-19 Vaccine (4 - 2023-24 season) 07/02/2022   Medicare Annual Wellness (AWV)  11/26/2023   Pneumonia Vaccine 18+ Years old  Completed   INFLUENZA VACCINE  Completed   Zoster Vaccines- Shingrix  Completed   HPV VACCINES  Aged Out    Health Maintenance  Health Maintenance Due   Topic Date Due   DTaP/Tdap/Td (1 - Tdap) Never done   DEXA SCAN  Never done   COVID-19 Vaccine (4 - 2023-24 season) 07/02/2022    Colorectal cancer screening: No longer required.   Mammogram status: No longer required due to age- had one 05/24/22.  Declined [facility] referral  Lung Cancer Screening: (Low Dose CT Chest recommended if Age 24-80 years, 30 pack-year currently smoking OR have quit w/in 15years.) does not qualify.   Additional Screening:  Hepatitis C Screening: does not qualify; Completed no  Vision Screening: Recommended annual ophthalmology exams for early detection of glaucoma and other disorders of the eye. Is the patient up to date with their annual eye exam?  Yes  Who is the provider or what is the name of the office in which the patient attends annual eye exams? Dr.Nice If pt is not established with a provider, would they like to be referred to a provider to establish care? No .   Dental Screening: Recommended annual dental exams for proper oral hygiene  Community Resource Referral / Chronic Care Management: CRR required this visit?  No   CCM required this visit?  No      Plan:     I have personally reviewed and noted the following in the patient's chart:   Medical and social history Use  of alcohol, tobacco or illicit drugs  Current medications and supplements including opioid prescriptions. Patient is not currently taking opioid prescriptions. Functional ability and status Nutritional status Physical activity Advanced directives List of other physicians Hospitalizations, surgeries, and ER visits in previous 12 months Vitals Screenings to include cognitive, depression, and falls Referrals and appointments  In addition, I have reviewed and discussed with patient certain preventive protocols, quality metrics, and best practice recommendations. A written personalized care plan for preventive services as well as general preventive health recommendations were  provided to patient.     Dionisio David, LPN   2/99/2426   Nurse Notes: none

## 2022-11-26 ENCOUNTER — Encounter: Payer: Self-pay | Admitting: Family Medicine

## 2022-11-26 ENCOUNTER — Ambulatory Visit (INDEPENDENT_AMBULATORY_CARE_PROVIDER_SITE_OTHER): Payer: Medicare Other | Admitting: Family Medicine

## 2022-11-26 VITALS — BP 120/70 | HR 65 | Temp 97.2°F | Ht 64.5 in | Wt 182.0 lb

## 2022-11-26 DIAGNOSIS — E039 Hypothyroidism, unspecified: Secondary | ICD-10-CM

## 2022-11-26 DIAGNOSIS — Z7189 Other specified counseling: Secondary | ICD-10-CM

## 2022-11-26 DIAGNOSIS — R519 Headache, unspecified: Secondary | ICD-10-CM | POA: Diagnosis not present

## 2022-11-26 DIAGNOSIS — E785 Hyperlipidemia, unspecified: Secondary | ICD-10-CM | POA: Diagnosis not present

## 2022-11-26 DIAGNOSIS — K5900 Constipation, unspecified: Secondary | ICD-10-CM

## 2022-11-26 DIAGNOSIS — I482 Chronic atrial fibrillation, unspecified: Secondary | ICD-10-CM

## 2022-11-26 DIAGNOSIS — Z0000 Encounter for general adult medical examination without abnormal findings: Secondary | ICD-10-CM

## 2022-11-26 NOTE — Progress Notes (Unsigned)
A fib: No recent metoprolol use or needed.  Has been taking lasix 1 tab daily.   Using medication without problems or lightheadedness: yes Chest pain with exertion:no Edema:some ankle edema, occ.   Short of breath: no Anticoagulated.  No bleeding.  History of constipation discussed.  Discussed using MiraLAX more frequently.  Hypothyroidism.  No neck mass.  Compliant.  TSH wnl.    Neck stretches helped with occipital HA.  Doing well now.    Elevated Cholesterol: Using medications without problems:yes Muscle aches: no Diet compliance: d/w pt.  Exercise: d/w pt.   Reasonable to continue statin as is.    Daughter Berline Chough designated if patient were incapacitated. Vaccines d/w pt.  Colon screening not due.  DXA and mammogram screening d/w pt.  Mammogram 2023.  Would defer DXA at this point given patient preference.    Ophthalmic migraines w/o HA, early AM.  Sees the same mirage each time.  Not bothered by episodes.  She had them for decades at baseline but more frequently recently.  She just got new glassed that may help with horizontal double vision.   Meds, vitals, and allergies reviewed.   ROS: Per HPI unless specifically indicated in ROS section   GEN: nad, alert and oriented HEENT: mucous membranes moist NECK: supple w/o LA CV: RRR with occ ectopy  PULM: ctab, no inc wob ABD: soft, +bs EXT: no edema SKIN: well perfused.   Minimal chronic toenail changes w/o pitting.

## 2022-11-26 NOTE — Patient Instructions (Addendum)
Let me and the eye clinic know if the new glasses don't help with double vision.  I don't know if prism would help, but please check on that.   Take care.  Glad to see you. Try miralax daily if needed.  Update me as needed.

## 2022-11-28 DIAGNOSIS — K5900 Constipation, unspecified: Secondary | ICD-10-CM | POA: Insufficient documentation

## 2022-11-28 DIAGNOSIS — R6883 Chills (without fever): Secondary | ICD-10-CM | POA: Diagnosis not present

## 2022-11-28 DIAGNOSIS — Z0000 Encounter for general adult medical examination without abnormal findings: Secondary | ICD-10-CM | POA: Insufficient documentation

## 2022-11-28 DIAGNOSIS — N3001 Acute cystitis with hematuria: Secondary | ICD-10-CM | POA: Diagnosis not present

## 2022-11-28 NOTE — Assessment & Plan Note (Signed)
Daughter Mallory Estrada designated if patient were incapacitated.

## 2022-11-28 NOTE — Assessment & Plan Note (Signed)
Continue warfarin at baseline with as needed metoprolol use.

## 2022-11-28 NOTE — Assessment & Plan Note (Signed)
Daughter Berline Chough designated if patient were incapacitated. Vaccines d/w pt.  Colon screening not due.  DXA and mammogram screening d/w pt.  Mammogram 2023.  Would defer DXA at this point given patient preference.

## 2022-11-28 NOTE — Assessment & Plan Note (Signed)
Continue atorvastatin

## 2022-11-28 NOTE — Assessment & Plan Note (Signed)
History of constipation discussed.  Discussed using MiraLAX more frequently.  Can use up to daily if needed.

## 2022-11-28 NOTE — Assessment & Plan Note (Signed)
Continue levothyroxine as is. 

## 2022-11-28 NOTE — Assessment & Plan Note (Signed)
Neck stretches helped with occipital headache.  She also has a longstanding history of occipital migraines where she sees [facility].  She is not bothered by the episodes and she has had them for decades at baseline.  She also has a history of horizontal diplopia.  She just recently got new glasses.  I think it makes sense to see if those help with any of her symptoms in the meantime.  It is unclear to me if she would improve with prism.  If she does not have that on the new glasses then she can check with the eye clinic about that.  See after visit summary.

## 2022-11-29 ENCOUNTER — Telehealth: Payer: Self-pay

## 2022-11-29 NOTE — Telephone Encounter (Signed)
[name] - Client TELEPHONE ADVICE RECORD [facility] Patient Name: Mallory Estrada Gender: Female DOB: 1941-02-07 Age: 82 Y 9 M 12 D Return Phone Number: [phone] (Primary) Address: City/ State/ Zip: [place]  [address] Client [place] Night - Client Client Site [place] Provider Renford Dills - MD Contact Type Call Who Is Calling Patient / Member / Family / Caregiver Call Type Triage / Clinical Caller Name Truett Mainland Relationship To Patient Daughter Return Phone Number [phone] (Primary) Chief Complaint Urination Pain Reason for Call Symptomatic / Request for Bath states, mom has a UTI, needs an antibiotic called in. UTI started last night. Translation No Nurse Assessment Nurse: Ysidro Evert, RN, Levada Dy Date/Time (Eastern Time): 11/28/2022 9:22:00 AM Confirm and document reason for call. If symptomatic, describe symptoms. ---Caller states she is having urgency with urination. Symptoms started last night Does the patient have any new or worsening symptoms? ---Yes Will a triage be completed? ---Yes Related visit to physician within the last 2 weeks? ---No Does the PT have any chronic conditions? (i.e. diabetes, asthma, this includes High risk factors for pregnancy, etc.) ---Yes List chronic conditions. ---chf, a-fib Is this a behavioral health or substance abuse call? ---No Guidelines Guideline Title Affirmed Question Affirmed Notes Nurse Date/Time Eilene Ghazi Time) Urinary Symptoms Urinating more frequently than usual (i.e., frequency) Ysidro Evert, RN, Levada Dy 11/28/2022 9:23:36 AM Disp. Time Eilene Ghazi Time) Disposition Final User 11/28/2022 9:27:46 AM See PCP within 24 Hours Yes Ysidro Evert, RN, Levada Dy Final Disposition 11/28/2022 9:27:46 AM See PCP within 24 Hours Yes Ysidro Evert, RN, Levada Dy PLEASE NOTE: All timestamps contained within this  report are represented as [place] Standard Time. CONFIDENTIALTY NOTICE: This fax transmission is intended only for the addressee. It contains information that is legally privileged, confidential or otherwise protected from use or disclosure. If you are not the intended recipient, you are strictly prohibited from reviewing, disclosing, copying using or disseminating any of this information or taking any action in reliance on or regarding this information. If you have received this fax in error, please notify us immediately by telephone so that we can arrange for its return to [place]. Phone: [phone], Toll-Free: [phone], Fax: [phone] Page: 2 of 2 Call Id: [id number] [place] Disagree/Comply Comply Caller Understands Yes PreDisposition Did not know what to do Care Advice Given Per Guideline SEE PCP WITHIN 24 HOURS: * IF OFFICE WILL BE OPEN: You need to be examined within the next 24 hours. Call your doctor (or NP/PA) when the office opens and make an appointment. DRINK PLENTY OF LIQUIDS: * Drink plenty of liquids. It is important to stay well-hydrated. CALL BACK IF: * Fever occurs * Unable to urinate and bladder feels full * You become worse CARE ADVICE given per Urinary Symptoms (Adult) guideline. Referrals REFERRED TO PCP OFFIC

## 2022-11-29 NOTE — Telephone Encounter (Signed)
Per chart review tab pt was seen at [facility] on 11/28/22.pt was given rx for keflex. Sending note to Dr Damita Dunnings.

## 2022-12-01 DIAGNOSIS — F02A0 Dementia in other diseases classified elsewhere, mild, without behavioral disturbance, psychotic disturbance, mood disturbance, and anxiety: Secondary | ICD-10-CM | POA: Diagnosis not present

## 2022-12-01 DIAGNOSIS — R1312 Dysphagia, oropharyngeal phase: Secondary | ICD-10-CM | POA: Diagnosis not present

## 2022-12-01 DIAGNOSIS — G20A1 Parkinson's disease without dyskinesia, without mention of fluctuations: Secondary | ICD-10-CM | POA: Diagnosis not present

## 2022-12-01 DIAGNOSIS — F32A Depression, unspecified: Secondary | ICD-10-CM | POA: Diagnosis not present

## 2022-12-01 DIAGNOSIS — Z7901 Long term (current) use of anticoagulants: Secondary | ICD-10-CM | POA: Diagnosis not present

## 2022-12-01 DIAGNOSIS — I4891 Unspecified atrial fibrillation: Secondary | ICD-10-CM | POA: Diagnosis not present

## 2022-12-01 DIAGNOSIS — E039 Hypothyroidism, unspecified: Secondary | ICD-10-CM | POA: Diagnosis not present

## 2022-12-01 DIAGNOSIS — M545 Low back pain, unspecified: Secondary | ICD-10-CM | POA: Diagnosis not present

## 2022-12-01 DIAGNOSIS — E785 Hyperlipidemia, unspecified: Secondary | ICD-10-CM | POA: Diagnosis not present

## 2022-12-01 DIAGNOSIS — M1990 Unspecified osteoarthritis, unspecified site: Secondary | ICD-10-CM | POA: Diagnosis not present

## 2022-12-01 DIAGNOSIS — Z9181 History of falling: Secondary | ICD-10-CM | POA: Diagnosis not present

## 2022-12-01 DIAGNOSIS — G43B0 Ophthalmoplegic migraine, not intractable: Secondary | ICD-10-CM | POA: Diagnosis not present

## 2022-12-01 NOTE — Telephone Encounter (Signed)
Spoke with patients daughter Berline Chough. She stated that her mom feels a little better; abx is taking longer then usual to work. She will finish the abx on Tuesday. Berline Chough also has been giving her azo at night too.

## 2022-12-01 NOTE — Telephone Encounter (Signed)
Noted.  Thanks.  Agree with the plan.

## 2022-12-01 NOTE — Telephone Encounter (Signed)
Please get update on patient.  I do not see the urine culture report in the chart yet.  Thanks.

## 2022-12-02 ENCOUNTER — Ambulatory Visit (INDEPENDENT_AMBULATORY_CARE_PROVIDER_SITE_OTHER): Payer: Medicare Other

## 2022-12-02 DIAGNOSIS — Z7901 Long term (current) use of anticoagulants: Secondary | ICD-10-CM

## 2022-12-02 LAB — POCT INR: INR: 2.5 (ref 2.0–3.0)

## 2022-12-02 NOTE — Patient Instructions (Addendum)
Pre visit review using our clinic review tool, if applicable. No additional management support is needed unless otherwise documented below in the visit note.  Continue to take 2 tablets (4 mg total) daily except take 1 tablet on Mondays. Recheck in one week.

## 2022-12-02 NOTE — Progress Notes (Signed)
Pt tests at home with help from her daughter, Berline Chough. Result of 2.5 today obtained through mdINR portal.   Continue to take 2 tablets (4 mg total) daily except take 1 tablet on Mondays. Recheck in one week. [name] and pt check mychart when pt is in range and continue normal dosing.

## 2022-12-06 ENCOUNTER — Telehealth: Payer: Self-pay | Admitting: Family Medicine

## 2022-12-06 NOTE — Telephone Encounter (Signed)
Patient was seen at [facility] on 11/28/22 and they put her on antibiotics,but she's still not better. Daughter stated that [facility] will be sending her lab results over from 11/28/22 for Dr Damita Dunnings to look over. Daughter stated that she may think she needs to be on the antibiotic a little longer than 10 days. She seen michelle at [facility]  and she wanted to leave her number with DR Damita Dunnings if he have any questions,her number is [phone]

## 2022-12-07 ENCOUNTER — Encounter (HOSPITAL_COMMUNITY)
Admission: RE | Admit: 2022-12-07 | Discharge: 2022-12-07 | Disposition: A | Discharge status: Home / Self Care | Payer: Medicare Other | Source: Ambulatory Visit | Attending: Physician Assistant | Admitting: Physician Assistant

## 2022-12-07 ENCOUNTER — Telehealth: Payer: Self-pay | Admitting: Family Medicine

## 2022-12-07 DIAGNOSIS — G20A1 Parkinson's disease without dyskinesia, without mention of fluctuations: Secondary | ICD-10-CM | POA: Diagnosis not present

## 2022-12-07 MED ORDER — IOFLUPANE I 123 185 MBQ/2.5ML IV SOLN
4.2000 | Freq: Once | INTRAVENOUS | Status: AC | PRN
  Administered 2022-12-07: 4.2 via INTRAVENOUS

## 2022-12-07 MED ORDER — POTASSIUM IODIDE (ANTIDOTE) 130 MG PO TABS
ORAL_TABLET | ORAL | Status: AC
  Filled 2022-12-07: qty 1

## 2022-12-07 MED ORDER — CEPHALEXIN 500 MG PO CAPS: 500.0000 mg | ORAL_CAPSULE | Freq: Three times a day (TID) | ORAL | 0 refills | Status: DC

## 2022-12-07 NOTE — Telephone Encounter (Signed)
Home Health verbal orders Caller Name: Shelby Name: [facility]  Callback number: [phone]  Requesting PT  Reason: extend  Frequency: 1x a week for this week, effective today  Please forward to [facility] pool or providers CMA

## 2022-12-07 NOTE — Telephone Encounter (Signed)
Patient daughter Berline Chough called in and stated that this medication was sent over to wrong the pharmacy. The medication should be sent over to [place], [place] AT [facility] Thank you!

## 2022-12-07 NOTE — Telephone Encounter (Signed)
Spoke with patient and her daughter about results and abx being resent to correct pharmacy. Advised to let us know if no better at end of abx.

## 2022-12-07 NOTE — Telephone Encounter (Signed)
Ucx with E coli sens to keflex.  That abx should cover it.  If not improved, would change to TID dosing, new rx sent with same med to [facility].  Update [place] if not better at the end of the rx.  Thanks.

## 2022-12-07 NOTE — Addendum Note (Signed)
Addended by: Tonia Ghent on: 12/07/2022 02:05 PM   Modules accepted: Orders

## 2022-12-07 NOTE — Addendum Note (Signed)
Addended by: Sherrilee Gilles B on: 12/07/2022 03:03 PM   Modules accepted: Orders

## 2022-12-07 NOTE — Telephone Encounter (Signed)
LMTCB

## 2022-12-07 NOTE — Telephone Encounter (Signed)
Please give the order.  Thanks.   

## 2022-12-07 NOTE — Telephone Encounter (Signed)
Rx re-sent.  

## 2022-12-07 NOTE — Telephone Encounter (Signed)
Patient daughter called back in regarding this,her mom will take her last pill tonight,and she is wondering have Dr Damita Dunnings received those labs yet?

## 2022-12-08 DIAGNOSIS — G20A1 Parkinson's disease without dyskinesia, without mention of fluctuations: Secondary | ICD-10-CM | POA: Diagnosis not present

## 2022-12-08 DIAGNOSIS — R1312 Dysphagia, oropharyngeal phase: Secondary | ICD-10-CM | POA: Diagnosis not present

## 2022-12-08 DIAGNOSIS — Z7901 Long term (current) use of anticoagulants: Secondary | ICD-10-CM | POA: Diagnosis not present

## 2022-12-08 DIAGNOSIS — G43B0 Ophthalmoplegic migraine, not intractable: Secondary | ICD-10-CM | POA: Diagnosis not present

## 2022-12-08 DIAGNOSIS — F32A Depression, unspecified: Secondary | ICD-10-CM | POA: Diagnosis not present

## 2022-12-08 DIAGNOSIS — E785 Hyperlipidemia, unspecified: Secondary | ICD-10-CM | POA: Diagnosis not present

## 2022-12-08 DIAGNOSIS — I4891 Unspecified atrial fibrillation: Secondary | ICD-10-CM | POA: Diagnosis not present

## 2022-12-08 DIAGNOSIS — M1990 Unspecified osteoarthritis, unspecified site: Secondary | ICD-10-CM | POA: Diagnosis not present

## 2022-12-08 DIAGNOSIS — Z9181 History of falling: Secondary | ICD-10-CM | POA: Diagnosis not present

## 2022-12-08 DIAGNOSIS — M545 Low back pain, unspecified: Secondary | ICD-10-CM | POA: Diagnosis not present

## 2022-12-08 DIAGNOSIS — F02A0 Dementia in other diseases classified elsewhere, mild, without behavioral disturbance, psychotic disturbance, mood disturbance, and anxiety: Secondary | ICD-10-CM | POA: Diagnosis not present

## 2022-12-08 DIAGNOSIS — E039 Hypothyroidism, unspecified: Secondary | ICD-10-CM | POA: Diagnosis not present

## 2022-12-08 NOTE — Telephone Encounter (Signed)
LMTCB

## 2022-12-09 ENCOUNTER — Telehealth: Payer: Self-pay | Admitting: Physician Assistant

## 2022-12-09 ENCOUNTER — Ambulatory Visit (INDEPENDENT_AMBULATORY_CARE_PROVIDER_SITE_OTHER): Payer: Medicare Other

## 2022-12-09 DIAGNOSIS — Z7901 Long term (current) use of anticoagulants: Secondary | ICD-10-CM | POA: Diagnosis not present

## 2022-12-09 LAB — POCT INR: INR: 2.8 (ref 2.0–3.0)

## 2022-12-09 NOTE — Telephone Encounter (Signed)
Patient had a DaT scan at [facility] on Tuesday they would like a call back with the results    Please  call

## 2022-12-09 NOTE — Telephone Encounter (Signed)
Notified not read yet, thanked me for calling

## 2022-12-09 NOTE — Progress Notes (Signed)
Pt tests at home with help from her daughter, Berline Chough. Result of 2.8 today obtained through mdINR portal.   Continue to take 2 tablets (4 mg total) daily except take 1 tablet on Mondays. Recheck in one week. [name] and pt check mychart when pt is in range and continue normal dosing.

## 2022-12-09 NOTE — Patient Instructions (Addendum)
Pre visit review using our clinic review tool, if applicable. No additional management support is needed unless otherwise documented below in the visit note.  Continue to take 2 tablets (4 mg total) daily except take 1 tablet on Mondays. Recheck in one week.  

## 2022-12-10 ENCOUNTER — Encounter: Payer: Self-pay | Admitting: Family Medicine

## 2022-12-10 NOTE — Telephone Encounter (Signed)
Verbal orders given to Tish at [facility].

## 2022-12-13 ENCOUNTER — Other Ambulatory Visit: Payer: Self-pay | Admitting: Family Medicine

## 2022-12-15 DIAGNOSIS — Z7901 Long term (current) use of anticoagulants: Secondary | ICD-10-CM | POA: Diagnosis not present

## 2022-12-15 DIAGNOSIS — Z792 Long term (current) use of antibiotics: Secondary | ICD-10-CM | POA: Diagnosis not present

## 2022-12-15 DIAGNOSIS — M545 Low back pain, unspecified: Secondary | ICD-10-CM | POA: Diagnosis not present

## 2022-12-15 DIAGNOSIS — G43B0 Ophthalmoplegic migraine, not intractable: Secondary | ICD-10-CM | POA: Diagnosis not present

## 2022-12-15 DIAGNOSIS — F02A3 Dementia in other diseases classified elsewhere, mild, with mood disturbance: Secondary | ICD-10-CM | POA: Diagnosis not present

## 2022-12-15 DIAGNOSIS — R1312 Dysphagia, oropharyngeal phase: Secondary | ICD-10-CM | POA: Diagnosis not present

## 2022-12-15 DIAGNOSIS — M1990 Unspecified osteoarthritis, unspecified site: Secondary | ICD-10-CM | POA: Diagnosis not present

## 2022-12-15 DIAGNOSIS — G20A1 Parkinson's disease without dyskinesia, without mention of fluctuations: Secondary | ICD-10-CM | POA: Diagnosis not present

## 2022-12-15 DIAGNOSIS — F32A Depression, unspecified: Secondary | ICD-10-CM | POA: Diagnosis not present

## 2022-12-15 DIAGNOSIS — Z9181 History of falling: Secondary | ICD-10-CM | POA: Diagnosis not present

## 2022-12-15 DIAGNOSIS — E785 Hyperlipidemia, unspecified: Secondary | ICD-10-CM | POA: Diagnosis not present

## 2022-12-15 DIAGNOSIS — I4891 Unspecified atrial fibrillation: Secondary | ICD-10-CM | POA: Diagnosis not present

## 2022-12-15 DIAGNOSIS — E039 Hypothyroidism, unspecified: Secondary | ICD-10-CM | POA: Diagnosis not present

## 2022-12-15 MED ORDER — GEMTESA 75 MG PO TABS: 75.0000 mg | ORAL_TABLET | Freq: Every day | ORAL | 1 refills | Status: DC

## 2022-12-15 NOTE — Telephone Encounter (Signed)
I sent the prescription for Gemtesa to mail order pharmacy.  It may end up needing a prior authorization.  Please see MyChart message.  Thanks.

## 2022-12-16 ENCOUNTER — Ambulatory Visit (INDEPENDENT_AMBULATORY_CARE_PROVIDER_SITE_OTHER): Payer: Medicare Other

## 2022-12-16 LAB — POCT INR: INR: 2.7 (ref 2.0–3.0)

## 2022-12-16 NOTE — Patient Instructions (Addendum)
Pre visit review using our clinic review tool, if applicable. No additional management support is needed unless otherwise documented below in the visit note.  Continue to take 2 tablets (4 mg total) daily except take 1 tablet on Mondays. Recheck in one week.

## 2022-12-16 NOTE — Progress Notes (Signed)
Pt tests at home with help from her daughter, Berline Chough. Result of 2.7 today obtained through [facility]INR portal.   Continue to take 2 tablets (4 mg total) daily except take 1 tablet on Mondays. Recheck in one week. [name] and pt check mychart when pt is in range and continue normal dosing.

## 2022-12-20 ENCOUNTER — Other Ambulatory Visit: Payer: Self-pay | Admitting: Family Medicine

## 2022-12-21 ENCOUNTER — Telehealth: Payer: Self-pay

## 2022-12-21 ENCOUNTER — Encounter: Payer: Self-pay | Admitting: Family Medicine

## 2022-12-21 NOTE — Telephone Encounter (Signed)
Received call from pt's daughter, Berline Chough, inquiring if pt can take lion's mane extract. She has a friend's father taking it for his dementia and they report it is helping a lot.   Advised this will increase the effects of warfarin, which increases the risk of bleeding. She is not aware of dosing , so advised to check how much the person is taking and how often. Advised this nurse will f/u with Dr. Damita Dunnings on Thursday when there for coumadin clinic. [name] verbalized understanding and will call back with the dosing amount the other person is taking.

## 2022-12-21 NOTE — Telephone Encounter (Signed)
Pt's daughter called in wanting to get the DaT scan results

## 2022-12-22 DIAGNOSIS — M1990 Unspecified osteoarthritis, unspecified site: Secondary | ICD-10-CM

## 2022-12-22 DIAGNOSIS — F32A Depression, unspecified: Secondary | ICD-10-CM

## 2022-12-22 DIAGNOSIS — G43B0 Ophthalmoplegic migraine, not intractable: Secondary | ICD-10-CM

## 2022-12-22 DIAGNOSIS — R1312 Dysphagia, oropharyngeal phase: Secondary | ICD-10-CM

## 2022-12-22 DIAGNOSIS — F02A3 Dementia in other diseases classified elsewhere, mild, with mood disturbance: Secondary | ICD-10-CM

## 2022-12-22 DIAGNOSIS — G20A1 Parkinson's disease without dyskinesia, without mention of fluctuations: Secondary | ICD-10-CM

## 2022-12-22 DIAGNOSIS — Z7901 Long term (current) use of anticoagulants: Secondary | ICD-10-CM

## 2022-12-22 DIAGNOSIS — Z792 Long term (current) use of antibiotics: Secondary | ICD-10-CM

## 2022-12-22 DIAGNOSIS — E039 Hypothyroidism, unspecified: Secondary | ICD-10-CM

## 2022-12-22 DIAGNOSIS — I4891 Unspecified atrial fibrillation: Secondary | ICD-10-CM

## 2022-12-22 DIAGNOSIS — M545 Low back pain, unspecified: Secondary | ICD-10-CM

## 2022-12-22 DIAGNOSIS — E785 Hyperlipidemia, unspecified: Secondary | ICD-10-CM

## 2022-12-22 DIAGNOSIS — Z9181 History of falling: Secondary | ICD-10-CM

## 2022-12-22 MED ORDER — TRAZODONE HCL 50 MG PO TABS: 25.0000 mg | ORAL_TABLET | Freq: Every evening | ORAL | 3 refills | Status: DC | PRN

## 2022-12-22 NOTE — Telephone Encounter (Signed)
Test results are not read at this time.Will call patient this am.

## 2022-12-23 ENCOUNTER — Ambulatory Visit (INDEPENDENT_AMBULATORY_CARE_PROVIDER_SITE_OTHER): Payer: Medicare Other

## 2022-12-23 DIAGNOSIS — Z7901 Long term (current) use of anticoagulants: Secondary | ICD-10-CM

## 2022-12-23 LAB — POCT INR: INR: 2.6 (ref 2.0–3.0)

## 2022-12-23 NOTE — Progress Notes (Signed)
Pt tests at home with help from her daughter, Berline Chough. Result of 2.6 today obtained through [facility]INR portal.  Pt starting trazadone to help with sleep. Continue to take 2 tablets (4 mg total) daily except take 1 tablet on Mondays. Recheck in one week. [name] and pt check mychart when pt is in range and continue normal dosing.

## 2022-12-23 NOTE — Patient Instructions (Signed)
Pre visit review using our clinic review tool, if applicable. No additional management support is needed unless otherwise documented below in the visit note. 

## 2022-12-23 NOTE — Telephone Encounter (Signed)
Discussed with PCP and do not think pt should use lion's mane extract due to bleeding risk.   Contacted pt's daughter and advised. [name] verbalized understanding.

## 2022-12-29 ENCOUNTER — Other Ambulatory Visit (HOSPITAL_COMMUNITY)
Admission: RE | Admit: 2022-12-29 | Discharge: 2022-12-29 | Disposition: A | Discharge status: Home / Self Care | Payer: Medicare Other | Attending: Obstetrics and Gynecology | Admitting: Obstetrics and Gynecology

## 2022-12-29 ENCOUNTER — Ambulatory Visit (INDEPENDENT_AMBULATORY_CARE_PROVIDER_SITE_OTHER): Payer: Medicare Other

## 2022-12-29 DIAGNOSIS — R82998 Other abnormal findings in urine: Secondary | ICD-10-CM

## 2022-12-29 DIAGNOSIS — R319 Hematuria, unspecified: Secondary | ICD-10-CM | POA: Diagnosis not present

## 2022-12-29 DIAGNOSIS — R350 Frequency of micturition: Secondary | ICD-10-CM | POA: Diagnosis not present

## 2022-12-29 LAB — URINALYSIS, ROUTINE W REFLEX MICROSCOPIC
Bilirubin Urine: NEGATIVE
Glucose, UA: NEGATIVE mg/dL
Ketones, ur: NEGATIVE mg/dL
Nitrite: POSITIVE — AB
Protein, ur: 30 mg/dL — AB
Specific Gravity, Urine: 1.019 (ref 1.005–1.030)
WBC, UA: >50 WBC/hpf (ref 0–5)
pH: 5 (ref 5.0–8.0)

## 2022-12-29 LAB — POCT URINALYSIS DIPSTICK
Bilirubin, UA: NEGATIVE
Glucose, UA: NEGATIVE
Ketones, UA: NEGATIVE
Nitrite, UA: POSITIVE
Protein, UA: POSITIVE — AB
Spec Grav, UA: 1.025 (ref 1.010–1.025)
Urobilinogen, UA: 0.2 E.U./dL
pH, UA: 5 (ref 5.0–8.0)

## 2022-12-29 MED ORDER — CEFUROXIME AXETIL 500 MG PO TABS: 500.0000 mg | ORAL_TABLET | Freq: Two times a day (BID) | ORAL | 0 refills | Status: AC

## 2022-12-29 NOTE — Progress Notes (Signed)
Mallory Estrada is a 82 y.o. female arrived today with UTI sx.  Per Dr. Tommas Olp protocol: A urine specimen was collected and POCT Urine was done and urine culture sent to the lab. POCT Urine was positive  Pt was notified and prescription sent to the preferred pharmacy.

## 2022-12-29 NOTE — Patient Instructions (Signed)
Your Urine dip that was done in office was POSITIVE. I am sending the urine off for culture and you can take AZO over the counter for your discomfort.  We have also ordered Bactrim for you to take while we wait for your culture results, hopefully this gives you some relief. We will contact you when the results are back between 3-5 days. If a different antibiotic is needed we will sent the order to the pharmacy and you will be notified. If you have any questions or concerns please feel free to call us at [phone]

## 2022-12-30 ENCOUNTER — Encounter: Payer: Medicare Other | Admitting: Neurology

## 2022-12-30 ENCOUNTER — Other Ambulatory Visit: Payer: Self-pay | Admitting: Family Medicine

## 2022-12-30 ENCOUNTER — Ambulatory Visit (INDEPENDENT_AMBULATORY_CARE_PROVIDER_SITE_OTHER): Payer: Medicare Other

## 2022-12-30 ENCOUNTER — Encounter: Payer: Self-pay | Admitting: Family Medicine

## 2022-12-30 DIAGNOSIS — Z7901 Long term (current) use of anticoagulants: Secondary | ICD-10-CM

## 2022-12-30 LAB — POCT INR: INR: 3.2 — AB (ref 2.0–3.0)

## 2022-12-30 MED ORDER — SERTRALINE HCL 25 MG PO TABS: 25.0000 mg | ORAL_TABLET | Freq: Every day | ORAL | 3 refills | Status: DC

## 2022-12-30 NOTE — Patient Instructions (Addendum)
Pre visit review using our clinic review tool, if applicable. No additional management support is needed unless otherwise documented below in the visit note.  Reduce dose today to take 1 tablet and then continue to take 2 tablets (4 mg total) daily except take 1 tablet on Mondays. Recheck in one week.

## 2022-12-30 NOTE — Progress Notes (Signed)
Pt tests at home with help from her daughter, Berline Chough. Result of 3.2 today obtained through [facility]INR portal.  Pt will start sertraline tonight and has stopped taking trazodone. Reduce dose today to take 1 tablet and then continue to take 2 tablets (4 mg total) daily except take 1 tablet on Mondays. Recheck in one week. Contacted [name] and advised of dosing change. [name] verbalized understanding.

## 2022-12-31 DIAGNOSIS — G43B0 Ophthalmoplegic migraine, not intractable: Secondary | ICD-10-CM | POA: Diagnosis not present

## 2022-12-31 DIAGNOSIS — Z792 Long term (current) use of antibiotics: Secondary | ICD-10-CM | POA: Diagnosis not present

## 2022-12-31 DIAGNOSIS — F32A Depression, unspecified: Secondary | ICD-10-CM | POA: Diagnosis not present

## 2022-12-31 DIAGNOSIS — Z7901 Long term (current) use of anticoagulants: Secondary | ICD-10-CM | POA: Diagnosis not present

## 2022-12-31 DIAGNOSIS — G20A1 Parkinson's disease without dyskinesia, without mention of fluctuations: Secondary | ICD-10-CM | POA: Diagnosis not present

## 2022-12-31 DIAGNOSIS — I4891 Unspecified atrial fibrillation: Secondary | ICD-10-CM | POA: Diagnosis not present

## 2022-12-31 DIAGNOSIS — G20C Parkinsonism, unspecified: Secondary | ICD-10-CM | POA: Diagnosis not present

## 2022-12-31 DIAGNOSIS — R413 Other amnesia: Secondary | ICD-10-CM | POA: Diagnosis not present

## 2022-12-31 DIAGNOSIS — R1312 Dysphagia, oropharyngeal phase: Secondary | ICD-10-CM | POA: Diagnosis not present

## 2022-12-31 DIAGNOSIS — E039 Hypothyroidism, unspecified: Secondary | ICD-10-CM | POA: Diagnosis not present

## 2022-12-31 DIAGNOSIS — E785 Hyperlipidemia, unspecified: Secondary | ICD-10-CM | POA: Diagnosis not present

## 2022-12-31 DIAGNOSIS — Z9181 History of falling: Secondary | ICD-10-CM | POA: Diagnosis not present

## 2022-12-31 DIAGNOSIS — M1990 Unspecified osteoarthritis, unspecified site: Secondary | ICD-10-CM | POA: Diagnosis not present

## 2022-12-31 DIAGNOSIS — M545 Low back pain, unspecified: Secondary | ICD-10-CM | POA: Diagnosis not present

## 2022-12-31 DIAGNOSIS — F02A3 Dementia in other diseases classified elsewhere, mild, with mood disturbance: Secondary | ICD-10-CM | POA: Diagnosis not present

## 2022-12-31 NOTE — Progress Notes (Signed)
DAT scan is normal, no signs of Parkinson's disease. Thank s

## 2023-01-01 ENCOUNTER — Encounter: Payer: Self-pay | Admitting: Obstetrics and Gynecology

## 2023-01-01 LAB — URINE CULTURE: Culture: >=100000 — AB

## 2023-01-04 DIAGNOSIS — K080 Exfoliation of teeth due to systemic causes: Secondary | ICD-10-CM | POA: Diagnosis not present

## 2023-01-05 DIAGNOSIS — I4891 Unspecified atrial fibrillation: Secondary | ICD-10-CM | POA: Diagnosis not present

## 2023-01-05 DIAGNOSIS — E785 Hyperlipidemia, unspecified: Secondary | ICD-10-CM | POA: Diagnosis not present

## 2023-01-05 DIAGNOSIS — M1990 Unspecified osteoarthritis, unspecified site: Secondary | ICD-10-CM | POA: Diagnosis not present

## 2023-01-05 DIAGNOSIS — G20A1 Parkinson's disease without dyskinesia, without mention of fluctuations: Secondary | ICD-10-CM | POA: Diagnosis not present

## 2023-01-05 DIAGNOSIS — E039 Hypothyroidism, unspecified: Secondary | ICD-10-CM | POA: Diagnosis not present

## 2023-01-05 DIAGNOSIS — F02A3 Dementia in other diseases classified elsewhere, mild, with mood disturbance: Secondary | ICD-10-CM | POA: Diagnosis not present

## 2023-01-05 DIAGNOSIS — Z7901 Long term (current) use of anticoagulants: Secondary | ICD-10-CM | POA: Diagnosis not present

## 2023-01-05 DIAGNOSIS — F32A Depression, unspecified: Secondary | ICD-10-CM | POA: Diagnosis not present

## 2023-01-05 DIAGNOSIS — M545 Low back pain, unspecified: Secondary | ICD-10-CM | POA: Diagnosis not present

## 2023-01-05 DIAGNOSIS — G43B0 Ophthalmoplegic migraine, not intractable: Secondary | ICD-10-CM | POA: Diagnosis not present

## 2023-01-05 DIAGNOSIS — R1312 Dysphagia, oropharyngeal phase: Secondary | ICD-10-CM | POA: Diagnosis not present

## 2023-01-05 DIAGNOSIS — Z792 Long term (current) use of antibiotics: Secondary | ICD-10-CM | POA: Diagnosis not present

## 2023-01-05 DIAGNOSIS — Z9181 History of falling: Secondary | ICD-10-CM | POA: Diagnosis not present

## 2023-01-06 ENCOUNTER — Ambulatory Visit (INDEPENDENT_AMBULATORY_CARE_PROVIDER_SITE_OTHER): Payer: Medicare Other

## 2023-01-06 ENCOUNTER — Telehealth (INDEPENDENT_AMBULATORY_CARE_PROVIDER_SITE_OTHER): Payer: Medicare Other | Admitting: Obstetrics and Gynecology

## 2023-01-06 DIAGNOSIS — Z7901 Long term (current) use of anticoagulants: Secondary | ICD-10-CM

## 2023-01-06 DIAGNOSIS — N390 Urinary tract infection, site not specified: Secondary | ICD-10-CM | POA: Diagnosis not present

## 2023-01-06 LAB — POCT INR: INR: 1.8 — AB (ref 2.0–3.0)

## 2023-01-06 MED ORDER — FOSFOMYCIN TROMETHAMINE 3 G PO PACK: PACK | ORAL | 5 refills | Status: DC

## 2023-01-06 NOTE — Progress Notes (Signed)
[facility] Return Visit- Video visit  The patient consented to video visit. She was located at home in [place]. Provider was located in the office. Her daughter Berline Chough joined her for the call.   SUBJECTIVE  History of Present Illness: Mallory Estrada is a 82 y.o. female seen in follow-up for recurrent UTI.    Recently dropped off urine sample 12/29/22 and was positive for E. Coli. She was treated with keflex. She feels like her symptoms are gone since taking the antibiotic- last dose was yesterday. Has been unable to get the Estring for several months as it is on backorder. She and her daughter are not comfortable using the cream or tablet forms of estrogen. Patient not able to place herself, and daughter is not comfortable placing for her.   Bladder urgency has been well controlled. She has been taking the [facility] again, prescribed by PCP. She was able to get this covered by insurance. She is taking it before bed. This has been helping a lot at night. Has been able to decrease pad size.   Past Medical History: Patient  has a past medical history of Arthritis of finger of both hands (06/17/2021), Atrial fibrillation, Gait abnormality, H/O laminectomy (01/14/2022), Hip arthritis, Hyperlipidemia, Hypothyroidism, Insomnia (02/18/2022), Left-sided chest wall pain (10/13/2021), Mild dementia, unclear etiology (09/15/2022), Nonintractable headache (11/02/2021), Ophthalmoplegic migraine, Pulmonary nodule (06/09/2022), Recurrent UTI (03/26/2021), Refusal of blood transfusions as patient is Jehovah's Witness (12/29/2020), Shoulder pain (09/02/2021), SUI (stress urinary incontinence, female), and Toe pain (09/02/2021).   Past Surgical History: She  has a past surgical history that includes Breast biopsy; Tonsillectomy and adenoidectomy; Back surgery; Hip surgery (Bilateral); and Knee surgery (Bilateral).   Medications: She has a current medication list which includes the following prescription(s):  fosfomycin, acetaminophen, atorvastatin, cefuroxime, cephalexin, vitamin d3, cyclobenzaprine, doxylamine (sleep), estring, furosemide, klor-con m20, levothyroxine, loratadine, magnesium gluconate, metoprolol succinate, polyethylene glycol powder, sertraline, gemtesa, and warfarin.   Allergies: Patient is allergic to fentanyl, gabapentin, ketamine, macrobid [nitrofurantoin], memantine, sulfa antibiotics, and trazodone and nefazodone.   Social History: Patient  reports that she quit smoking about 52 years ago. Her smoking use included cigarettes. She has never used smokeless tobacco. She reports current alcohol use. She reports that she does not use drugs.      OBJECTIVE     Physical Exam:  Gen: No apparent distress, A&O x 3.    ASSESSMENT AND PLAN    Ms. Mutchler is a 82 y.o. with:  1. Recurrent UTI    - We discussed that the estrogen is the best option for her as this has been preventing UTIs. However since this is not available, will restart antibiotic prophylaxis. Last year responded well to the Fosfomycin 3g q10 days. Will restart this again until we can get the estring again.  - Once new estring is placed, then can overlap the estrogen and Fosfomycin by one month then discontinue antibiotic.   Will return when we have estring available for replacement.   Jaquita Folds, MD

## 2023-01-06 NOTE — Patient Instructions (Addendum)
Pre visit review using our clinic review tool, if applicable. No additional management support is needed unless otherwise documented below in the visit note.  Increase dose today to take 3 tablets (6 mg) and then continue to take 2 tablets (4 mg total) daily except take 1 tablet on Mondays. Recheck in one week.

## 2023-01-06 NOTE — Progress Notes (Addendum)
Pt tests at home with help from her daughter, Berline Chough. Result of 1.8 today obtained through mdINR portal.  Pt has been on Bactrim, Keflex, and Ceftin for UTI and has finished those yesterday. Pt has also started Gemtesa  Increase dose today to take 3 tablets (6 mg) and then continue to take 2 tablets (4 mg total) daily except take 1 tablet on Mondays. Recheck in one week. Contacted [name] and advised of dosing change. [name] verbalized understanding.

## 2023-01-10 ENCOUNTER — Telehealth: Payer: Self-pay

## 2023-01-10 ENCOUNTER — Other Ambulatory Visit: Payer: Self-pay

## 2023-01-10 ENCOUNTER — Other Ambulatory Visit (HOSPITAL_COMMUNITY)
Admission: RE | Admit: 2023-01-10 | Discharge: 2023-01-10 | Disposition: A | Discharge status: Home / Self Care | Payer: Medicare Other | Attending: Obstetrics and Gynecology | Admitting: Obstetrics and Gynecology

## 2023-01-10 ENCOUNTER — Encounter: Payer: Self-pay | Admitting: Family Medicine

## 2023-01-10 ENCOUNTER — Ambulatory Visit (INDEPENDENT_AMBULATORY_CARE_PROVIDER_SITE_OTHER): Payer: Medicare Other

## 2023-01-10 DIAGNOSIS — R82998 Other abnormal findings in urine: Secondary | ICD-10-CM

## 2023-01-10 DIAGNOSIS — R319 Hematuria, unspecified: Secondary | ICD-10-CM | POA: Insufficient documentation

## 2023-01-10 LAB — URINALYSIS, ROUTINE W REFLEX MICROSCOPIC
Bilirubin Urine: NEGATIVE
Glucose, UA: NEGATIVE mg/dL
Ketones, ur: NEGATIVE mg/dL
Leukocytes,Ua: NEGATIVE
Nitrite: NEGATIVE
Protein, ur: NEGATIVE mg/dL
Specific Gravity, Urine: 1.009 (ref 1.005–1.030)
pH: 5 (ref 5.0–8.0)

## 2023-01-10 NOTE — Telephone Encounter (Signed)
Pt's daughter, Berline Chough, LVM reporting she received a letter from [facility] reporting there was a denial of something that had to do with pt's anticoagulation/warfarin.

## 2023-01-10 NOTE — Telephone Encounter (Signed)
Contacted [name] and she uploaded the [place] document/denial to my chart. Will continue documentation in the mychart msg.

## 2023-01-10 NOTE — Patient Instructions (Signed)
Your Urine dip that was done in office was Positive . I am sending the urine off for culture. We will contact you when the results are back between 3-5 days. If needed, an antibiotic will be sent to the pharmacy and you will be notified. If you have any questions or concerns please feel free to call us at [phone]

## 2023-01-10 NOTE — Telephone Encounter (Signed)
Contacted Mallory Estrada and she uploaded the [place] document/denial to my chart.  Will forward information to billing for help with letter clarification. Unsure what the insurance is denying on encounter date of 11/25/22. Pt does test at home with own INR machine and [facility]in clinic manages the pt's warfarin.

## 2023-01-10 NOTE — Progress Notes (Signed)
Daughter of LANAYSHA ORGAN is a 82 y.o. female  arrived today with UTI sx.  Per Dr. Tommas Olp protocol: A urine specimen was collected and POCT Urine was done and urine culture sent to the lab. POCT Urine was Positive for Nitrates and small blood.  Chart will be sent to Dr. Wannetta Sender for review.

## 2023-01-11 LAB — URINE CULTURE: Culture: NO GROWTH

## 2023-01-11 NOTE — Progress Notes (Signed)
Patient has been notified

## 2023-01-12 NOTE — Progress Notes (Signed)
Pt has been contacted.

## 2023-01-13 ENCOUNTER — Ambulatory Visit (INDEPENDENT_AMBULATORY_CARE_PROVIDER_SITE_OTHER): Payer: Medicare Other

## 2023-01-13 ENCOUNTER — Encounter: Payer: Self-pay | Admitting: Family Medicine

## 2023-01-13 DIAGNOSIS — G20A1 Parkinson's disease without dyskinesia, without mention of fluctuations: Secondary | ICD-10-CM | POA: Diagnosis not present

## 2023-01-13 DIAGNOSIS — Z7901 Long term (current) use of anticoagulants: Secondary | ICD-10-CM

## 2023-01-13 DIAGNOSIS — G43B0 Ophthalmoplegic migraine, not intractable: Secondary | ICD-10-CM | POA: Diagnosis not present

## 2023-01-13 DIAGNOSIS — R1312 Dysphagia, oropharyngeal phase: Secondary | ICD-10-CM | POA: Diagnosis not present

## 2023-01-13 DIAGNOSIS — Z792 Long term (current) use of antibiotics: Secondary | ICD-10-CM | POA: Diagnosis not present

## 2023-01-13 DIAGNOSIS — F32A Depression, unspecified: Secondary | ICD-10-CM | POA: Diagnosis not present

## 2023-01-13 DIAGNOSIS — F02A3 Dementia in other diseases classified elsewhere, mild, with mood disturbance: Secondary | ICD-10-CM | POA: Diagnosis not present

## 2023-01-13 DIAGNOSIS — E785 Hyperlipidemia, unspecified: Secondary | ICD-10-CM | POA: Diagnosis not present

## 2023-01-13 DIAGNOSIS — I4891 Unspecified atrial fibrillation: Secondary | ICD-10-CM | POA: Diagnosis not present

## 2023-01-13 DIAGNOSIS — M545 Low back pain, unspecified: Secondary | ICD-10-CM | POA: Diagnosis not present

## 2023-01-13 DIAGNOSIS — M1990 Unspecified osteoarthritis, unspecified site: Secondary | ICD-10-CM | POA: Diagnosis not present

## 2023-01-13 DIAGNOSIS — Z9181 History of falling: Secondary | ICD-10-CM | POA: Diagnosis not present

## 2023-01-13 DIAGNOSIS — E039 Hypothyroidism, unspecified: Secondary | ICD-10-CM | POA: Diagnosis not present

## 2023-01-13 LAB — POCT INR: INR: 2.4 (ref 2.0–3.0)

## 2023-01-13 MED ORDER — SERTRALINE HCL 25 MG PO TABS: 25.0000 mg | ORAL_TABLET | Freq: Every day | ORAL | 1 refills | Status: DC

## 2023-01-13 NOTE — Addendum Note (Signed)
Addended by: Sherrilee Gilles B on: 01/13/2023 12:39 PM   Modules accepted: Orders

## 2023-01-13 NOTE — Progress Notes (Signed)
Pt tests at home with help from her daughter, Berline Chough. Result of 2.4 today obtained through mdINR portal.  Continue to take 2 tablets (4 mg total) daily except take 1 tablet on Mondays. Recheck in one week. [name], pt's daughter understands if pt is in range to continue normal dosing and recheck in 1 week. She does not need a phone call.

## 2023-01-13 NOTE — Patient Instructions (Addendum)
Pre visit review using our clinic review tool, if applicable. No additional management support is needed unless otherwise documented below in the visit note.  Continue to take 2 tablets (4 mg total) daily except take 1 tablet on Mondays. Recheck in one week.  

## 2023-01-19 ENCOUNTER — Telehealth: Payer: Self-pay

## 2023-01-19 NOTE — Telephone Encounter (Signed)
[name], pt's daughter, reports pt is still having daily headaches and PT suggested she use Excedrin and she wanted to make sure it was ok to take with warfarin.  She reports pt has been using neck exercises and expansion pillow around her neck but the headaches do not seem to be getting much better. She is currently using acetaminophen.  Advised there is one type of Excedrin that does not have aspirin in it, and it is called Tension Headache Excedrin. Advised it is acetaminophen and caffeine. Advised to monitor daily doses of acetaminophen the pt is taking and do not exceed the daily recommendation on the acetaminophen bottle. Advised she will need to add all the products containing acetaminophen together to make sure she is not exceeding the daily dosing.   She is going to pick some up this evening and have the pt try it tomorrow t see if it helps. Advised the caffeine in it may disrupt pt's sleep so may need to watch timing of dosing.  Advised a msg would be sent to PCP to make him aware. Advised if any changes to contact the office. [name] verbalized understanding.

## 2023-01-19 NOTE — Telephone Encounter (Signed)
Agreed.  Please let me know how it goes.  Thanks.

## 2023-01-20 ENCOUNTER — Encounter: Payer: Self-pay | Admitting: Neurology

## 2023-01-20 ENCOUNTER — Ambulatory Visit (INDEPENDENT_AMBULATORY_CARE_PROVIDER_SITE_OTHER): Payer: Medicare Other

## 2023-01-20 DIAGNOSIS — Z7901 Long term (current) use of anticoagulants: Secondary | ICD-10-CM | POA: Diagnosis not present

## 2023-01-20 LAB — POCT INR: INR: 2.3 (ref 2.0–3.0)

## 2023-01-20 NOTE — Progress Notes (Signed)
Pt tests at home with help from her daughter, Berline Chough. Result of 2.3 today obtained through mdINR portal.  Continue to take 2 tablets (4 mg total) daily except take 1 tablet on Mondays. Recheck in one week. [name], pt's daughter understands if pt is in range to continue normal dosing and recheck in 1 week. She does not need a phone call.

## 2023-01-20 NOTE — Patient Instructions (Addendum)
Pre visit review using our clinic review tool, if applicable. No additional management support is needed unless otherwise documented below in the visit note.  Continue to take 2 tablets (4 mg total) daily except take 1 tablet on Mondays. Recheck in one week.  

## 2023-01-22 ENCOUNTER — Encounter: Payer: Self-pay | Admitting: Family Medicine

## 2023-01-25 ENCOUNTER — Telehealth: Payer: Self-pay | Admitting: Family Medicine

## 2023-01-25 NOTE — Telephone Encounter (Signed)
Please give the order.  Thanks.   

## 2023-01-25 NOTE — Telephone Encounter (Signed)
Verbal orders given to Tish.

## 2023-01-25 NOTE — Telephone Encounter (Signed)
Home Health verbal orders [place] Agency Name: [facility] number: [phone]  Requesting OT/PT/Skilled nursing/Social Work/Speech: PT  Reason:gate training,transfer training  Frequency:1 [place] 2  Please forward to [facility] pool or providers CMA

## 2023-01-27 ENCOUNTER — Ambulatory Visit (INDEPENDENT_AMBULATORY_CARE_PROVIDER_SITE_OTHER): Payer: Medicare Other

## 2023-01-27 DIAGNOSIS — Z7901 Long term (current) use of anticoagulants: Secondary | ICD-10-CM | POA: Diagnosis not present

## 2023-01-27 LAB — POCT INR: INR: 2.7 (ref 2.0–3.0)

## 2023-01-27 NOTE — Patient Instructions (Addendum)
Pre visit review using our clinic review tool, if applicable. No additional management support is needed unless otherwise documented below in the visit note.  Continue to take 2 tablets (4 mg total) daily except take 1 tablet on Mondays. Recheck in one week.  

## 2023-01-27 NOTE — Progress Notes (Signed)
Pt tests at home with help from her daughter, Berline Chough. Result of 2.7 today obtained through mdINR portal.  Continue to take 2 tablets (4 mg total) daily except take 1 tablet on Mondays. Recheck in one week. [name], pt's daughter understands if pt is in range to continue normal dosing and recheck in 1 week. She does not need a phone call.

## 2023-01-28 DIAGNOSIS — Z792 Long term (current) use of antibiotics: Secondary | ICD-10-CM | POA: Diagnosis not present

## 2023-01-28 DIAGNOSIS — I4891 Unspecified atrial fibrillation: Secondary | ICD-10-CM | POA: Diagnosis not present

## 2023-01-28 DIAGNOSIS — Z9181 History of falling: Secondary | ICD-10-CM | POA: Diagnosis not present

## 2023-01-28 DIAGNOSIS — M1990 Unspecified osteoarthritis, unspecified site: Secondary | ICD-10-CM | POA: Diagnosis not present

## 2023-01-28 DIAGNOSIS — F32A Depression, unspecified: Secondary | ICD-10-CM | POA: Diagnosis not present

## 2023-01-28 DIAGNOSIS — E785 Hyperlipidemia, unspecified: Secondary | ICD-10-CM | POA: Diagnosis not present

## 2023-01-28 DIAGNOSIS — R1312 Dysphagia, oropharyngeal phase: Secondary | ICD-10-CM | POA: Diagnosis not present

## 2023-01-28 DIAGNOSIS — M545 Low back pain, unspecified: Secondary | ICD-10-CM | POA: Diagnosis not present

## 2023-01-28 DIAGNOSIS — Z7901 Long term (current) use of anticoagulants: Secondary | ICD-10-CM | POA: Diagnosis not present

## 2023-01-28 DIAGNOSIS — F02A3 Dementia in other diseases classified elsewhere, mild, with mood disturbance: Secondary | ICD-10-CM | POA: Diagnosis not present

## 2023-01-28 DIAGNOSIS — G20A1 Parkinson's disease without dyskinesia, without mention of fluctuations: Secondary | ICD-10-CM | POA: Diagnosis not present

## 2023-01-28 DIAGNOSIS — E039 Hypothyroidism, unspecified: Secondary | ICD-10-CM | POA: Diagnosis not present

## 2023-01-28 DIAGNOSIS — G43B0 Ophthalmoplegic migraine, not intractable: Secondary | ICD-10-CM | POA: Diagnosis not present

## 2023-02-02 DIAGNOSIS — Z7901 Long term (current) use of anticoagulants: Secondary | ICD-10-CM | POA: Diagnosis not present

## 2023-02-02 DIAGNOSIS — E039 Hypothyroidism, unspecified: Secondary | ICD-10-CM | POA: Diagnosis not present

## 2023-02-02 DIAGNOSIS — Z792 Long term (current) use of antibiotics: Secondary | ICD-10-CM | POA: Diagnosis not present

## 2023-02-02 DIAGNOSIS — Z9181 History of falling: Secondary | ICD-10-CM | POA: Diagnosis not present

## 2023-02-02 DIAGNOSIS — G20A1 Parkinson's disease without dyskinesia, without mention of fluctuations: Secondary | ICD-10-CM | POA: Diagnosis not present

## 2023-02-02 DIAGNOSIS — M1990 Unspecified osteoarthritis, unspecified site: Secondary | ICD-10-CM | POA: Diagnosis not present

## 2023-02-02 DIAGNOSIS — I4891 Unspecified atrial fibrillation: Secondary | ICD-10-CM | POA: Diagnosis not present

## 2023-02-02 DIAGNOSIS — G43B0 Ophthalmoplegic migraine, not intractable: Secondary | ICD-10-CM | POA: Diagnosis not present

## 2023-02-02 DIAGNOSIS — R1312 Dysphagia, oropharyngeal phase: Secondary | ICD-10-CM | POA: Diagnosis not present

## 2023-02-02 DIAGNOSIS — E785 Hyperlipidemia, unspecified: Secondary | ICD-10-CM | POA: Diagnosis not present

## 2023-02-02 DIAGNOSIS — F32A Depression, unspecified: Secondary | ICD-10-CM | POA: Diagnosis not present

## 2023-02-02 DIAGNOSIS — M545 Low back pain, unspecified: Secondary | ICD-10-CM | POA: Diagnosis not present

## 2023-02-02 DIAGNOSIS — F02A3 Dementia in other diseases classified elsewhere, mild, with mood disturbance: Secondary | ICD-10-CM | POA: Diagnosis not present

## 2023-02-03 ENCOUNTER — Ambulatory Visit (INDEPENDENT_AMBULATORY_CARE_PROVIDER_SITE_OTHER): Payer: Medicare Other

## 2023-02-03 ENCOUNTER — Encounter: Payer: Medicare Other | Admitting: Neurology

## 2023-02-03 ENCOUNTER — Encounter: Payer: Self-pay | Admitting: Family Medicine

## 2023-02-03 DIAGNOSIS — Z7901 Long term (current) use of anticoagulants: Secondary | ICD-10-CM

## 2023-02-03 LAB — POCT INR: INR: 2 (ref 2.0–3.0)

## 2023-02-03 NOTE — Progress Notes (Signed)
Pt tests at home with help from her daughter, Berline Chough. Result of 2.0 today obtained through mdINR portal.  Continue to take 2 tablets (4 mg total) daily except take 1 tablet on Mondays. Recheck in one week. [name], pt's daughter understands if pt is in range to continue normal dosing and recheck in 1 week. She does not need a phone call.

## 2023-02-03 NOTE — Patient Instructions (Addendum)
Pre visit review using our clinic review tool, if applicable. No additional management support is needed unless otherwise documented below in the visit note.  Continue to take 2 tablets (4 mg total) daily except take 1 tablet on Mondays. Recheck in one week.  

## 2023-02-04 ENCOUNTER — Telehealth: Payer: Self-pay | Admitting: Family Medicine

## 2023-02-04 NOTE — Telephone Encounter (Signed)
Shannon-please talk to me about this patient.  Thanks.  ==============================   Hi Dr Para March (and Carollee Herter)  I was talking to a nurse friend of mine and talking about our aging parents and how to simplify and improve their care. They suggested enthusiastically that I ask about switching her to Eliquis from warfarin.  She's due to join an assisted living community in the next month or so and I feel like this would really help. What do you think?  Thank you, [name]

## 2023-02-10 ENCOUNTER — Ambulatory Visit (INDEPENDENT_AMBULATORY_CARE_PROVIDER_SITE_OTHER): Payer: Medicare Other

## 2023-02-10 DIAGNOSIS — Z7901 Long term (current) use of anticoagulants: Secondary | ICD-10-CM

## 2023-02-10 LAB — POCT INR: INR: 3 (ref 2.0–3.0)

## 2023-02-10 NOTE — Telephone Encounter (Signed)
Pt's daughter, [name], called to report INR today and also discuss changing pt to Eliquis. Pt has agreed she would be wiling to change to Eliquis if cost is not prohibitive.  [name] reports cost would be $90 for 90 days at 2.5 mg taking 2 tablets daily.  She is unsure if the pt will fall into the donut hole if switching to Eliquis. She is going to call the insurance and try to get further information. She will f/u with coumadin clinic once there is more information.

## 2023-02-10 NOTE — Progress Notes (Signed)
Pt tests at home with help from her daughter, [name]. Result of 3.0 today obtained through mdINR portal.  Continue to take 2 tablets (4 mg total) daily except take 1 tablet on Mondays. Recheck in one week. [name], pt's daughter understands if pt is in range to continue normal dosing and recheck in 1 week.  She does not need a phone call.  [name] called to discuss starting Eliquis in place of warfarin. She is checking with insurance concerning cost.

## 2023-02-10 NOTE — Telephone Encounter (Signed)
Noted. Thanks.

## 2023-02-10 NOTE — Patient Instructions (Addendum)
Pre visit review using our clinic review tool, if applicable. No additional management support is needed unless otherwise documented below in the visit note.  Continue to take 2 tablets (4 mg total) daily except take 1 tablet on Mondays. Recheck in one week.  

## 2023-02-10 NOTE — Telephone Encounter (Signed)
See my chart message

## 2023-02-11 ENCOUNTER — Encounter: Payer: Medicare Other | Admitting: Neurology

## 2023-02-13 NOTE — Telephone Encounter (Signed)
Mallory Estrada-please talk to me about this.  I did not want to make any changes without talking to you first.  Thanks.

## 2023-02-17 ENCOUNTER — Ambulatory Visit (INDEPENDENT_AMBULATORY_CARE_PROVIDER_SITE_OTHER): Payer: Medicare Other

## 2023-02-17 DIAGNOSIS — Z7901 Long term (current) use of anticoagulants: Secondary | ICD-10-CM

## 2023-02-17 LAB — POCT INR: INR: 2.3 (ref 2.0–3.0)

## 2023-02-17 NOTE — Patient Instructions (Addendum)
Pre visit review using our clinic review tool, if applicable. No additional management support is needed unless otherwise documented below in the visit note.  Continue to take 2 tablets (4 mg total) daily except take 1 tablet on Mondays. Hold dose on Tuesday and Wednesday of next week if Eliquis has been received and patient is ready to start taking it. Recheck in one week.

## 2023-02-17 NOTE — Progress Notes (Signed)
Pt tests at home with help from her daughter, Mallory Estrada. Result of 2.3 today obtained through [facility]INR portal.  Pt decided she would like to transition to Eliquis. Advised INR needs to be <2.0 and then she can start Eliquis the day after. Mallory Estrada would like script sent to [facility] for a 90 day supply. If the Eliquis is received by the mail order pharmacy by next Thursday she will hold Tues and Wed warfarin dose so INR drops to <2.0. She will test INR on Thursday, 4/25.  Continue to take 2 tablets (4 mg total) daily except take 1 tablet on Mondays. Hold dose on Tuesday and Wednesday of next week if Eliquis has been received and patient is ready to start taking it. Recheck in one week. Mallory Estrada, pt's daughter verbalized understanding.

## 2023-02-18 MED ORDER — APIXABAN 5 MG PO TABS: 5.0000 mg | ORAL_TABLET | Freq: Two times a day (BID) | ORAL | 3 refills | Status: DC

## 2023-02-18 NOTE — Addendum Note (Signed)
Addended by: Joaquim Nam on: 02/18/2023 12:08 AM   Modules accepted: Orders

## 2023-02-24 ENCOUNTER — Ambulatory Visit (INDEPENDENT_AMBULATORY_CARE_PROVIDER_SITE_OTHER): Payer: Medicare Other

## 2023-02-24 DIAGNOSIS — Z7901 Long term (current) use of anticoagulants: Secondary | ICD-10-CM | POA: Diagnosis not present

## 2023-02-24 LAB — POCT INR: INR: 3.8 — AB (ref 2.0–3.0)

## 2023-02-24 NOTE — Progress Notes (Signed)
Pt tests at home with help from her daughter, [name]. Result of 3.8 today obtained through mdINR portal.  Pt will be transition to Eliquis. Advised INR needs to be <2.0 and then she can start Eliquis the day after. Pt was to start eliquis this week but the pharmacy has not sent it out until today due to having questions concerning dosing. Call was made to pharmacy and questions answered. [name] received a text that they are working on the order. Pt will start next week if eliquis is received. Pt's daughter, Leotis Shames,  educated about when to stop warfarin. Lauren verbalized understanding. She will still test on Thursday of next week to assure INR is <2.0. [name] reports pt has not been eating greens as she normally does this past week.  Pt has been stable on this dose for several months so no long term change will be made to dosing. Hold dose today and the continue to take 2 tablets (4 mg total) daily except take 1 tablet on Mondays. Hold dose on Tuesday and Wednesday of next week if Eliquis has been received and patient is ready to start taking it. Recheck in one week. [name], pt's daughter verbalized understanding.

## 2023-02-24 NOTE — Patient Instructions (Addendum)
Pre visit review using our clinic review tool, if applicable. No additional management support is needed unless otherwise documented below in the visit note.  Hold dose today and the continue to take 2 tablets (4 mg total) daily except take 1 tablet on Mondays. Hold dose on Tuesday and Wednesday of next week if Eliquis has been received and patient is ready to start taking it. Recheck in one week.

## 2023-02-24 NOTE — Telephone Encounter (Signed)
Received VM from [facility] reporting she received a msg from [facility] reporting they had questions concerning the eliquis prescription.   Contacted Dan, pharmacist with [facility], who wanted to make sure pt should be on 5 mg BID eliquis due to her age. He reports at pt's age sometimes 2.5 mg is warranted. Advised pt only met one criteria in the list of 3 (age >80, creatinine level > 1.5, wt < 60 kg), and that the pt must meet at least two of those criteria to reduce the dose of eliquis to 2.5 mg.  Advised 5 mg BID is the correct prescription for this pt. Dan verbalized understanding and said the script would now move to the shipping department.   Will advise [name] concerning pharmacy hold on eliquis and that they are shipping it now when she calls with INR result for pt today.

## 2023-02-27 NOTE — Telephone Encounter (Signed)
Noted. Thanks.

## 2023-03-01 NOTE — Telephone Encounter (Signed)
[name] called to report Eliquis has been received. She will hold pt's warfarin today and tomorrow and test on Thursday to assure INR is <2.0  If INR is <2.0 pt will start Eliquis Thursday evening.

## 2023-03-02 NOTE — Telephone Encounter (Signed)
Noted. Thanks.

## 2023-03-03 ENCOUNTER — Ambulatory Visit (INDEPENDENT_AMBULATORY_CARE_PROVIDER_SITE_OTHER): Payer: Medicare Other

## 2023-03-03 DIAGNOSIS — Z7901 Long term (current) use of anticoagulants: Secondary | ICD-10-CM | POA: Diagnosis not present

## 2023-03-03 LAB — POCT INR: INR: 1.5 — AB (ref 2.0–3.0)

## 2023-03-03 NOTE — Addendum Note (Signed)
Addended by: Joaquim Nam on: 03/03/2023 04:54 PM   Modules accepted: Orders

## 2023-03-03 NOTE — Progress Notes (Signed)
Pt tests at home with help from her daughter, [name]. Result of 1.5 today obtained through mdINR portal.  Pt has received Eliquis in mail and will transition to Eliquis starting this evening.   Stop taking warfarin and start taking Eliquis, 5mg , BID. Take first dose of Eliquis this evening. [name], pt's daughter verbalized understanding.  Resolved anticoagulation episodes.

## 2023-03-03 NOTE — Patient Instructions (Addendum)
Pre visit review using our clinic review tool, if applicable. No additional management support is needed unless otherwise documented below in the visit note.  Stop taking warfarin and start taking Eliquis, 5mg , BID. Take first dose of Eliquis this evening.

## 2023-03-07 ENCOUNTER — Other Ambulatory Visit: Payer: Self-pay

## 2023-03-07 MED ORDER — ESTRING 2 MG VA RING: 2.0000 mg | VAGINAL_RING | VAGINAL | Status: DC

## 2023-03-08 ENCOUNTER — Telehealth: Payer: Self-pay

## 2023-03-08 NOTE — Telephone Encounter (Signed)
Noted. Thanks.

## 2023-03-08 NOTE — Telephone Encounter (Signed)
Pt's daughter, [name], called with pt to inquire if she should check pt's INR this Thursday as she usually did with warfarin. Advised it is not necessary to test INR with Eliquis. Advised that medication does not have all of the interactions with foods, medications, etc, as warfarin does, so continuous monitoring is not necessary.  [name] inquired if pt can start some of the herbs and other things she wanted the pt to try while on warfarin but was unable due to the interaction with warfarin. Advised to research to assure they are not natural anticoagulants. Advised to send a msg to the coumadin clinic or PCP before starting any new herb/OTC medication. [name] reports seh will contact [facility] and let them know pt no longer needs INR machine. Advised if any help is needed in the future to not hesitate to contact the coumadin clinic. [name] and pt verbalized understanding.

## 2023-03-09 ENCOUNTER — Encounter: Payer: Self-pay | Admitting: Family Medicine

## 2023-03-12 ENCOUNTER — Encounter: Payer: Self-pay | Admitting: Family Medicine

## 2023-03-14 ENCOUNTER — Telehealth: Payer: Self-pay | Admitting: Family Medicine

## 2023-03-14 NOTE — Telephone Encounter (Signed)
I spoke with [name] (DPR signed) couple of wks ago when pt was going to bed had fluttering sensation on lt side of chest and shoulder that lasted on and off for about 1 hr. Pt had not had this symptom before. No CP or SOB. Also last couple of weeks swelling in ankles; swelling goes down overnight. No other symptoms and no fluttering since the one episode couple of wks ago. Pt has no lower leg swelling, pain or redness. Pt has not seen card recently.Offered appt this afternoon but [name] said since no recent episodes and pt prefers to see Dr Para March an appt with Dr Para March was scheduled 03/15/23 at 3 pm with UC & ED precautions and [facility] voiced understanding. Sending note to Dr Para March.

## 2023-03-14 NOTE — Telephone Encounter (Signed)
Please call and triage patient about any persistent sx re: fluttering.  She may need OV here when possible, assuming no emergent symptoms.  Thanks.

## 2023-03-14 NOTE — Telephone Encounter (Signed)
Noted. Thanks.

## 2023-03-15 ENCOUNTER — Encounter: Payer: Self-pay | Admitting: Family Medicine

## 2023-03-15 ENCOUNTER — Ambulatory Visit (INDEPENDENT_AMBULATORY_CARE_PROVIDER_SITE_OTHER): Payer: Medicare Other | Admitting: Family Medicine

## 2023-03-15 VITALS — BP 104/62 | HR 54 | Temp 98.4°F

## 2023-03-15 DIAGNOSIS — R0989 Other specified symptoms and signs involving the circulatory and respiratory systems: Secondary | ICD-10-CM | POA: Diagnosis not present

## 2023-03-15 DIAGNOSIS — R0602 Shortness of breath: Secondary | ICD-10-CM

## 2023-03-15 NOTE — Progress Notes (Unsigned)
No bleeding on eliquis.  Compliant.  Off warfarin.   R ankle persistently swelling.  Still on 20 vs 40mg  lasix.  Some possible SOB, some occ crackle heard in her voice/breath sound.  Not SOB supine.    No recent need/use of metoprolol.    Recent episode- she was trying to get to sleep.  She was laying on L side.  "It was almost like a spasm."  The next day, she told her family she felt a fluttering, like she could see a shuddering on her chest wall.  Went on intermittently for about 30 minutes.  No syncope.    This was 5 days ago.  No events since.    Meds, vitals, and allergies reviewed.   ROS: Per HPI unless specifically indicated in ROS section   R>L ankle edema.

## 2023-03-15 NOTE — Progress Notes (Unsigned)
[facility] Return Visit  SUBJECTIVE  History of Present Illness: Mallory Estrada is a 82 y.o. female seen in follow-up for vaginal atrophy. Plan at last visit was return for new E-string placement.   Patient's daughter reports they are attempting to get placement at [facility] assisted living.  Past Medical History: Patient  has a past medical history of Arthritis of finger of both hands (06/17/2021), Atrial fibrillation, Gait abnormality, H/O laminectomy (01/14/2022), Hip arthritis, Hyperlipidemia, Hypothyroidism, Insomnia (02/18/2022), Left-sided chest wall pain (10/13/2021), Mild dementia, unclear etiology (09/15/2022), Nonintractable headache (11/02/2021), Ophthalmoplegic migraine, Pulmonary nodule (06/09/2022), Recurrent UTI (03/26/2021), Refusal of blood transfusions as patient is Jehovah's Witness (12/29/2020), Shoulder pain (09/02/2021), SUI (stress urinary incontinence, female), and Toe pain (09/02/2021).   Past Surgical History: She  has a past surgical history that includes Breast biopsy; Tonsillectomy and adenoidectomy; Back surgery; Hip surgery (Bilateral); and Knee surgery (Bilateral).   Medications: She has a current medication list which includes the following prescription(s): acetaminophen, apixaban, atorvastatin, vitamin d3, cyclobenzaprine, doxylamine (sleep), estring, fosfomycin, furosemide, klor-con m20, levothyroxine, loratadine, magnesium gluconate, metoprolol succinate, polyethylene glycol powder, sertraline, and gemtesa.   Allergies: Patient is allergic to fentanyl; gabapentin; ketamine; macrobid [nitrofurantoin]; memantine; sulfa antibiotics; trazodone and nefazodone; and anesthetics, amide.   Social History: Patient  reports that she quit smoking about 52 years ago. Her smoking use included cigarettes. She has never used smokeless tobacco. She reports current alcohol use. She reports that she does not use drugs.      OBJECTIVE     Physical  Exam: Vitals:   03/16/23 1239  BP: (!) 139/58  Pulse: (!) 59   Gen: No apparent distress, A&O x 3.  Detailed Urogynecologic Evaluation:  No vaginal masses, bleeding or other areas of concern on pelvic exam.     ASSESSMENT AND PLAN    Mallory Estrada is a 82 y.o. with:  1. Vaginal atrophy   2. Recurrent UTI    Patient had Estring in from last visit.  Estring replaced today in office and old Estring removed. Patinet's daughter reports that if the patient is able to be placed in an assisted living and the staff are able she may have them change the E-string every 3 months instead of patient coming to the office as she has limited mobility.   Patient to follow up in 3 months or sooner if needed.

## 2023-03-15 NOTE — Patient Instructions (Addendum)
If it happens again, then check your pulse.   See if your pulse is regular or irregular.   If your pulse is above 90, then take a half dose of metoprolol.  If you have chest pain, then dial 911 or go to ER.   Go to the lab on the way out.   If you have mychart we'll likely use that to update you.     Take care.  Glad to see you.

## 2023-03-16 ENCOUNTER — Ambulatory Visit: Payer: Medicare Other | Admitting: Obstetrics and Gynecology

## 2023-03-16 ENCOUNTER — Encounter: Payer: Self-pay | Admitting: Obstetrics and Gynecology

## 2023-03-16 VITALS — BP 139/58 | HR 59

## 2023-03-16 DIAGNOSIS — N952 Postmenopausal atrophic vaginitis: Secondary | ICD-10-CM

## 2023-03-16 DIAGNOSIS — R0989 Other specified symptoms and signs involving the circulatory and respiratory systems: Secondary | ICD-10-CM | POA: Insufficient documentation

## 2023-03-16 DIAGNOSIS — N390 Urinary tract infection, site not specified: Secondary | ICD-10-CM

## 2023-03-16 LAB — CBC WITH DIFFERENTIAL/PLATELET
Basophils Absolute: 0 10*3/uL (ref 0.0–0.1)
Basophils Relative: 0.6 % (ref 0.0–3.0)
Eosinophils Absolute: 0.1 10*3/uL (ref 0.0–0.7)
Eosinophils Relative: 3.1 % (ref 0.0–5.0)
HCT: 39 % (ref 36.0–46.0)
Hemoglobin: 13.1 g/dL (ref 12.0–15.0)
Lymphocytes Relative: 30.4 % (ref 12.0–46.0)
Lymphs Abs: 1.3 10*3/uL (ref 0.7–4.0)
MCHC: 33.7 g/dL (ref 30.0–36.0)
MCV: 96.2 fl (ref 78.0–100.0)
Monocytes Absolute: 0.4 10*3/uL (ref 0.1–1.0)
Monocytes Relative: 8.6 % (ref 3.0–12.0)
Neutro Abs: 2.5 10*3/uL (ref 1.4–7.7)
Neutrophils Relative %: 57.3 % (ref 43.0–77.0)
Platelets: 172 10*3/uL (ref 150.0–400.0)
RBC: 4.05 Mil/uL (ref 3.87–5.11)
RDW: 13.5 % (ref 11.5–15.5)
WBC: 4.3 10*3/uL (ref 4.0–10.5)

## 2023-03-16 LAB — COMPREHENSIVE METABOLIC PANEL
ALT: 11 U/L (ref 0–35)
AST: 18 U/L (ref 0–37)
Albumin: 4.1 g/dL (ref 3.5–5.2)
Alkaline Phosphatase: 58 U/L (ref 39–117)
BUN: 40 mg/dL — ABNORMAL HIGH (ref 6–23)
CO2: 27 mEq/L (ref 19–32)
Calcium: 9.7 mg/dL (ref 8.4–10.5)
Chloride: 105 mEq/L (ref 96–112)
Creatinine, Ser: 1.33 mg/dL — ABNORMAL HIGH (ref 0.40–1.20)
GFR: 37.64 mL/min — ABNORMAL LOW (ref >60.00)
Glucose, Bld: 99 mg/dL (ref 70–99)
Potassium: 4.3 mEq/L (ref 3.5–5.1)
Sodium: 139 mEq/L (ref 135–145)
Total Bilirubin: 0.4 mg/dL (ref 0.2–1.2)
Total Protein: 6.9 g/dL (ref 6.0–8.3)

## 2023-03-16 LAB — BRAIN NATRIURETIC PEPTIDE: Pro B Natriuretic peptide (BNP): 75 pg/mL (ref 0.0–100.0)

## 2023-03-16 NOTE — Assessment & Plan Note (Signed)
She could have had quivering in the muscles of the chest wall or an esophageal event, meaning there are possibilities other than cardiac sources.  Discussed.  She could have had an episode of tachycardia.  She has mild bradycardia on EKG here without acute changes otherwise.  Discussed options, in case she has another event.  If it happens again, then check pulse.   See if pulse is regular or irregular.   If pulse is above 90, then take a half dose of metoprolol.  If having chest pain, then dial 911 or go to ER.  Update me if recurrent symptoms. See notes on labs.  At this point okay for outpatient follow-up.  She agrees to plan.  30 minutes were devoted to patient care in this encounter (this includes time spent reviewing the patient's file/history, interviewing and examining the patient, counseling/reviewing plan with patient).

## 2023-03-17 ENCOUNTER — Encounter: Payer: Self-pay | Admitting: Family Medicine

## 2023-03-22 ENCOUNTER — Telehealth: Payer: Self-pay

## 2023-03-22 NOTE — Telephone Encounter (Signed)
Erroneous entry

## 2023-03-22 NOTE — Telephone Encounter (Deleted)
Patient daughter is requesting medication refills.

## 2023-03-24 ENCOUNTER — Other Ambulatory Visit: Payer: Self-pay | Admitting: Family Medicine

## 2023-03-24 DIAGNOSIS — N390 Urinary tract infection, site not specified: Secondary | ICD-10-CM

## 2023-03-24 MED ORDER — PROGESTERONE MICRONIZED 100 MG PO CAPS: 100.0000 mg | ORAL_CAPSULE | Freq: Every day | ORAL | 1 refills | Status: DC

## 2023-03-27 ENCOUNTER — Encounter: Payer: Self-pay | Admitting: Family Medicine

## 2023-03-29 NOTE — Telephone Encounter (Signed)
This should be addressed with PT right?

## 2023-03-31 ENCOUNTER — Encounter: Payer: Self-pay | Admitting: Family Medicine

## 2023-04-04 ENCOUNTER — Encounter: Payer: Self-pay | Admitting: Physician Assistant

## 2023-04-05 ENCOUNTER — Encounter: Payer: Self-pay | Admitting: Family Medicine

## 2023-04-06 ENCOUNTER — Other Ambulatory Visit: Payer: Self-pay | Admitting: Family Medicine

## 2023-04-07 NOTE — Telephone Encounter (Signed)
Please start a FL2 form with her med list attached and I'll work on it.  Thanks.

## 2023-04-08 ENCOUNTER — Telehealth: Payer: Self-pay | Admitting: Family Medicine

## 2023-04-08 DIAGNOSIS — Z111 Encounter for screening for respiratory tuberculosis: Secondary | ICD-10-CM

## 2023-04-08 DIAGNOSIS — Z119 Encounter for screening for infectious and parasitic diseases, unspecified: Secondary | ICD-10-CM

## 2023-04-08 NOTE — Telephone Encounter (Signed)
Please let her know I put in the order for the lab draw.

## 2023-04-08 NOTE — Telephone Encounter (Signed)
Patient's daughter contacted the office and stated patient is needing a TB test don. Was wanting to only come in once since it is hard to get patient in the office, I recommended a lab draw. Was wondering if these orders could be sent in for patient to have lab TB test done? Please advise, thank you.

## 2023-04-08 NOTE — Addendum Note (Signed)
Addended by: Joaquim Nam on: 04/08/2023 04:49 PM   Modules accepted: Orders

## 2023-04-10 ENCOUNTER — Other Ambulatory Visit: Payer: Self-pay | Admitting: Family Medicine

## 2023-04-10 DIAGNOSIS — N390 Urinary tract infection, site not specified: Secondary | ICD-10-CM

## 2023-04-10 MED ORDER — PROGESTERONE MICRONIZED 100 MG PO CAPS: ORAL_CAPSULE | ORAL | Status: DC

## 2023-04-10 MED ORDER — NONFORMULARY OR COMPOUNDED ITEM: Status: DC

## 2023-04-10 MED ORDER — FUROSEMIDE 20 MG PO TABS: ORAL_TABLET | ORAL | Status: DC

## 2023-04-10 MED ORDER — APIXABAN 5 MG PO TABS: 5.0000 mg | ORAL_TABLET | Freq: Two times a day (BID) | ORAL | Status: DC

## 2023-04-10 MED ORDER — LIDOCAINE 4 % EX PTCH: MEDICATED_PATCH | CUTANEOUS | Status: DC

## 2023-04-10 MED ORDER — SERTRALINE HCL 25 MG PO TABS: 25.0000 mg | ORAL_TABLET | Freq: Every evening | ORAL | Status: DC

## 2023-04-10 MED ORDER — MAGNESIUM GLUCONATE 500 MG PO TABS: 500.0000 mg | ORAL_TABLET | Freq: Every evening | ORAL | Status: DC

## 2023-04-10 MED ORDER — SENNOSIDES-DOCUSATE SODIUM 8.6-50 MG PO TABS: 2.0000 | ORAL_TABLET | Freq: Every day | ORAL | Status: DC

## 2023-04-10 NOTE — Telephone Encounter (Signed)
I updated med list.  Please print a new med list and attach it to the filled out FL 2 and send to patient/daughter.  Thanks.

## 2023-04-11 ENCOUNTER — Other Ambulatory Visit (INDEPENDENT_AMBULATORY_CARE_PROVIDER_SITE_OTHER): Payer: Medicare Other

## 2023-04-11 ENCOUNTER — Encounter: Payer: Self-pay | Admitting: Family Medicine

## 2023-04-11 DIAGNOSIS — Z111 Encounter for screening for respiratory tuberculosis: Secondary | ICD-10-CM

## 2023-04-11 DIAGNOSIS — Z119 Encounter for screening for infectious and parasitic diseases, unspecified: Secondary | ICD-10-CM

## 2023-04-11 NOTE — Telephone Encounter (Signed)
Fredric Mare is calling from [phone]  Re: FL2 and physician supplemental orders  Please call her back when you have a moment

## 2023-04-11 NOTE — Telephone Encounter (Signed)
LM for Mallory Estrada to call back

## 2023-04-11 NOTE — Telephone Encounter (Signed)
Patient's daughter notified.

## 2023-04-11 NOTE — Telephone Encounter (Signed)
Form has been redone and given to Dr. Para March to sign.

## 2023-04-12 ENCOUNTER — Ambulatory Visit: Payer: Medicare Other | Admitting: Physician Assistant

## 2023-04-12 NOTE — Telephone Encounter (Signed)
Forms have been refaxed  

## 2023-04-12 NOTE — Telephone Encounter (Signed)
Mallory Estrada from [facility] called in stating that the way that the [facility] form that was resent back to them was done incomplete. They are a state owned facility and the form has to be completed a certain way.She would like a phone call to discuss the way it has to be completed.   Her direct number: [phone]

## 2023-04-13 ENCOUNTER — Other Ambulatory Visit: Payer: Self-pay | Admitting: Family Medicine

## 2023-04-13 DIAGNOSIS — Z0279 Encounter for issue of other medical certificate: Secondary | ICD-10-CM

## 2023-04-13 LAB — QUANTIFERON-TB GOLD PLUS
Mitogen-NIL: 9.48 IU/mL
NIL: 0.02 IU/mL
QuantiFERON-TB Gold Plus: NEGATIVE
TB1-NIL: 0 IU/mL
TB2-NIL: 0 IU/mL

## 2023-04-13 NOTE — Telephone Encounter (Signed)
Patients daughter called stating that she spoke with the facility and they stated that it is okay for the provider that is covering for Dr Para March today to sign off on her moms FL2 form,since today is the deadline to have it completed.

## 2023-04-13 NOTE — Telephone Encounter (Signed)
Please let me know if there is anything else outstanding on this on Thursday.  Thanks.

## 2023-04-13 NOTE — Telephone Encounter (Signed)
Mallory Estrada, pts daughter dropped off a completed FL2 for Dr. Para March to fill out.  Spoke with her and let her know that Dr. Para March is out of the office today but we've reached out to him to see if he can sign today.  Will let her know if it gets completed.

## 2023-04-13 NOTE — Telephone Encounter (Signed)
FL2 signed by Dr. Patsy Lager and faxed to facility attention: Mallory Estrada [phone]. Phone call to Mallory Estrada, daughter to let her know that the [facility] had been signed and faxed.  She thanked [place] for getting this complete.

## 2023-04-14 NOTE — Telephone Encounter (Signed)
Form updated and refaxed

## 2023-04-17 ENCOUNTER — Encounter: Payer: Self-pay | Admitting: Family Medicine

## 2023-04-18 DIAGNOSIS — I480 Paroxysmal atrial fibrillation: Secondary | ICD-10-CM | POA: Diagnosis not present

## 2023-04-18 DIAGNOSIS — G3184 Mild cognitive impairment, so stated: Secondary | ICD-10-CM | POA: Diagnosis not present

## 2023-04-18 DIAGNOSIS — E038 Other specified hypothyroidism: Secondary | ICD-10-CM | POA: Diagnosis not present

## 2023-04-18 DIAGNOSIS — M1611 Unilateral primary osteoarthritis, right hip: Secondary | ICD-10-CM | POA: Diagnosis not present

## 2023-04-18 NOTE — Telephone Encounter (Signed)
I will work on getting new FL2 typed up and signed; will fax once done.

## 2023-04-18 NOTE — Telephone Encounter (Signed)
Pt's daughter reports assisted living facility noticed there was a discrepency between the Eliquis dosage on the [facility] form and the dosage that was sent in to pharmacy. FL2 form reports 2.5 mg BID, but pt was prescribed 5 mg BID.  She reports assisted living facility is giving pt 2.5 mg BID because that is what is on the FL2 form. She reports this needs to be corrected before they will give pt the 5 mg BID dose that she was prescribed.  She reports she thinks the provider can just strike through the incorrect dose on the form and then add the correct dose and be sure to initial and date it. Then fax it back to the facility.   Advised a msg would be sent to PCP and CMA for assistance with the correction. Mallory Estrada verbalized understanding.

## 2023-04-18 NOTE — Telephone Encounter (Signed)
Mallory Estrada with fax number for FL2 form.  She reports to make it attention [name] and the fax number is [phone]

## 2023-04-18 NOTE — Telephone Encounter (Signed)
I signed the form. Thanks 

## 2023-04-18 NOTE — Addendum Note (Signed)
Addended by: Wendie Simmer B on: 04/18/2023 11:53 AM   Modules accepted: Orders

## 2023-04-25 DIAGNOSIS — R3915 Urgency of urination: Secondary | ICD-10-CM | POA: Diagnosis not present

## 2023-04-25 DIAGNOSIS — M1611 Unilateral primary osteoarthritis, right hip: Secondary | ICD-10-CM | POA: Diagnosis not present

## 2023-04-25 DIAGNOSIS — I480 Paroxysmal atrial fibrillation: Secondary | ICD-10-CM | POA: Diagnosis not present

## 2023-04-27 DIAGNOSIS — M62511 Muscle wasting and atrophy, not elsewhere classified, right shoulder: Secondary | ICD-10-CM | POA: Diagnosis not present

## 2023-04-27 DIAGNOSIS — M62512 Muscle wasting and atrophy, not elsewhere classified, left shoulder: Secondary | ICD-10-CM | POA: Diagnosis not present

## 2023-04-27 DIAGNOSIS — R2689 Other abnormalities of gait and mobility: Secondary | ICD-10-CM | POA: Diagnosis not present

## 2023-04-27 DIAGNOSIS — M62552 Muscle wasting and atrophy, not elsewhere classified, left thigh: Secondary | ICD-10-CM | POA: Diagnosis not present

## 2023-04-27 DIAGNOSIS — M62551 Muscle wasting and atrophy, not elsewhere classified, right thigh: Secondary | ICD-10-CM | POA: Diagnosis not present

## 2023-04-27 DIAGNOSIS — R1310 Dysphagia, unspecified: Secondary | ICD-10-CM | POA: Diagnosis not present

## 2023-04-27 DIAGNOSIS — R488 Other symbolic dysfunctions: Secondary | ICD-10-CM | POA: Diagnosis not present

## 2023-04-27 DIAGNOSIS — R1312 Dysphagia, oropharyngeal phase: Secondary | ICD-10-CM | POA: Diagnosis not present

## 2023-04-27 DIAGNOSIS — R2681 Unsteadiness on feet: Secondary | ICD-10-CM | POA: Diagnosis not present

## 2023-04-27 DIAGNOSIS — R4789 Other speech disturbances: Secondary | ICD-10-CM | POA: Diagnosis not present

## 2023-04-28 DIAGNOSIS — I10 Essential (primary) hypertension: Secondary | ICD-10-CM | POA: Diagnosis not present

## 2023-04-28 DIAGNOSIS — R488 Other symbolic dysfunctions: Secondary | ICD-10-CM | POA: Diagnosis not present

## 2023-04-28 DIAGNOSIS — R2681 Unsteadiness on feet: Secondary | ICD-10-CM | POA: Diagnosis not present

## 2023-04-28 DIAGNOSIS — E559 Vitamin D deficiency, unspecified: Secondary | ICD-10-CM | POA: Diagnosis not present

## 2023-04-28 DIAGNOSIS — M62511 Muscle wasting and atrophy, not elsewhere classified, right shoulder: Secondary | ICD-10-CM | POA: Diagnosis not present

## 2023-04-28 DIAGNOSIS — M62512 Muscle wasting and atrophy, not elsewhere classified, left shoulder: Secondary | ICD-10-CM | POA: Diagnosis not present

## 2023-04-28 DIAGNOSIS — Z79899 Other long term (current) drug therapy: Secondary | ICD-10-CM | POA: Diagnosis not present

## 2023-04-29 DIAGNOSIS — G8222 Paraplegia, incomplete: Secondary | ICD-10-CM | POA: Diagnosis not present

## 2023-04-29 DIAGNOSIS — I69351 Hemiplegia and hemiparesis following cerebral infarction affecting right dominant side: Secondary | ICD-10-CM | POA: Diagnosis not present

## 2023-04-29 DIAGNOSIS — R2681 Unsteadiness on feet: Secondary | ICD-10-CM | POA: Diagnosis not present

## 2023-04-29 DIAGNOSIS — M62552 Muscle wasting and atrophy, not elsewhere classified, left thigh: Secondary | ICD-10-CM | POA: Diagnosis not present

## 2023-04-29 DIAGNOSIS — M62551 Muscle wasting and atrophy, not elsewhere classified, right thigh: Secondary | ICD-10-CM | POA: Diagnosis not present

## 2023-04-29 DIAGNOSIS — R2689 Other abnormalities of gait and mobility: Secondary | ICD-10-CM | POA: Diagnosis not present

## 2023-05-02 DIAGNOSIS — Z79899 Other long term (current) drug therapy: Secondary | ICD-10-CM | POA: Diagnosis not present

## 2023-05-02 DIAGNOSIS — I480 Paroxysmal atrial fibrillation: Secondary | ICD-10-CM | POA: Diagnosis not present

## 2023-05-02 DIAGNOSIS — M62552 Muscle wasting and atrophy, not elsewhere classified, left thigh: Secondary | ICD-10-CM | POA: Diagnosis not present

## 2023-05-02 DIAGNOSIS — M62551 Muscle wasting and atrophy, not elsewhere classified, right thigh: Secondary | ICD-10-CM | POA: Diagnosis not present

## 2023-05-02 DIAGNOSIS — R633 Feeding difficulties, unspecified: Secondary | ICD-10-CM | POA: Diagnosis not present

## 2023-05-02 DIAGNOSIS — N390 Urinary tract infection, site not specified: Secondary | ICD-10-CM | POA: Diagnosis not present

## 2023-05-02 DIAGNOSIS — R2689 Other abnormalities of gait and mobility: Secondary | ICD-10-CM | POA: Diagnosis not present

## 2023-05-02 DIAGNOSIS — R2681 Unsteadiness on feet: Secondary | ICD-10-CM | POA: Diagnosis not present

## 2023-05-03 DIAGNOSIS — R2681 Unsteadiness on feet: Secondary | ICD-10-CM | POA: Diagnosis not present

## 2023-05-03 DIAGNOSIS — R488 Other symbolic dysfunctions: Secondary | ICD-10-CM | POA: Diagnosis not present

## 2023-05-03 DIAGNOSIS — R1312 Dysphagia, oropharyngeal phase: Secondary | ICD-10-CM | POA: Diagnosis not present

## 2023-05-03 DIAGNOSIS — R4789 Other speech disturbances: Secondary | ICD-10-CM | POA: Diagnosis not present

## 2023-05-03 DIAGNOSIS — M62512 Muscle wasting and atrophy, not elsewhere classified, left shoulder: Secondary | ICD-10-CM | POA: Diagnosis not present

## 2023-05-03 DIAGNOSIS — R1310 Dysphagia, unspecified: Secondary | ICD-10-CM | POA: Diagnosis not present

## 2023-05-03 DIAGNOSIS — M62511 Muscle wasting and atrophy, not elsewhere classified, right shoulder: Secondary | ICD-10-CM | POA: Diagnosis not present

## 2023-05-04 DIAGNOSIS — R488 Other symbolic dysfunctions: Secondary | ICD-10-CM | POA: Diagnosis not present

## 2023-05-04 DIAGNOSIS — M62511 Muscle wasting and atrophy, not elsewhere classified, right shoulder: Secondary | ICD-10-CM | POA: Diagnosis not present

## 2023-05-04 DIAGNOSIS — R2689 Other abnormalities of gait and mobility: Secondary | ICD-10-CM | POA: Diagnosis not present

## 2023-05-04 DIAGNOSIS — M62552 Muscle wasting and atrophy, not elsewhere classified, left thigh: Secondary | ICD-10-CM | POA: Diagnosis not present

## 2023-05-04 DIAGNOSIS — M62551 Muscle wasting and atrophy, not elsewhere classified, right thigh: Secondary | ICD-10-CM | POA: Diagnosis not present

## 2023-05-04 DIAGNOSIS — M62512 Muscle wasting and atrophy, not elsewhere classified, left shoulder: Secondary | ICD-10-CM | POA: Diagnosis not present

## 2023-05-04 DIAGNOSIS — R2681 Unsteadiness on feet: Secondary | ICD-10-CM | POA: Diagnosis not present

## 2023-05-06 DIAGNOSIS — M62512 Muscle wasting and atrophy, not elsewhere classified, left shoulder: Secondary | ICD-10-CM | POA: Diagnosis not present

## 2023-05-06 DIAGNOSIS — M62551 Muscle wasting and atrophy, not elsewhere classified, right thigh: Secondary | ICD-10-CM | POA: Diagnosis not present

## 2023-05-06 DIAGNOSIS — R2689 Other abnormalities of gait and mobility: Secondary | ICD-10-CM | POA: Diagnosis not present

## 2023-05-06 DIAGNOSIS — R488 Other symbolic dysfunctions: Secondary | ICD-10-CM | POA: Diagnosis not present

## 2023-05-06 DIAGNOSIS — R2681 Unsteadiness on feet: Secondary | ICD-10-CM | POA: Diagnosis not present

## 2023-05-06 DIAGNOSIS — M62511 Muscle wasting and atrophy, not elsewhere classified, right shoulder: Secondary | ICD-10-CM | POA: Diagnosis not present

## 2023-05-06 DIAGNOSIS — M62552 Muscle wasting and atrophy, not elsewhere classified, left thigh: Secondary | ICD-10-CM | POA: Diagnosis not present

## 2023-05-09 DIAGNOSIS — M62512 Muscle wasting and atrophy, not elsewhere classified, left shoulder: Secondary | ICD-10-CM | POA: Diagnosis not present

## 2023-05-09 DIAGNOSIS — R488 Other symbolic dysfunctions: Secondary | ICD-10-CM | POA: Diagnosis not present

## 2023-05-09 DIAGNOSIS — M62511 Muscle wasting and atrophy, not elsewhere classified, right shoulder: Secondary | ICD-10-CM | POA: Diagnosis not present

## 2023-05-09 DIAGNOSIS — R2681 Unsteadiness on feet: Secondary | ICD-10-CM | POA: Diagnosis not present

## 2023-05-09 DIAGNOSIS — R2689 Other abnormalities of gait and mobility: Secondary | ICD-10-CM | POA: Diagnosis not present

## 2023-05-09 DIAGNOSIS — M62551 Muscle wasting and atrophy, not elsewhere classified, right thigh: Secondary | ICD-10-CM | POA: Diagnosis not present

## 2023-05-09 DIAGNOSIS — M62552 Muscle wasting and atrophy, not elsewhere classified, left thigh: Secondary | ICD-10-CM | POA: Diagnosis not present

## 2023-05-10 DIAGNOSIS — R1310 Dysphagia, unspecified: Secondary | ICD-10-CM | POA: Diagnosis not present

## 2023-05-10 DIAGNOSIS — R4789 Other speech disturbances: Secondary | ICD-10-CM | POA: Diagnosis not present

## 2023-05-10 DIAGNOSIS — R1312 Dysphagia, oropharyngeal phase: Secondary | ICD-10-CM | POA: Diagnosis not present

## 2023-05-10 DIAGNOSIS — R488 Other symbolic dysfunctions: Secondary | ICD-10-CM | POA: Diagnosis not present

## 2023-05-11 DIAGNOSIS — M62552 Muscle wasting and atrophy, not elsewhere classified, left thigh: Secondary | ICD-10-CM | POA: Diagnosis not present

## 2023-05-11 DIAGNOSIS — M62511 Muscle wasting and atrophy, not elsewhere classified, right shoulder: Secondary | ICD-10-CM | POA: Diagnosis not present

## 2023-05-11 DIAGNOSIS — M62551 Muscle wasting and atrophy, not elsewhere classified, right thigh: Secondary | ICD-10-CM | POA: Diagnosis not present

## 2023-05-11 DIAGNOSIS — R2681 Unsteadiness on feet: Secondary | ICD-10-CM | POA: Diagnosis not present

## 2023-05-11 DIAGNOSIS — M62512 Muscle wasting and atrophy, not elsewhere classified, left shoulder: Secondary | ICD-10-CM | POA: Diagnosis not present

## 2023-05-11 DIAGNOSIS — R488 Other symbolic dysfunctions: Secondary | ICD-10-CM | POA: Diagnosis not present

## 2023-05-11 DIAGNOSIS — R2689 Other abnormalities of gait and mobility: Secondary | ICD-10-CM | POA: Diagnosis not present

## 2023-05-12 DIAGNOSIS — R1312 Dysphagia, oropharyngeal phase: Secondary | ICD-10-CM | POA: Diagnosis not present

## 2023-05-12 DIAGNOSIS — R4789 Other speech disturbances: Secondary | ICD-10-CM | POA: Diagnosis not present

## 2023-05-12 DIAGNOSIS — R1310 Dysphagia, unspecified: Secondary | ICD-10-CM | POA: Diagnosis not present

## 2023-05-12 DIAGNOSIS — R488 Other symbolic dysfunctions: Secondary | ICD-10-CM | POA: Diagnosis not present

## 2023-05-13 DIAGNOSIS — M62512 Muscle wasting and atrophy, not elsewhere classified, left shoulder: Secondary | ICD-10-CM | POA: Diagnosis not present

## 2023-05-13 DIAGNOSIS — M62552 Muscle wasting and atrophy, not elsewhere classified, left thigh: Secondary | ICD-10-CM | POA: Diagnosis not present

## 2023-05-13 DIAGNOSIS — R2689 Other abnormalities of gait and mobility: Secondary | ICD-10-CM | POA: Diagnosis not present

## 2023-05-13 DIAGNOSIS — M62511 Muscle wasting and atrophy, not elsewhere classified, right shoulder: Secondary | ICD-10-CM | POA: Diagnosis not present

## 2023-05-13 DIAGNOSIS — M62551 Muscle wasting and atrophy, not elsewhere classified, right thigh: Secondary | ICD-10-CM | POA: Diagnosis not present

## 2023-05-13 DIAGNOSIS — R488 Other symbolic dysfunctions: Secondary | ICD-10-CM | POA: Diagnosis not present

## 2023-05-13 DIAGNOSIS — R2681 Unsteadiness on feet: Secondary | ICD-10-CM | POA: Diagnosis not present

## 2023-05-16 DIAGNOSIS — Z79899 Other long term (current) drug therapy: Secondary | ICD-10-CM | POA: Diagnosis not present

## 2023-05-16 DIAGNOSIS — M62512 Muscle wasting and atrophy, not elsewhere classified, left shoulder: Secondary | ICD-10-CM | POA: Diagnosis not present

## 2023-05-16 DIAGNOSIS — M62552 Muscle wasting and atrophy, not elsewhere classified, left thigh: Secondary | ICD-10-CM | POA: Diagnosis not present

## 2023-05-16 DIAGNOSIS — R2681 Unsteadiness on feet: Secondary | ICD-10-CM | POA: Diagnosis not present

## 2023-05-16 DIAGNOSIS — R488 Other symbolic dysfunctions: Secondary | ICD-10-CM | POA: Diagnosis not present

## 2023-05-16 DIAGNOSIS — F5101 Primary insomnia: Secondary | ICD-10-CM | POA: Diagnosis not present

## 2023-05-16 DIAGNOSIS — M62551 Muscle wasting and atrophy, not elsewhere classified, right thigh: Secondary | ICD-10-CM | POA: Diagnosis not present

## 2023-05-16 DIAGNOSIS — M62511 Muscle wasting and atrophy, not elsewhere classified, right shoulder: Secondary | ICD-10-CM | POA: Diagnosis not present

## 2023-05-16 DIAGNOSIS — R2689 Other abnormalities of gait and mobility: Secondary | ICD-10-CM | POA: Diagnosis not present

## 2023-05-16 DIAGNOSIS — R4789 Other speech disturbances: Secondary | ICD-10-CM | POA: Diagnosis not present

## 2023-05-16 DIAGNOSIS — I480 Paroxysmal atrial fibrillation: Secondary | ICD-10-CM | POA: Diagnosis not present

## 2023-05-16 DIAGNOSIS — R1312 Dysphagia, oropharyngeal phase: Secondary | ICD-10-CM | POA: Diagnosis not present

## 2023-05-16 DIAGNOSIS — G3184 Mild cognitive impairment, so stated: Secondary | ICD-10-CM | POA: Diagnosis not present

## 2023-05-16 DIAGNOSIS — R1310 Dysphagia, unspecified: Secondary | ICD-10-CM | POA: Diagnosis not present

## 2023-05-17 DIAGNOSIS — R4789 Other speech disturbances: Secondary | ICD-10-CM | POA: Diagnosis not present

## 2023-05-17 DIAGNOSIS — M62512 Muscle wasting and atrophy, not elsewhere classified, left shoulder: Secondary | ICD-10-CM | POA: Diagnosis not present

## 2023-05-17 DIAGNOSIS — M62511 Muscle wasting and atrophy, not elsewhere classified, right shoulder: Secondary | ICD-10-CM | POA: Diagnosis not present

## 2023-05-17 DIAGNOSIS — R2681 Unsteadiness on feet: Secondary | ICD-10-CM | POA: Diagnosis not present

## 2023-05-17 DIAGNOSIS — R1312 Dysphagia, oropharyngeal phase: Secondary | ICD-10-CM | POA: Diagnosis not present

## 2023-05-17 DIAGNOSIS — R488 Other symbolic dysfunctions: Secondary | ICD-10-CM | POA: Diagnosis not present

## 2023-05-17 DIAGNOSIS — R1310 Dysphagia, unspecified: Secondary | ICD-10-CM | POA: Diagnosis not present

## 2023-05-18 DIAGNOSIS — M62552 Muscle wasting and atrophy, not elsewhere classified, left thigh: Secondary | ICD-10-CM | POA: Diagnosis not present

## 2023-05-18 DIAGNOSIS — R2689 Other abnormalities of gait and mobility: Secondary | ICD-10-CM | POA: Diagnosis not present

## 2023-05-18 DIAGNOSIS — M62551 Muscle wasting and atrophy, not elsewhere classified, right thigh: Secondary | ICD-10-CM | POA: Diagnosis not present

## 2023-05-18 DIAGNOSIS — R2681 Unsteadiness on feet: Secondary | ICD-10-CM | POA: Diagnosis not present

## 2023-05-19 DIAGNOSIS — R488 Other symbolic dysfunctions: Secondary | ICD-10-CM | POA: Diagnosis not present

## 2023-05-19 DIAGNOSIS — R1310 Dysphagia, unspecified: Secondary | ICD-10-CM | POA: Diagnosis not present

## 2023-05-19 DIAGNOSIS — R4789 Other speech disturbances: Secondary | ICD-10-CM | POA: Diagnosis not present

## 2023-05-19 DIAGNOSIS — M62511 Muscle wasting and atrophy, not elsewhere classified, right shoulder: Secondary | ICD-10-CM | POA: Diagnosis not present

## 2023-05-19 DIAGNOSIS — R1312 Dysphagia, oropharyngeal phase: Secondary | ICD-10-CM | POA: Diagnosis not present

## 2023-05-19 DIAGNOSIS — R2681 Unsteadiness on feet: Secondary | ICD-10-CM | POA: Diagnosis not present

## 2023-05-19 DIAGNOSIS — M62512 Muscle wasting and atrophy, not elsewhere classified, left shoulder: Secondary | ICD-10-CM | POA: Diagnosis not present

## 2023-05-20 DIAGNOSIS — M62551 Muscle wasting and atrophy, not elsewhere classified, right thigh: Secondary | ICD-10-CM | POA: Diagnosis not present

## 2023-05-20 DIAGNOSIS — R2681 Unsteadiness on feet: Secondary | ICD-10-CM | POA: Diagnosis not present

## 2023-05-20 DIAGNOSIS — M62552 Muscle wasting and atrophy, not elsewhere classified, left thigh: Secondary | ICD-10-CM | POA: Diagnosis not present

## 2023-05-20 DIAGNOSIS — R2689 Other abnormalities of gait and mobility: Secondary | ICD-10-CM | POA: Diagnosis not present

## 2023-05-23 DIAGNOSIS — R4789 Other speech disturbances: Secondary | ICD-10-CM | POA: Diagnosis not present

## 2023-05-23 DIAGNOSIS — M62511 Muscle wasting and atrophy, not elsewhere classified, right shoulder: Secondary | ICD-10-CM | POA: Diagnosis not present

## 2023-05-23 DIAGNOSIS — M6281 Muscle weakness (generalized): Secondary | ICD-10-CM | POA: Diagnosis not present

## 2023-05-23 DIAGNOSIS — R1312 Dysphagia, oropharyngeal phase: Secondary | ICD-10-CM | POA: Diagnosis not present

## 2023-05-23 DIAGNOSIS — R488 Other symbolic dysfunctions: Secondary | ICD-10-CM | POA: Diagnosis not present

## 2023-05-23 DIAGNOSIS — R2681 Unsteadiness on feet: Secondary | ICD-10-CM | POA: Diagnosis not present

## 2023-05-23 DIAGNOSIS — R262 Difficulty in walking, not elsewhere classified: Secondary | ICD-10-CM | POA: Diagnosis not present

## 2023-05-23 DIAGNOSIS — M62512 Muscle wasting and atrophy, not elsewhere classified, left shoulder: Secondary | ICD-10-CM | POA: Diagnosis not present

## 2023-05-23 DIAGNOSIS — R1310 Dysphagia, unspecified: Secondary | ICD-10-CM | POA: Diagnosis not present

## 2023-05-23 DIAGNOSIS — G3184 Mild cognitive impairment, so stated: Secondary | ICD-10-CM | POA: Diagnosis not present

## 2023-05-24 ENCOUNTER — Other Ambulatory Visit: Payer: Self-pay

## 2023-05-24 DIAGNOSIS — M62552 Muscle wasting and atrophy, not elsewhere classified, left thigh: Secondary | ICD-10-CM | POA: Diagnosis not present

## 2023-05-24 DIAGNOSIS — M62551 Muscle wasting and atrophy, not elsewhere classified, right thigh: Secondary | ICD-10-CM | POA: Diagnosis not present

## 2023-05-24 DIAGNOSIS — R2689 Other abnormalities of gait and mobility: Secondary | ICD-10-CM | POA: Diagnosis not present

## 2023-05-24 DIAGNOSIS — R2681 Unsteadiness on feet: Secondary | ICD-10-CM | POA: Diagnosis not present

## 2023-05-24 MED ORDER — ESTRING 2 MG VA RING: 2.0000 mg | VAGINAL_RING | VAGINAL | Status: DC

## 2023-05-25 DIAGNOSIS — M62552 Muscle wasting and atrophy, not elsewhere classified, left thigh: Secondary | ICD-10-CM | POA: Diagnosis not present

## 2023-05-25 DIAGNOSIS — M62512 Muscle wasting and atrophy, not elsewhere classified, left shoulder: Secondary | ICD-10-CM | POA: Diagnosis not present

## 2023-05-25 DIAGNOSIS — R1310 Dysphagia, unspecified: Secondary | ICD-10-CM | POA: Diagnosis not present

## 2023-05-25 DIAGNOSIS — R1312 Dysphagia, oropharyngeal phase: Secondary | ICD-10-CM | POA: Diagnosis not present

## 2023-05-25 DIAGNOSIS — Z7901 Long term (current) use of anticoagulants: Secondary | ICD-10-CM | POA: Diagnosis not present

## 2023-05-25 DIAGNOSIS — I480 Paroxysmal atrial fibrillation: Secondary | ICD-10-CM | POA: Diagnosis not present

## 2023-05-25 DIAGNOSIS — M62511 Muscle wasting and atrophy, not elsewhere classified, right shoulder: Secondary | ICD-10-CM | POA: Diagnosis not present

## 2023-05-25 DIAGNOSIS — R488 Other symbolic dysfunctions: Secondary | ICD-10-CM | POA: Diagnosis not present

## 2023-05-25 DIAGNOSIS — E038 Other specified hypothyroidism: Secondary | ICD-10-CM | POA: Diagnosis not present

## 2023-05-25 DIAGNOSIS — M62551 Muscle wasting and atrophy, not elsewhere classified, right thigh: Secondary | ICD-10-CM | POA: Diagnosis not present

## 2023-05-25 DIAGNOSIS — R2689 Other abnormalities of gait and mobility: Secondary | ICD-10-CM | POA: Diagnosis not present

## 2023-05-25 DIAGNOSIS — E782 Mixed hyperlipidemia: Secondary | ICD-10-CM | POA: Diagnosis not present

## 2023-05-25 DIAGNOSIS — R4789 Other speech disturbances: Secondary | ICD-10-CM | POA: Diagnosis not present

## 2023-05-25 DIAGNOSIS — R2681 Unsteadiness on feet: Secondary | ICD-10-CM | POA: Diagnosis not present

## 2023-05-26 DIAGNOSIS — R2689 Other abnormalities of gait and mobility: Secondary | ICD-10-CM | POA: Diagnosis not present

## 2023-05-26 DIAGNOSIS — R2681 Unsteadiness on feet: Secondary | ICD-10-CM | POA: Diagnosis not present

## 2023-05-26 DIAGNOSIS — M62551 Muscle wasting and atrophy, not elsewhere classified, right thigh: Secondary | ICD-10-CM | POA: Diagnosis not present

## 2023-05-26 DIAGNOSIS — M62552 Muscle wasting and atrophy, not elsewhere classified, left thigh: Secondary | ICD-10-CM | POA: Diagnosis not present

## 2023-05-27 DIAGNOSIS — R1310 Dysphagia, unspecified: Secondary | ICD-10-CM | POA: Diagnosis not present

## 2023-05-27 DIAGNOSIS — R488 Other symbolic dysfunctions: Secondary | ICD-10-CM | POA: Diagnosis not present

## 2023-05-27 DIAGNOSIS — R1312 Dysphagia, oropharyngeal phase: Secondary | ICD-10-CM | POA: Diagnosis not present

## 2023-05-27 DIAGNOSIS — M62512 Muscle wasting and atrophy, not elsewhere classified, left shoulder: Secondary | ICD-10-CM | POA: Diagnosis not present

## 2023-05-27 DIAGNOSIS — R4789 Other speech disturbances: Secondary | ICD-10-CM | POA: Diagnosis not present

## 2023-05-27 DIAGNOSIS — R2681 Unsteadiness on feet: Secondary | ICD-10-CM | POA: Diagnosis not present

## 2023-05-27 DIAGNOSIS — M62511 Muscle wasting and atrophy, not elsewhere classified, right shoulder: Secondary | ICD-10-CM | POA: Diagnosis not present

## 2023-05-30 DIAGNOSIS — M62511 Muscle wasting and atrophy, not elsewhere classified, right shoulder: Secondary | ICD-10-CM | POA: Diagnosis not present

## 2023-05-30 DIAGNOSIS — R2681 Unsteadiness on feet: Secondary | ICD-10-CM | POA: Diagnosis not present

## 2023-05-30 DIAGNOSIS — R4789 Other speech disturbances: Secondary | ICD-10-CM | POA: Diagnosis not present

## 2023-05-30 DIAGNOSIS — R1310 Dysphagia, unspecified: Secondary | ICD-10-CM | POA: Diagnosis not present

## 2023-05-30 DIAGNOSIS — R1312 Dysphagia, oropharyngeal phase: Secondary | ICD-10-CM | POA: Diagnosis not present

## 2023-05-30 DIAGNOSIS — M62512 Muscle wasting and atrophy, not elsewhere classified, left shoulder: Secondary | ICD-10-CM | POA: Diagnosis not present

## 2023-05-30 DIAGNOSIS — R488 Other symbolic dysfunctions: Secondary | ICD-10-CM | POA: Diagnosis not present

## 2023-05-31 DIAGNOSIS — M62551 Muscle wasting and atrophy, not elsewhere classified, right thigh: Secondary | ICD-10-CM | POA: Diagnosis not present

## 2023-05-31 DIAGNOSIS — M62552 Muscle wasting and atrophy, not elsewhere classified, left thigh: Secondary | ICD-10-CM | POA: Diagnosis not present

## 2023-05-31 DIAGNOSIS — R2689 Other abnormalities of gait and mobility: Secondary | ICD-10-CM | POA: Diagnosis not present

## 2023-05-31 DIAGNOSIS — R2681 Unsteadiness on feet: Secondary | ICD-10-CM | POA: Diagnosis not present

## 2023-06-01 DIAGNOSIS — M62512 Muscle wasting and atrophy, not elsewhere classified, left shoulder: Secondary | ICD-10-CM | POA: Diagnosis not present

## 2023-06-01 DIAGNOSIS — R1312 Dysphagia, oropharyngeal phase: Secondary | ICD-10-CM | POA: Diagnosis not present

## 2023-06-01 DIAGNOSIS — R2681 Unsteadiness on feet: Secondary | ICD-10-CM | POA: Diagnosis not present

## 2023-06-01 DIAGNOSIS — M1611 Unilateral primary osteoarthritis, right hip: Secondary | ICD-10-CM | POA: Diagnosis not present

## 2023-06-01 DIAGNOSIS — E038 Other specified hypothyroidism: Secondary | ICD-10-CM | POA: Diagnosis not present

## 2023-06-01 DIAGNOSIS — R488 Other symbolic dysfunctions: Secondary | ICD-10-CM | POA: Diagnosis not present

## 2023-06-01 DIAGNOSIS — M62511 Muscle wasting and atrophy, not elsewhere classified, right shoulder: Secondary | ICD-10-CM | POA: Diagnosis not present

## 2023-06-01 DIAGNOSIS — R1310 Dysphagia, unspecified: Secondary | ICD-10-CM | POA: Diagnosis not present

## 2023-06-01 DIAGNOSIS — R4789 Other speech disturbances: Secondary | ICD-10-CM | POA: Diagnosis not present

## 2023-06-02 DIAGNOSIS — M62512 Muscle wasting and atrophy, not elsewhere classified, left shoulder: Secondary | ICD-10-CM | POA: Diagnosis not present

## 2023-06-02 DIAGNOSIS — R2689 Other abnormalities of gait and mobility: Secondary | ICD-10-CM | POA: Diagnosis not present

## 2023-06-02 DIAGNOSIS — M62552 Muscle wasting and atrophy, not elsewhere classified, left thigh: Secondary | ICD-10-CM | POA: Diagnosis not present

## 2023-06-02 DIAGNOSIS — R1312 Dysphagia, oropharyngeal phase: Secondary | ICD-10-CM | POA: Diagnosis not present

## 2023-06-02 DIAGNOSIS — M62511 Muscle wasting and atrophy, not elsewhere classified, right shoulder: Secondary | ICD-10-CM | POA: Diagnosis not present

## 2023-06-02 DIAGNOSIS — R488 Other symbolic dysfunctions: Secondary | ICD-10-CM | POA: Diagnosis not present

## 2023-06-02 DIAGNOSIS — R1310 Dysphagia, unspecified: Secondary | ICD-10-CM | POA: Diagnosis not present

## 2023-06-02 DIAGNOSIS — M62551 Muscle wasting and atrophy, not elsewhere classified, right thigh: Secondary | ICD-10-CM | POA: Diagnosis not present

## 2023-06-02 DIAGNOSIS — R4789 Other speech disturbances: Secondary | ICD-10-CM | POA: Diagnosis not present

## 2023-06-02 DIAGNOSIS — R2681 Unsteadiness on feet: Secondary | ICD-10-CM | POA: Diagnosis not present

## 2023-06-03 ENCOUNTER — Encounter: Payer: Self-pay | Admitting: Obstetrics and Gynecology

## 2023-06-03 DIAGNOSIS — M62552 Muscle wasting and atrophy, not elsewhere classified, left thigh: Secondary | ICD-10-CM | POA: Diagnosis not present

## 2023-06-03 DIAGNOSIS — R2689 Other abnormalities of gait and mobility: Secondary | ICD-10-CM | POA: Diagnosis not present

## 2023-06-03 DIAGNOSIS — M62551 Muscle wasting and atrophy, not elsewhere classified, right thigh: Secondary | ICD-10-CM | POA: Diagnosis not present

## 2023-06-03 DIAGNOSIS — R2681 Unsteadiness on feet: Secondary | ICD-10-CM | POA: Diagnosis not present

## 2023-06-06 DIAGNOSIS — R2681 Unsteadiness on feet: Secondary | ICD-10-CM | POA: Diagnosis not present

## 2023-06-06 DIAGNOSIS — R1312 Dysphagia, oropharyngeal phase: Secondary | ICD-10-CM | POA: Diagnosis not present

## 2023-06-06 DIAGNOSIS — R2689 Other abnormalities of gait and mobility: Secondary | ICD-10-CM | POA: Diagnosis not present

## 2023-06-06 DIAGNOSIS — R488 Other symbolic dysfunctions: Secondary | ICD-10-CM | POA: Diagnosis not present

## 2023-06-06 DIAGNOSIS — R4789 Other speech disturbances: Secondary | ICD-10-CM | POA: Diagnosis not present

## 2023-06-06 DIAGNOSIS — M62552 Muscle wasting and atrophy, not elsewhere classified, left thigh: Secondary | ICD-10-CM | POA: Diagnosis not present

## 2023-06-06 DIAGNOSIS — M62551 Muscle wasting and atrophy, not elsewhere classified, right thigh: Secondary | ICD-10-CM | POA: Diagnosis not present

## 2023-06-06 DIAGNOSIS — R1310 Dysphagia, unspecified: Secondary | ICD-10-CM | POA: Diagnosis not present

## 2023-06-07 DIAGNOSIS — R2689 Other abnormalities of gait and mobility: Secondary | ICD-10-CM | POA: Diagnosis not present

## 2023-06-07 DIAGNOSIS — M62552 Muscle wasting and atrophy, not elsewhere classified, left thigh: Secondary | ICD-10-CM | POA: Diagnosis not present

## 2023-06-07 DIAGNOSIS — R2681 Unsteadiness on feet: Secondary | ICD-10-CM | POA: Diagnosis not present

## 2023-06-07 DIAGNOSIS — M62551 Muscle wasting and atrophy, not elsewhere classified, right thigh: Secondary | ICD-10-CM | POA: Diagnosis not present

## 2023-06-08 DIAGNOSIS — R2681 Unsteadiness on feet: Secondary | ICD-10-CM | POA: Diagnosis not present

## 2023-06-08 DIAGNOSIS — M62551 Muscle wasting and atrophy, not elsewhere classified, right thigh: Secondary | ICD-10-CM | POA: Diagnosis not present

## 2023-06-08 DIAGNOSIS — M62511 Muscle wasting and atrophy, not elsewhere classified, right shoulder: Secondary | ICD-10-CM | POA: Diagnosis not present

## 2023-06-08 DIAGNOSIS — M62552 Muscle wasting and atrophy, not elsewhere classified, left thigh: Secondary | ICD-10-CM | POA: Diagnosis not present

## 2023-06-08 DIAGNOSIS — R2689 Other abnormalities of gait and mobility: Secondary | ICD-10-CM | POA: Diagnosis not present

## 2023-06-08 DIAGNOSIS — R488 Other symbolic dysfunctions: Secondary | ICD-10-CM | POA: Diagnosis not present

## 2023-06-08 DIAGNOSIS — M62512 Muscle wasting and atrophy, not elsewhere classified, left shoulder: Secondary | ICD-10-CM | POA: Diagnosis not present

## 2023-06-09 DIAGNOSIS — R1310 Dysphagia, unspecified: Secondary | ICD-10-CM | POA: Diagnosis not present

## 2023-06-09 DIAGNOSIS — M62511 Muscle wasting and atrophy, not elsewhere classified, right shoulder: Secondary | ICD-10-CM | POA: Diagnosis not present

## 2023-06-09 DIAGNOSIS — R4789 Other speech disturbances: Secondary | ICD-10-CM | POA: Diagnosis not present

## 2023-06-09 DIAGNOSIS — M62512 Muscle wasting and atrophy, not elsewhere classified, left shoulder: Secondary | ICD-10-CM | POA: Diagnosis not present

## 2023-06-09 DIAGNOSIS — R488 Other symbolic dysfunctions: Secondary | ICD-10-CM | POA: Diagnosis not present

## 2023-06-09 DIAGNOSIS — R2681 Unsteadiness on feet: Secondary | ICD-10-CM | POA: Diagnosis not present

## 2023-06-09 DIAGNOSIS — R1312 Dysphagia, oropharyngeal phase: Secondary | ICD-10-CM | POA: Diagnosis not present

## 2023-06-10 ENCOUNTER — Ambulatory Visit: Payer: Medicare Other | Admitting: Obstetrics and Gynecology

## 2023-06-10 DIAGNOSIS — R488 Other symbolic dysfunctions: Secondary | ICD-10-CM | POA: Diagnosis not present

## 2023-06-10 DIAGNOSIS — R4789 Other speech disturbances: Secondary | ICD-10-CM | POA: Diagnosis not present

## 2023-06-10 DIAGNOSIS — R1312 Dysphagia, oropharyngeal phase: Secondary | ICD-10-CM | POA: Diagnosis not present

## 2023-06-10 DIAGNOSIS — M62511 Muscle wasting and atrophy, not elsewhere classified, right shoulder: Secondary | ICD-10-CM | POA: Diagnosis not present

## 2023-06-10 DIAGNOSIS — M62512 Muscle wasting and atrophy, not elsewhere classified, left shoulder: Secondary | ICD-10-CM | POA: Diagnosis not present

## 2023-06-10 DIAGNOSIS — R1310 Dysphagia, unspecified: Secondary | ICD-10-CM | POA: Diagnosis not present

## 2023-06-10 DIAGNOSIS — R2681 Unsteadiness on feet: Secondary | ICD-10-CM | POA: Diagnosis not present

## 2023-06-13 DIAGNOSIS — R1312 Dysphagia, oropharyngeal phase: Secondary | ICD-10-CM | POA: Diagnosis not present

## 2023-06-13 DIAGNOSIS — R2681 Unsteadiness on feet: Secondary | ICD-10-CM | POA: Diagnosis not present

## 2023-06-13 DIAGNOSIS — M62551 Muscle wasting and atrophy, not elsewhere classified, right thigh: Secondary | ICD-10-CM | POA: Diagnosis not present

## 2023-06-13 DIAGNOSIS — M62552 Muscle wasting and atrophy, not elsewhere classified, left thigh: Secondary | ICD-10-CM | POA: Diagnosis not present

## 2023-06-13 DIAGNOSIS — R4789 Other speech disturbances: Secondary | ICD-10-CM | POA: Diagnosis not present

## 2023-06-13 DIAGNOSIS — R488 Other symbolic dysfunctions: Secondary | ICD-10-CM | POA: Diagnosis not present

## 2023-06-13 DIAGNOSIS — R2689 Other abnormalities of gait and mobility: Secondary | ICD-10-CM | POA: Diagnosis not present

## 2023-06-13 DIAGNOSIS — R1310 Dysphagia, unspecified: Secondary | ICD-10-CM | POA: Diagnosis not present

## 2023-06-14 DIAGNOSIS — M62512 Muscle wasting and atrophy, not elsewhere classified, left shoulder: Secondary | ICD-10-CM | POA: Diagnosis not present

## 2023-06-14 DIAGNOSIS — R2681 Unsteadiness on feet: Secondary | ICD-10-CM | POA: Diagnosis not present

## 2023-06-14 DIAGNOSIS — R488 Other symbolic dysfunctions: Secondary | ICD-10-CM | POA: Diagnosis not present

## 2023-06-14 DIAGNOSIS — M62511 Muscle wasting and atrophy, not elsewhere classified, right shoulder: Secondary | ICD-10-CM | POA: Diagnosis not present

## 2023-06-15 DIAGNOSIS — R1312 Dysphagia, oropharyngeal phase: Secondary | ICD-10-CM | POA: Diagnosis not present

## 2023-06-15 DIAGNOSIS — R2681 Unsteadiness on feet: Secondary | ICD-10-CM | POA: Diagnosis not present

## 2023-06-15 DIAGNOSIS — M62512 Muscle wasting and atrophy, not elsewhere classified, left shoulder: Secondary | ICD-10-CM | POA: Diagnosis not present

## 2023-06-15 DIAGNOSIS — M62511 Muscle wasting and atrophy, not elsewhere classified, right shoulder: Secondary | ICD-10-CM | POA: Diagnosis not present

## 2023-06-15 DIAGNOSIS — R488 Other symbolic dysfunctions: Secondary | ICD-10-CM | POA: Diagnosis not present

## 2023-06-15 DIAGNOSIS — R1310 Dysphagia, unspecified: Secondary | ICD-10-CM | POA: Diagnosis not present

## 2023-06-15 DIAGNOSIS — R4789 Other speech disturbances: Secondary | ICD-10-CM | POA: Diagnosis not present

## 2023-06-16 DIAGNOSIS — M62551 Muscle wasting and atrophy, not elsewhere classified, right thigh: Secondary | ICD-10-CM | POA: Diagnosis not present

## 2023-06-16 DIAGNOSIS — M62552 Muscle wasting and atrophy, not elsewhere classified, left thigh: Secondary | ICD-10-CM | POA: Diagnosis not present

## 2023-06-16 DIAGNOSIS — R2681 Unsteadiness on feet: Secondary | ICD-10-CM | POA: Diagnosis not present

## 2023-06-16 DIAGNOSIS — R2689 Other abnormalities of gait and mobility: Secondary | ICD-10-CM | POA: Diagnosis not present

## 2023-06-17 DIAGNOSIS — R1310 Dysphagia, unspecified: Secondary | ICD-10-CM | POA: Diagnosis not present

## 2023-06-17 DIAGNOSIS — M62512 Muscle wasting and atrophy, not elsewhere classified, left shoulder: Secondary | ICD-10-CM | POA: Diagnosis not present

## 2023-06-17 DIAGNOSIS — R2689 Other abnormalities of gait and mobility: Secondary | ICD-10-CM | POA: Diagnosis not present

## 2023-06-17 DIAGNOSIS — R4789 Other speech disturbances: Secondary | ICD-10-CM | POA: Diagnosis not present

## 2023-06-17 DIAGNOSIS — M62551 Muscle wasting and atrophy, not elsewhere classified, right thigh: Secondary | ICD-10-CM | POA: Diagnosis not present

## 2023-06-17 DIAGNOSIS — R2681 Unsteadiness on feet: Secondary | ICD-10-CM | POA: Diagnosis not present

## 2023-06-17 DIAGNOSIS — R1312 Dysphagia, oropharyngeal phase: Secondary | ICD-10-CM | POA: Diagnosis not present

## 2023-06-17 DIAGNOSIS — R488 Other symbolic dysfunctions: Secondary | ICD-10-CM | POA: Diagnosis not present

## 2023-06-17 DIAGNOSIS — M62511 Muscle wasting and atrophy, not elsewhere classified, right shoulder: Secondary | ICD-10-CM | POA: Diagnosis not present

## 2023-06-17 DIAGNOSIS — M62552 Muscle wasting and atrophy, not elsewhere classified, left thigh: Secondary | ICD-10-CM | POA: Diagnosis not present

## 2023-06-20 DIAGNOSIS — R1310 Dysphagia, unspecified: Secondary | ICD-10-CM | POA: Diagnosis not present

## 2023-06-20 DIAGNOSIS — M62551 Muscle wasting and atrophy, not elsewhere classified, right thigh: Secondary | ICD-10-CM | POA: Diagnosis not present

## 2023-06-20 DIAGNOSIS — R1312 Dysphagia, oropharyngeal phase: Secondary | ICD-10-CM | POA: Diagnosis not present

## 2023-06-20 DIAGNOSIS — R2681 Unsteadiness on feet: Secondary | ICD-10-CM | POA: Diagnosis not present

## 2023-06-20 DIAGNOSIS — R4789 Other speech disturbances: Secondary | ICD-10-CM | POA: Diagnosis not present

## 2023-06-20 DIAGNOSIS — R488 Other symbolic dysfunctions: Secondary | ICD-10-CM | POA: Diagnosis not present

## 2023-06-20 DIAGNOSIS — R2689 Other abnormalities of gait and mobility: Secondary | ICD-10-CM | POA: Diagnosis not present

## 2023-06-20 DIAGNOSIS — M62552 Muscle wasting and atrophy, not elsewhere classified, left thigh: Secondary | ICD-10-CM | POA: Diagnosis not present

## 2023-06-20 DIAGNOSIS — K080 Exfoliation of teeth due to systemic causes: Secondary | ICD-10-CM | POA: Diagnosis not present

## 2023-06-21 DIAGNOSIS — R4789 Other speech disturbances: Secondary | ICD-10-CM | POA: Diagnosis not present

## 2023-06-21 DIAGNOSIS — R488 Other symbolic dysfunctions: Secondary | ICD-10-CM | POA: Diagnosis not present

## 2023-06-21 DIAGNOSIS — R1312 Dysphagia, oropharyngeal phase: Secondary | ICD-10-CM | POA: Diagnosis not present

## 2023-06-21 DIAGNOSIS — M62511 Muscle wasting and atrophy, not elsewhere classified, right shoulder: Secondary | ICD-10-CM | POA: Diagnosis not present

## 2023-06-21 DIAGNOSIS — R2681 Unsteadiness on feet: Secondary | ICD-10-CM | POA: Diagnosis not present

## 2023-06-21 DIAGNOSIS — R1310 Dysphagia, unspecified: Secondary | ICD-10-CM | POA: Diagnosis not present

## 2023-06-21 DIAGNOSIS — M62512 Muscle wasting and atrophy, not elsewhere classified, left shoulder: Secondary | ICD-10-CM | POA: Diagnosis not present

## 2023-06-22 ENCOUNTER — Encounter: Payer: Self-pay | Admitting: Obstetrics and Gynecology

## 2023-06-22 ENCOUNTER — Ambulatory Visit: Payer: Medicare Other | Admitting: Obstetrics and Gynecology

## 2023-06-22 VITALS — BP 138/82 | HR 56

## 2023-06-22 DIAGNOSIS — R2689 Other abnormalities of gait and mobility: Secondary | ICD-10-CM | POA: Diagnosis not present

## 2023-06-22 DIAGNOSIS — N952 Postmenopausal atrophic vaginitis: Secondary | ICD-10-CM

## 2023-06-22 DIAGNOSIS — N390 Urinary tract infection, site not specified: Secondary | ICD-10-CM

## 2023-06-22 DIAGNOSIS — M62551 Muscle wasting and atrophy, not elsewhere classified, right thigh: Secondary | ICD-10-CM | POA: Diagnosis not present

## 2023-06-22 DIAGNOSIS — R2681 Unsteadiness on feet: Secondary | ICD-10-CM | POA: Diagnosis not present

## 2023-06-22 DIAGNOSIS — M62552 Muscle wasting and atrophy, not elsewhere classified, left thigh: Secondary | ICD-10-CM | POA: Diagnosis not present

## 2023-06-22 NOTE — Progress Notes (Signed)
[facility] Return Visit  SUBJECTIVE  History of Present Illness: Mallory Estrada is a 82 y.o. female seen in follow-up for E-string placement. Plan at last visit was keep E-string in place to reduce vaginal irritation and UTI risk.     Past Medical History: Patient  has a past medical history of Arthritis of finger of both hands (06/17/2021), Atrial fibrillation, Gait abnormality, H/O laminectomy (01/14/2022), Hip arthritis, Hyperlipidemia, Hypothyroidism, Insomnia (02/18/2022), Left-sided chest wall pain (10/13/2021), Mild dementia, unclear etiology (09/15/2022), Nonintractable headache (11/02/2021), Ophthalmoplegic migraine, Pulmonary nodule (06/09/2022), Recurrent UTI (03/26/2021), Refusal of blood transfusions as patient is Jehovah's Witness (12/29/2020), Shoulder pain (09/02/2021), SUI (stress urinary incontinence, female), and Toe pain (09/02/2021).   Past Surgical History: She  has a past surgical history that includes Breast biopsy; Tonsillectomy and adenoidectomy; Back surgery; Hip surgery (Bilateral); and Knee surgery (Bilateral).   Medications: She has a current medication list which includes the following prescription(s): acetaminophen, apixaban, atorvastatin, vitamin d3, cyclobenzaprine, estring, furosemide, klor-con m20, levothyroxine, lidocaine, loratadine, magnesium gluconate, NONFORMULARY OR COMPOUNDED ITEM, NONFORMULARY OR COMPOUNDED ITEM, NONFORMULARY OR COMPOUNDED ITEM, polyethylene glycol powder, progesterone, senna-docusate, and sertraline.   Allergies: Patient is allergic to fentanyl; gabapentin; ketamine; macrobid [nitrofurantoin]; memantine; sulfa antibiotics; trazodone and nefazodone; and anesthetics, amide.   Social History: Patient  reports that she quit smoking about 52 years ago. Her smoking use included cigarettes. She has never used smokeless tobacco. She reports current alcohol use. She reports that she does not use drugs.      OBJECTIVE      Physical Exam: Vitals:   06/22/23 1111  BP: 138/82  Pulse: (!) 56   Gen: No apparent distress, A&O x 3.  Detailed Urogynecologic Evaluation:   E-string was noted to be in place. This was removed. No obvious mass, bleeding, or other concerning discharge on vaginal exam. New E-string was placed without difficulty. Patient did have urinary leakage on exam.    ASSESSMENT AND PLAN    [name] is a 82 y.o. with:  1. Recurrent UTI   2. Vaginal atrophy    Patient will continue to do E-string replacements to control UTI symptoms and control vaginal atrophy symptoms.   Patient to return in 3 months for E-string replacement.

## 2023-06-23 DIAGNOSIS — M62511 Muscle wasting and atrophy, not elsewhere classified, right shoulder: Secondary | ICD-10-CM | POA: Diagnosis not present

## 2023-06-23 DIAGNOSIS — R1312 Dysphagia, oropharyngeal phase: Secondary | ICD-10-CM | POA: Diagnosis not present

## 2023-06-23 DIAGNOSIS — R4789 Other speech disturbances: Secondary | ICD-10-CM | POA: Diagnosis not present

## 2023-06-23 DIAGNOSIS — R2681 Unsteadiness on feet: Secondary | ICD-10-CM | POA: Diagnosis not present

## 2023-06-23 DIAGNOSIS — M62551 Muscle wasting and atrophy, not elsewhere classified, right thigh: Secondary | ICD-10-CM | POA: Diagnosis not present

## 2023-06-23 DIAGNOSIS — M62552 Muscle wasting and atrophy, not elsewhere classified, left thigh: Secondary | ICD-10-CM | POA: Diagnosis not present

## 2023-06-23 DIAGNOSIS — M62512 Muscle wasting and atrophy, not elsewhere classified, left shoulder: Secondary | ICD-10-CM | POA: Diagnosis not present

## 2023-06-23 DIAGNOSIS — R488 Other symbolic dysfunctions: Secondary | ICD-10-CM | POA: Diagnosis not present

## 2023-06-23 DIAGNOSIS — R1310 Dysphagia, unspecified: Secondary | ICD-10-CM | POA: Diagnosis not present

## 2023-06-23 DIAGNOSIS — R2689 Other abnormalities of gait and mobility: Secondary | ICD-10-CM | POA: Diagnosis not present

## 2023-06-24 DIAGNOSIS — R488 Other symbolic dysfunctions: Secondary | ICD-10-CM | POA: Diagnosis not present

## 2023-06-24 DIAGNOSIS — M62511 Muscle wasting and atrophy, not elsewhere classified, right shoulder: Secondary | ICD-10-CM | POA: Diagnosis not present

## 2023-06-24 DIAGNOSIS — R2681 Unsteadiness on feet: Secondary | ICD-10-CM | POA: Diagnosis not present

## 2023-06-24 DIAGNOSIS — M62512 Muscle wasting and atrophy, not elsewhere classified, left shoulder: Secondary | ICD-10-CM | POA: Diagnosis not present

## 2023-06-27 DIAGNOSIS — R488 Other symbolic dysfunctions: Secondary | ICD-10-CM | POA: Diagnosis not present

## 2023-06-27 DIAGNOSIS — M62511 Muscle wasting and atrophy, not elsewhere classified, right shoulder: Secondary | ICD-10-CM | POA: Diagnosis not present

## 2023-06-27 DIAGNOSIS — R4789 Other speech disturbances: Secondary | ICD-10-CM | POA: Diagnosis not present

## 2023-06-27 DIAGNOSIS — R2689 Other abnormalities of gait and mobility: Secondary | ICD-10-CM | POA: Diagnosis not present

## 2023-06-27 DIAGNOSIS — R2681 Unsteadiness on feet: Secondary | ICD-10-CM | POA: Diagnosis not present

## 2023-06-27 DIAGNOSIS — M62512 Muscle wasting and atrophy, not elsewhere classified, left shoulder: Secondary | ICD-10-CM | POA: Diagnosis not present

## 2023-06-27 DIAGNOSIS — M62551 Muscle wasting and atrophy, not elsewhere classified, right thigh: Secondary | ICD-10-CM | POA: Diagnosis not present

## 2023-06-27 DIAGNOSIS — R1310 Dysphagia, unspecified: Secondary | ICD-10-CM | POA: Diagnosis not present

## 2023-06-27 DIAGNOSIS — M62552 Muscle wasting and atrophy, not elsewhere classified, left thigh: Secondary | ICD-10-CM | POA: Diagnosis not present

## 2023-06-27 DIAGNOSIS — R1312 Dysphagia, oropharyngeal phase: Secondary | ICD-10-CM | POA: Diagnosis not present

## 2023-06-28 DIAGNOSIS — R488 Other symbolic dysfunctions: Secondary | ICD-10-CM | POA: Diagnosis not present

## 2023-06-28 DIAGNOSIS — R4789 Other speech disturbances: Secondary | ICD-10-CM | POA: Diagnosis not present

## 2023-06-28 DIAGNOSIS — R1310 Dysphagia, unspecified: Secondary | ICD-10-CM | POA: Diagnosis not present

## 2023-06-28 DIAGNOSIS — R1312 Dysphagia, oropharyngeal phase: Secondary | ICD-10-CM | POA: Diagnosis not present

## 2023-06-29 DIAGNOSIS — R2681 Unsteadiness on feet: Secondary | ICD-10-CM | POA: Diagnosis not present

## 2023-06-29 DIAGNOSIS — M62551 Muscle wasting and atrophy, not elsewhere classified, right thigh: Secondary | ICD-10-CM | POA: Diagnosis not present

## 2023-06-29 DIAGNOSIS — R2689 Other abnormalities of gait and mobility: Secondary | ICD-10-CM | POA: Diagnosis not present

## 2023-06-29 DIAGNOSIS — M62552 Muscle wasting and atrophy, not elsewhere classified, left thigh: Secondary | ICD-10-CM | POA: Diagnosis not present

## 2023-06-30 DIAGNOSIS — M62511 Muscle wasting and atrophy, not elsewhere classified, right shoulder: Secondary | ICD-10-CM | POA: Diagnosis not present

## 2023-06-30 DIAGNOSIS — R2681 Unsteadiness on feet: Secondary | ICD-10-CM | POA: Diagnosis not present

## 2023-06-30 DIAGNOSIS — R488 Other symbolic dysfunctions: Secondary | ICD-10-CM | POA: Diagnosis not present

## 2023-06-30 DIAGNOSIS — R4789 Other speech disturbances: Secondary | ICD-10-CM | POA: Diagnosis not present

## 2023-06-30 DIAGNOSIS — R1312 Dysphagia, oropharyngeal phase: Secondary | ICD-10-CM | POA: Diagnosis not present

## 2023-06-30 DIAGNOSIS — R1310 Dysphagia, unspecified: Secondary | ICD-10-CM | POA: Diagnosis not present

## 2023-06-30 DIAGNOSIS — M62512 Muscle wasting and atrophy, not elsewhere classified, left shoulder: Secondary | ICD-10-CM | POA: Diagnosis not present

## 2023-07-01 DIAGNOSIS — M62511 Muscle wasting and atrophy, not elsewhere classified, right shoulder: Secondary | ICD-10-CM | POA: Diagnosis not present

## 2023-07-01 DIAGNOSIS — R488 Other symbolic dysfunctions: Secondary | ICD-10-CM | POA: Diagnosis not present

## 2023-07-01 DIAGNOSIS — R2689 Other abnormalities of gait and mobility: Secondary | ICD-10-CM | POA: Diagnosis not present

## 2023-07-01 DIAGNOSIS — M62512 Muscle wasting and atrophy, not elsewhere classified, left shoulder: Secondary | ICD-10-CM | POA: Diagnosis not present

## 2023-07-01 DIAGNOSIS — R2681 Unsteadiness on feet: Secondary | ICD-10-CM | POA: Diagnosis not present

## 2023-07-01 DIAGNOSIS — M62551 Muscle wasting and atrophy, not elsewhere classified, right thigh: Secondary | ICD-10-CM | POA: Diagnosis not present

## 2023-07-01 DIAGNOSIS — M62552 Muscle wasting and atrophy, not elsewhere classified, left thigh: Secondary | ICD-10-CM | POA: Diagnosis not present

## 2023-07-05 DIAGNOSIS — M62551 Muscle wasting and atrophy, not elsewhere classified, right thigh: Secondary | ICD-10-CM | POA: Diagnosis not present

## 2023-07-05 DIAGNOSIS — R1310 Dysphagia, unspecified: Secondary | ICD-10-CM | POA: Diagnosis not present

## 2023-07-05 DIAGNOSIS — R2681 Unsteadiness on feet: Secondary | ICD-10-CM | POA: Diagnosis not present

## 2023-07-05 DIAGNOSIS — R4789 Other speech disturbances: Secondary | ICD-10-CM | POA: Diagnosis not present

## 2023-07-05 DIAGNOSIS — R2689 Other abnormalities of gait and mobility: Secondary | ICD-10-CM | POA: Diagnosis not present

## 2023-07-05 DIAGNOSIS — M62552 Muscle wasting and atrophy, not elsewhere classified, left thigh: Secondary | ICD-10-CM | POA: Diagnosis not present

## 2023-07-05 DIAGNOSIS — R1312 Dysphagia, oropharyngeal phase: Secondary | ICD-10-CM | POA: Diagnosis not present

## 2023-07-05 DIAGNOSIS — R488 Other symbolic dysfunctions: Secondary | ICD-10-CM | POA: Diagnosis not present

## 2023-07-06 DIAGNOSIS — M62512 Muscle wasting and atrophy, not elsewhere classified, left shoulder: Secondary | ICD-10-CM | POA: Diagnosis not present

## 2023-07-06 DIAGNOSIS — R2681 Unsteadiness on feet: Secondary | ICD-10-CM | POA: Diagnosis not present

## 2023-07-06 DIAGNOSIS — R488 Other symbolic dysfunctions: Secondary | ICD-10-CM | POA: Diagnosis not present

## 2023-07-06 DIAGNOSIS — M62511 Muscle wasting and atrophy, not elsewhere classified, right shoulder: Secondary | ICD-10-CM | POA: Diagnosis not present

## 2023-07-07 DIAGNOSIS — R2681 Unsteadiness on feet: Secondary | ICD-10-CM | POA: Diagnosis not present

## 2023-07-07 DIAGNOSIS — M62511 Muscle wasting and atrophy, not elsewhere classified, right shoulder: Secondary | ICD-10-CM | POA: Diagnosis not present

## 2023-07-07 DIAGNOSIS — M62512 Muscle wasting and atrophy, not elsewhere classified, left shoulder: Secondary | ICD-10-CM | POA: Diagnosis not present

## 2023-07-07 DIAGNOSIS — R1312 Dysphagia, oropharyngeal phase: Secondary | ICD-10-CM | POA: Diagnosis not present

## 2023-07-07 DIAGNOSIS — R1310 Dysphagia, unspecified: Secondary | ICD-10-CM | POA: Diagnosis not present

## 2023-07-07 DIAGNOSIS — R488 Other symbolic dysfunctions: Secondary | ICD-10-CM | POA: Diagnosis not present

## 2023-07-07 DIAGNOSIS — R4789 Other speech disturbances: Secondary | ICD-10-CM | POA: Diagnosis not present

## 2023-07-08 DIAGNOSIS — M62512 Muscle wasting and atrophy, not elsewhere classified, left shoulder: Secondary | ICD-10-CM | POA: Diagnosis not present

## 2023-07-08 DIAGNOSIS — M62511 Muscle wasting and atrophy, not elsewhere classified, right shoulder: Secondary | ICD-10-CM | POA: Diagnosis not present

## 2023-07-08 DIAGNOSIS — R2689 Other abnormalities of gait and mobility: Secondary | ICD-10-CM | POA: Diagnosis not present

## 2023-07-08 DIAGNOSIS — R488 Other symbolic dysfunctions: Secondary | ICD-10-CM | POA: Diagnosis not present

## 2023-07-08 DIAGNOSIS — M62552 Muscle wasting and atrophy, not elsewhere classified, left thigh: Secondary | ICD-10-CM | POA: Diagnosis not present

## 2023-07-08 DIAGNOSIS — R2681 Unsteadiness on feet: Secondary | ICD-10-CM | POA: Diagnosis not present

## 2023-07-08 DIAGNOSIS — M62551 Muscle wasting and atrophy, not elsewhere classified, right thigh: Secondary | ICD-10-CM | POA: Diagnosis not present

## 2023-07-11 DIAGNOSIS — R2681 Unsteadiness on feet: Secondary | ICD-10-CM | POA: Diagnosis not present

## 2023-07-11 DIAGNOSIS — R488 Other symbolic dysfunctions: Secondary | ICD-10-CM | POA: Diagnosis not present

## 2023-07-11 DIAGNOSIS — M62512 Muscle wasting and atrophy, not elsewhere classified, left shoulder: Secondary | ICD-10-CM | POA: Diagnosis not present

## 2023-07-11 DIAGNOSIS — M62511 Muscle wasting and atrophy, not elsewhere classified, right shoulder: Secondary | ICD-10-CM | POA: Diagnosis not present

## 2023-07-12 DIAGNOSIS — M62551 Muscle wasting and atrophy, not elsewhere classified, right thigh: Secondary | ICD-10-CM | POA: Diagnosis not present

## 2023-07-12 DIAGNOSIS — M62552 Muscle wasting and atrophy, not elsewhere classified, left thigh: Secondary | ICD-10-CM | POA: Diagnosis not present

## 2023-07-12 DIAGNOSIS — R488 Other symbolic dysfunctions: Secondary | ICD-10-CM | POA: Diagnosis not present

## 2023-07-12 DIAGNOSIS — R2681 Unsteadiness on feet: Secondary | ICD-10-CM | POA: Diagnosis not present

## 2023-07-12 DIAGNOSIS — M62512 Muscle wasting and atrophy, not elsewhere classified, left shoulder: Secondary | ICD-10-CM | POA: Diagnosis not present

## 2023-07-12 DIAGNOSIS — M62511 Muscle wasting and atrophy, not elsewhere classified, right shoulder: Secondary | ICD-10-CM | POA: Diagnosis not present

## 2023-07-12 DIAGNOSIS — R2689 Other abnormalities of gait and mobility: Secondary | ICD-10-CM | POA: Diagnosis not present

## 2023-07-13 DIAGNOSIS — M62511 Muscle wasting and atrophy, not elsewhere classified, right shoulder: Secondary | ICD-10-CM | POA: Diagnosis not present

## 2023-07-13 DIAGNOSIS — R2681 Unsteadiness on feet: Secondary | ICD-10-CM | POA: Diagnosis not present

## 2023-07-13 DIAGNOSIS — R488 Other symbolic dysfunctions: Secondary | ICD-10-CM | POA: Diagnosis not present

## 2023-07-13 DIAGNOSIS — M62512 Muscle wasting and atrophy, not elsewhere classified, left shoulder: Secondary | ICD-10-CM | POA: Diagnosis not present

## 2023-07-14 DIAGNOSIS — R2689 Other abnormalities of gait and mobility: Secondary | ICD-10-CM | POA: Diagnosis not present

## 2023-07-14 DIAGNOSIS — M62552 Muscle wasting and atrophy, not elsewhere classified, left thigh: Secondary | ICD-10-CM | POA: Diagnosis not present

## 2023-07-14 DIAGNOSIS — M62551 Muscle wasting and atrophy, not elsewhere classified, right thigh: Secondary | ICD-10-CM | POA: Diagnosis not present

## 2023-07-14 DIAGNOSIS — R2681 Unsteadiness on feet: Secondary | ICD-10-CM | POA: Diagnosis not present

## 2023-07-19 DIAGNOSIS — M62512 Muscle wasting and atrophy, not elsewhere classified, left shoulder: Secondary | ICD-10-CM | POA: Diagnosis not present

## 2023-07-19 DIAGNOSIS — M62511 Muscle wasting and atrophy, not elsewhere classified, right shoulder: Secondary | ICD-10-CM | POA: Diagnosis not present

## 2023-07-19 DIAGNOSIS — R488 Other symbolic dysfunctions: Secondary | ICD-10-CM | POA: Diagnosis not present

## 2023-07-19 DIAGNOSIS — R2681 Unsteadiness on feet: Secondary | ICD-10-CM | POA: Diagnosis not present

## 2023-07-20 DIAGNOSIS — M62552 Muscle wasting and atrophy, not elsewhere classified, left thigh: Secondary | ICD-10-CM | POA: Diagnosis not present

## 2023-07-20 DIAGNOSIS — M62551 Muscle wasting and atrophy, not elsewhere classified, right thigh: Secondary | ICD-10-CM | POA: Diagnosis not present

## 2023-07-20 DIAGNOSIS — R2681 Unsteadiness on feet: Secondary | ICD-10-CM | POA: Diagnosis not present

## 2023-07-20 DIAGNOSIS — R2689 Other abnormalities of gait and mobility: Secondary | ICD-10-CM | POA: Diagnosis not present

## 2023-07-21 DIAGNOSIS — M62552 Muscle wasting and atrophy, not elsewhere classified, left thigh: Secondary | ICD-10-CM | POA: Diagnosis not present

## 2023-07-21 DIAGNOSIS — R2689 Other abnormalities of gait and mobility: Secondary | ICD-10-CM | POA: Diagnosis not present

## 2023-07-21 DIAGNOSIS — M62551 Muscle wasting and atrophy, not elsewhere classified, right thigh: Secondary | ICD-10-CM | POA: Diagnosis not present

## 2023-07-21 DIAGNOSIS — R2681 Unsteadiness on feet: Secondary | ICD-10-CM | POA: Diagnosis not present

## 2023-07-25 DIAGNOSIS — M62511 Muscle wasting and atrophy, not elsewhere classified, right shoulder: Secondary | ICD-10-CM | POA: Diagnosis not present

## 2023-07-25 DIAGNOSIS — M62512 Muscle wasting and atrophy, not elsewhere classified, left shoulder: Secondary | ICD-10-CM | POA: Diagnosis not present

## 2023-07-25 DIAGNOSIS — R488 Other symbolic dysfunctions: Secondary | ICD-10-CM | POA: Diagnosis not present

## 2023-07-25 DIAGNOSIS — R2681 Unsteadiness on feet: Secondary | ICD-10-CM | POA: Diagnosis not present

## 2023-07-26 DIAGNOSIS — M62512 Muscle wasting and atrophy, not elsewhere classified, left shoulder: Secondary | ICD-10-CM | POA: Diagnosis not present

## 2023-07-26 DIAGNOSIS — R2681 Unsteadiness on feet: Secondary | ICD-10-CM | POA: Diagnosis not present

## 2023-07-26 DIAGNOSIS — M62511 Muscle wasting and atrophy, not elsewhere classified, right shoulder: Secondary | ICD-10-CM | POA: Diagnosis not present

## 2023-07-26 DIAGNOSIS — R488 Other symbolic dysfunctions: Secondary | ICD-10-CM | POA: Diagnosis not present

## 2023-07-27 DIAGNOSIS — R2689 Other abnormalities of gait and mobility: Secondary | ICD-10-CM | POA: Diagnosis not present

## 2023-07-27 DIAGNOSIS — R2681 Unsteadiness on feet: Secondary | ICD-10-CM | POA: Diagnosis not present

## 2023-07-27 DIAGNOSIS — M62551 Muscle wasting and atrophy, not elsewhere classified, right thigh: Secondary | ICD-10-CM | POA: Diagnosis not present

## 2023-07-27 DIAGNOSIS — M62552 Muscle wasting and atrophy, not elsewhere classified, left thigh: Secondary | ICD-10-CM | POA: Diagnosis not present

## 2023-07-29 DIAGNOSIS — R488 Other symbolic dysfunctions: Secondary | ICD-10-CM | POA: Diagnosis not present

## 2023-07-29 DIAGNOSIS — R2681 Unsteadiness on feet: Secondary | ICD-10-CM | POA: Diagnosis not present

## 2023-07-29 DIAGNOSIS — M62552 Muscle wasting and atrophy, not elsewhere classified, left thigh: Secondary | ICD-10-CM | POA: Diagnosis not present

## 2023-07-29 DIAGNOSIS — M62512 Muscle wasting and atrophy, not elsewhere classified, left shoulder: Secondary | ICD-10-CM | POA: Diagnosis not present

## 2023-07-29 DIAGNOSIS — R2689 Other abnormalities of gait and mobility: Secondary | ICD-10-CM | POA: Diagnosis not present

## 2023-07-29 DIAGNOSIS — M62551 Muscle wasting and atrophy, not elsewhere classified, right thigh: Secondary | ICD-10-CM | POA: Diagnosis not present

## 2023-07-29 DIAGNOSIS — M62511 Muscle wasting and atrophy, not elsewhere classified, right shoulder: Secondary | ICD-10-CM | POA: Diagnosis not present

## 2023-08-01 DIAGNOSIS — R488 Other symbolic dysfunctions: Secondary | ICD-10-CM | POA: Diagnosis not present

## 2023-08-01 DIAGNOSIS — M62512 Muscle wasting and atrophy, not elsewhere classified, left shoulder: Secondary | ICD-10-CM | POA: Diagnosis not present

## 2023-08-01 DIAGNOSIS — R2681 Unsteadiness on feet: Secondary | ICD-10-CM | POA: Diagnosis not present

## 2023-08-01 DIAGNOSIS — M62511 Muscle wasting and atrophy, not elsewhere classified, right shoulder: Secondary | ICD-10-CM | POA: Diagnosis not present

## 2023-08-02 ENCOUNTER — Ambulatory Visit (INDEPENDENT_AMBULATORY_CARE_PROVIDER_SITE_OTHER): Payer: Medicare Other | Admitting: Physician Assistant

## 2023-08-02 ENCOUNTER — Encounter: Payer: Self-pay | Admitting: Physician Assistant

## 2023-08-02 VITALS — BP 130/75 | HR 86 | Resp 20

## 2023-08-02 DIAGNOSIS — F03A0 Unspecified dementia, mild, without behavioral disturbance, psychotic disturbance, mood disturbance, and anxiety: Secondary | ICD-10-CM

## 2023-08-02 DIAGNOSIS — R2681 Unsteadiness on feet: Secondary | ICD-10-CM | POA: Diagnosis not present

## 2023-08-02 DIAGNOSIS — R1310 Dysphagia, unspecified: Secondary | ICD-10-CM | POA: Diagnosis not present

## 2023-08-02 DIAGNOSIS — R1312 Dysphagia, oropharyngeal phase: Secondary | ICD-10-CM | POA: Diagnosis not present

## 2023-08-02 DIAGNOSIS — R4789 Other speech disturbances: Secondary | ICD-10-CM | POA: Diagnosis not present

## 2023-08-02 DIAGNOSIS — R488 Other symbolic dysfunctions: Secondary | ICD-10-CM | POA: Diagnosis not present

## 2023-08-02 DIAGNOSIS — R2689 Other abnormalities of gait and mobility: Secondary | ICD-10-CM | POA: Diagnosis not present

## 2023-08-02 DIAGNOSIS — M62552 Muscle wasting and atrophy, not elsewhere classified, left thigh: Secondary | ICD-10-CM | POA: Diagnosis not present

## 2023-08-02 DIAGNOSIS — M62551 Muscle wasting and atrophy, not elsewhere classified, right thigh: Secondary | ICD-10-CM | POA: Diagnosis not present

## 2023-08-02 MED ORDER — DIVALPROEX SODIUM 125 MG PO DR TAB: 125.0000 mg | DELAYED_RELEASE_TABLET | Freq: Every evening | ORAL | 11 refills | Status: DC

## 2023-08-02 NOTE — Progress Notes (Signed)
Assessment/Plan:   Dementia of unclear etiology  Mallory Estrada is a delightful 82 y.o. RH female with a history of arthritis, atrial fibrillation on Eliquis, depression, hyperlipidemia, hypothyroidism, ophthalmoplegic migraine, bradycardia, chronic back pain status post multiple procedures, OSA not on CPAP, seen today in follow up for memory loss. She has a history of dementia of unclear etiology as per neuropsychological evaluation 09/2022, although AD was not suspected at the time. DAT scan was negative as well. She is not on antidementia medications, she did not tolerate memantine and she has bradycardia, for which she would not be a candidate for ACHI. She is now on ALF and is wheelchair bound. Memory is stable. She is not interested in repeating neuropsychological testing. Memory id fairly stable. She has sundowning episodes, daughter would like to try agents such as Depakote.     Follow up in  6 months. Start Depakote 125 mg,Take 1 tab at night , may increase to 1 tab twice a day if needed, side effects discussed  Continue PT/OT/ST Recommend good control of her cardiovascular risk factors, continue Eliquis Continue to control mood as per PCP. Continue Zoloft Follow up sleep apnea       Subjective:    This patient is accompanied in the office by her daughter  who supplements the history.  Previous records as well as any outside records available were reviewed prior to todays visit. Patient was last seen on 10/04/22    Any changes in memory since last visit? " Some good days and some less sharp days". "It has changed, I forget things easily" She reports that " short term memory may be worse than long-term memory ". repeats oneself?  Endorsed, especially with appointments." She looks at a clock but does not know what that means" Disoriented when walking into a room?  Sometimes she feels that the room is rearranged.    Leaving objects in unusual places?  May misplace things but not  in unusual places  She is non ambulatory, on a wheelchair.   Wandering behavior?  denies   Any personality changes since last visit? As before, at night she may be more anxious, may have sundownings.** Any worsening depression?:  Denies. " My nickname is smiley" Hallucinations or paranoia?  Denies.  She may be screaming at night but she is not aware of it. She may feel at times that her feet are tied. Seizures? Denies.    Any sleep changes?  She reports that she sleeps fairly well, but does have memories of talking to some people. Denies vivid dreams, REM behavior or sleepwalking   Sleep apnea?   Endorsed, but not on CPAP  Any hygiene concerns? Denies.  Independent of bathing and dressing?  Endorsed  Does the patient needs help with medications? Facility is in charge   Who is in charge of the finances?  Daughter is in charge     Any changes in appetite?  Denies.     Patient have trouble swallowing? As before, with liquids she may have some trouble. She sips with a straw, is on ST  Does the patient cook? No Any headaches?   Denies.  "Been better lately" Chronic back pain ? She has chronic back and neck pain.  Ambulates with difficulty?. She uses a wheelchair. She does PT and OT    Recent falls or head injuries? denies   Unilateral weakness, numbness or tingling? She has chronic paresthesias.    Any tremors?  Sometimes,  as before, "when  I try to grab a pen" and occasionally  she drops stuff as before, no hypographia or hypophonia.  Any anosmia?  Denies   Any incontinence of urine? She has recurrent UTIs, followed by urology. Any bowel dysfunction?  Occasionally she may become incontinent    Patient lives in Assisted Living  at [facility].   Does the patient drive? No longer drives    DaT scan 12/2022  normal results. No PD or LBD   PREVIOUS MEDICATIONS:   CURRENT MEDICATIONS:  Outpatient Encounter Medications as of 08/02/2023  Medication Sig   acetaminophen (TYLENOL) 500 MG tablet  Take 500 mg by mouth every 6 (six) hours as needed.   apixaban (ELIQUIS) 5 MG TABS tablet Take 1 tablet (5 mg total) by mouth 2 (two) times daily.   atorvastatin (LIPITOR) 10 MG tablet TAKE 1 TABLET DAILY   Cholecalciferol (VITAMIN D3) 125 MCG (5000 UT) CAPS Take by mouth.   cyclobenzaprine (FLEXERIL) 5 MG tablet Take 1 tablet (5 mg total) by mouth 3 (three) times daily as needed for muscle spasms.   divalproex (DEPAKOTE) 125 MG DR tablet Take 1 tablet (125 mg total) by mouth at bedtime.   estradiol (ESTRING) 7.5 MCG/24HR vaginal ring Place 2 mg vaginally every 3 (three) months. follow package directions   furosemide (LASIX) 20 MG tablet TAKE 1 TABLET DAILY EXCEPT SKIP SUNDAYS.  TAKE MONDAY THROUGH SATURDAY   KLOR-CON M20 20 MEQ tablet TAKE 1 TABLET DAILY   levothyroxine (SYNTHROID) 50 MCG tablet TAKE 1 TABLET 30 MINUTES BEFORE BREAKFAST. NEW PRESCRIPTION REQUEST   lidocaine 4 % 1 patch applied for pain as needed on upper right arm/shoulder area every 24 hours   loratadine (CLARITIN) 10 MG tablet Take 1 tablet (10 mg total) by mouth daily.   magnesium gluconate (MAGONATE) 500 MG tablet Take 1 tablet (500 mg total) by mouth every evening.   NONFORMULARY OR COMPOUNDED ITEM vaseline with cocoa butter, applied in leg creases twice a day if needed to prevent undergarment chaffing   NONFORMULARY OR COMPOUNDED ITEM [place] moisture wicking groin powder applied on inner thighs and buttocks after showering, before putting on undergarments.   NONFORMULARY OR COMPOUNDED ITEM moisturizing eye drops. Use 1 drop in each eye daily as needed.   polyethylene glycol powder (GLYCOLAX/MIRALAX) 17 GM/SCOOP powder Take 17 g by mouth daily as needed (for constipation).   progesterone (PROMETRIUM) 100 MG capsule 1 capsule by mouth daily for the 10 days following Estring replacement every 3 months   senna-docusate (SENNA S) 8.6-50 MG tablet Take 2 tablets by mouth daily.   sertraline (ZOLOFT) 25 MG tablet Take 1 tablet (25  mg total) by mouth every evening.   No facility-administered encounter medications on file as of 08/02/2023.        No data to display            01/14/2022    8:00 AM  Montreal Cognitive Assessment   Visuospatial/ Executive (0/5) 1  Naming (0/3) 2  Attention: Read list of digits (0/2) 1  Attention: Read list of letters (0/1) 1  Attention: Serial 7 subtraction starting at 100 (0/3) 1  Language: Repeat phrase (0/2) 2  Language : Fluency (0/1) 0  Abstraction (0/2) 1  Delayed Recall (0/5) 2  Orientation (0/6) 4  Total 15  Adjusted Score (based on education) 15    Objective:     PHYSICAL EXAMINATION:    VITALS:   Vitals:   08/02/23 1052  BP: 130/75  Pulse: 86  Resp: 20  SpO2: 96%    GEN:  The patient appears stated age and is in NAD. HEENT:  Normocephalic, atraumatic.   Neurological examination:  General: NAD, well-groomed, appears stated age. Orientation: The patient is alert. Oriented to person, place and  not to date Cranial nerves: There is good facial symmetry.The speech is fluent and clear, slow . No aphasia or dysarthria. Fund of knowledge is appropriate. Recent and remote memory are impaired. Attention and concentration are reduced.  Able to name objects and repeat phrases.  Hearing is intact to conversational tone.   Sensation: Sensation is intact to light touch throughout Motor: Strength is at least antigravity x4. DTR's 1 /4 in UE/LE     Movement examination: Tone: There is normal tone in the UE/LE Abnormal movements: very mild intermittent resting tremor.  No myoclonus.  No asterixis.   Coordination:  There is no decremation with RAM's. Normal finger to nose  Gait and Station: She is wheelchair bound, gait not tested.    Thank you for allowing [place] the opportunity to participate in the care of this nice patient. Please do not hesitate to contact us for any questions or concerns.   Total time spent on today's visit was 43 minutes dedicated to this  patient today, preparing to see patient, examining the patient, ordering tests and/or medications and counseling the patient, documenting clinical information in the EHR or other health record, independently interpreting results and communicating results to the patient/family, discussing treatment and goals, answering patient's questions and coordinating care.  Cc:  Joaquim Nam, MD  [name] 08/02/2023 12:41 PM

## 2023-08-02 NOTE — Patient Instructions (Addendum)
It was a pleasure to see you today at our office.   Recommendations:  Continue Speech therapy   April 11 at 11:30 months follow up for memory  Start Depakote 125 mg 1 tab at night, may increase to 1 tab 125 mg twice a day for sundowning, hallucinations side effects discussed.    RECOMMENDATIONS FOR ALL PATIENTS WITH MEMORY PROBLEMS: 1. Continue to exercise (Recommend 30 minutes of walking everyday, or 3 hours every week) 2. Increase social interactions - continue going to [place] and enjoy social gatherings with friends and family 3. Eat healthy, avoid fried foods and eat more fruits and vegetables 4. Maintain adequate blood pressure, blood sugar, and blood cholesterol level. Reducing the risk of stroke and cardiovascular disease also helps promoting better memory. 5. Avoid stressful situations. Live a simple life and avoid aggravations. Organize your time and prepare for the next day in anticipation. 6. Sleep well, avoid any interruptions of sleep and avoid any distractions in the bedroom that may interfere with adequate sleep quality 7. Avoid sugar, avoid sweets as there is a strong link between excessive sugar intake, diabetes, and cognitive impairment We discussed the Mediterranean diet, which has been shown to help patients reduce the risk of progressive memory disorders and reduces cardiovascular risk. This includes eating fish, eat fruits and green leafy vegetables, nuts like almonds and hazelnuts, walnuts, and also use olive oil. Avoid fast foods and fried foods as much as possible. Avoid sweets and sugar as sugar use has been linked to worsening of memory function.  There is always a concern of gradual progression of memory problems. If this is the case, then we may need to adjust level of care according to patient needs. Support, both to the patient and caregiver, should then be put into place.     FALL PRECAUTIONS: Be cautious when walking. Scan the area for obstacles that may  increase the risk of trips and falls. When getting up in the mornings, sit up at the edge of the bed for a few minutes before getting out of bed. Consider elevating the bed at the head end to avoid drop of blood pressure when getting up. Walk always in a well-lit room (use night lights in the walls). Avoid area rugs or power cords from appliances in the middle of the walkways. Use a walker or a cane if necessary and consider physical therapy for balance exercise. Get your eyesight checked regularly.  FINANCIAL OVERSIGHT: Supervision, especially oversight when making financial decisions or transactions is also recommended.  HOME SAFETY: Consider the safety of the kitchen when operating appliances like stoves, microwave oven, and blender. Consider having supervision and share cooking responsibilities until no longer able to participate in those. Accidents with firearms and other hazards in the house should be identified and addressed as well.   ABILITY TO BE LEFT ALONE: If patient is unable to contact 911 operator, consider using LifeLine, or when the need is there, arrange for someone to stay with patients. Smoking is a fire hazard, consider supervision or cessation. Risk of wandering should be assessed by caregiver and if detected at any point, supervision and safe proof recommendations should be instituted.  MEDICATION SUPERVISION: Inability to self-administer medication needs to be constantly addressed. Implement a mechanism to ensure safe administration of the medications.    Mediterranean Diet A Mediterranean diet refers to food and lifestyle choices that are based on the traditions of countries located on the [place]. This way of eating has been shown  to help prevent certain conditions and improve outcomes for people who have chronic diseases, like kidney disease and heart disease. What are tips for following this plan? Lifestyle  Cook and eat meals together with your family, when  possible. Drink enough fluid to keep your urine clear or pale yellow. Be physically active every day. This includes: Aerobic exercise like running or swimming. Leisure activities like gardening, walking, or housework. Get 7-8 hours of sleep each night. If recommended by your health care provider, drink red wine in moderation. This means 1 glass a day for nonpregnant women and 2 glasses a day for men. A glass of wine equals 5 oz (150 mL). Reading food labels  Check the serving size of packaged foods. For foods such as rice and pasta, the serving size refers to the amount of cooked product, not dry. Check the total fat in packaged foods. Avoid foods that have saturated fat or trans fats. Check the ingredients list for added sugars, such as corn syrup. Shopping  At the grocery store, buy most of your food from the areas near the walls of the store. This includes: Fresh fruits and vegetables (produce). Grains, beans, nuts, and seeds. Some of these may be available in unpackaged forms or large amounts (in bulk). Fresh seafood. Poultry and eggs. Low-fat dairy products. Buy whole ingredients instead of prepackaged foods. Buy fresh fruits and vegetables in-season from local farmers markets. Buy frozen fruits and vegetables in resealable bags. If you do not have access to quality fresh seafood, buy precooked frozen shrimp or canned fish, such as tuna, salmon, or sardines. Buy small amounts of raw or cooked vegetables, salads, or olives from the deli or salad bar at your store. Stock your pantry so you always have certain foods on hand, such as olive oil, canned tuna, canned tomatoes, rice, pasta, and beans. Cooking  Cook foods with extra-virgin olive oil instead of using butter or other vegetable oils. Have meat as a side dish, and have vegetables or grains as your main dish. This means having meat in small portions or adding small amounts of meat to foods like pasta or stew. Use beans or  vegetables instead of meat in common dishes like chili or lasagna. Experiment with different cooking methods. Try roasting or broiling vegetables instead of steaming or sauteing them. Add frozen vegetables to soups, stews, pasta, or rice. Add nuts or seeds for added healthy fat at each meal. You can add these to yogurt, salads, or vegetable dishes. Marinate fish or vegetables using olive oil, lemon juice, garlic, and fresh herbs. Meal planning  Plan to eat 1 vegetarian meal one day each week. Try to work up to 2 vegetarian meals, if possible. Eat seafood 2 or more times a week. Have healthy snacks readily available, such as: Vegetable sticks with hummus. Greek yogurt. Fruit and nut trail mix. Eat balanced meals throughout the week. This includes: Fruit: 2-3 servings a day Vegetables: 4-5 servings a day Low-fat dairy: 2 servings a day Fish, poultry, or lean meat: 1 serving a day Beans and legumes: 2 or more servings a week Nuts and seeds: 1-2 servings a day Whole grains: 6-8 servings a day Extra-virgin olive oil: 3-4 servings a day Limit red meat and sweets to only a few servings a month What are my food choices? Mediterranean diet Recommended Grains: Whole-grain pasta. Brown rice. Bulgar wheat. Polenta. Couscous. Whole-wheat bread. Orpah Cobb. Vegetables: Artichokes. Beets. Broccoli. Cabbage. Carrots. Eggplant. Green beans. Chard. Kale. Spinach. Onions. Leeks. Peas.  Squash. Tomatoes. Peppers. Radishes. Fruits: Apples. Apricots. Avocado. Berries. Bananas. Cherries. Dates. Figs. Grapes. Lemons. Melon. Oranges. Peaches. Plums. Pomegranate. Meats and other protein foods: Beans. Almonds. Sunflower seeds. Pine nuts. Peanuts. Cod. Salmon. Scallops. Shrimp. Tuna. Tilapia. Clams. Oysters. Eggs. Dairy: Low-fat milk. Cheese. Greek yogurt. Beverages: Water. Red wine. Herbal tea. Fats and oils: Extra virgin olive oil. Avocado oil. Grape seed oil. Sweets and desserts: [place] yogurt with honey.  Baked apples. Poached pears. Trail mix. Seasoning and other foods: Basil. Cilantro. Coriander. Cumin. Mint. Parsley. Sage. Rosemary. Tarragon. Garlic. Oregano. Thyme. Pepper. Balsalmic vinegar. Tahini. Hummus. Tomato sauce. Olives. Mushrooms. Limit these Grains: Prepackaged pasta or rice dishes. Prepackaged cereal with added sugar. Vegetables: Deep fried potatoes (french fries). Fruits: Fruit canned in syrup. Meats and other protein foods: Beef. Pork. Lamb. Poultry with skin. Hot dogs. Tomasa Blase. Dairy: Ice cream. Sour cream. Whole milk. Beverages: Juice. Sugar-sweetened soft drinks. Beer. Liquor and spirits. Fats and oils: Butter. Canola oil. Vegetable oil. Beef fat (tallow). Lard. Sweets and desserts: Cookies. Cakes. Pies. Candy. Seasoning and other foods: Mayonnaise. Premade sauces and marinades. The items listed may not be a complete list. Talk with your dietitian about what dietary choices are right for you. Summary The Mediterranean diet includes both food and lifestyle choices. Eat a variety of fresh fruits and vegetables, beans, nuts, seeds, and whole grains. Limit the amount of red meat and sweets that you eat. Talk with your health care provider about whether it is safe for you to drink red wine in moderation. This means 1 glass a day for nonpregnant women and 2 glasses a day for men. A glass of wine equals 5 oz (150 mL). This information is not intended to replace advice given to you by your health care provider. Make sure you discuss any questions you have with your health care provider. Document Released: 06/10/2016 Document Revised: 07/13/2016 Document Reviewed: 06/10/2016 Elsevier Interactive Patient Education  2017 [employer].  We have sent a referral to [facility] for your MRI and they will call you directly to schedule your appointment. They are located at [address] [facility]. If you need to contact them directly please call [phone].

## 2023-08-03 DIAGNOSIS — R2681 Unsteadiness on feet: Secondary | ICD-10-CM | POA: Diagnosis not present

## 2023-08-03 DIAGNOSIS — R488 Other symbolic dysfunctions: Secondary | ICD-10-CM | POA: Diagnosis not present

## 2023-08-03 DIAGNOSIS — M62512 Muscle wasting and atrophy, not elsewhere classified, left shoulder: Secondary | ICD-10-CM | POA: Diagnosis not present

## 2023-08-03 DIAGNOSIS — M62511 Muscle wasting and atrophy, not elsewhere classified, right shoulder: Secondary | ICD-10-CM | POA: Diagnosis not present

## 2023-08-05 DIAGNOSIS — M62511 Muscle wasting and atrophy, not elsewhere classified, right shoulder: Secondary | ICD-10-CM | POA: Diagnosis not present

## 2023-08-05 DIAGNOSIS — R488 Other symbolic dysfunctions: Secondary | ICD-10-CM | POA: Diagnosis not present

## 2023-08-05 DIAGNOSIS — R2689 Other abnormalities of gait and mobility: Secondary | ICD-10-CM | POA: Diagnosis not present

## 2023-08-05 DIAGNOSIS — M62512 Muscle wasting and atrophy, not elsewhere classified, left shoulder: Secondary | ICD-10-CM | POA: Diagnosis not present

## 2023-08-05 DIAGNOSIS — M62551 Muscle wasting and atrophy, not elsewhere classified, right thigh: Secondary | ICD-10-CM | POA: Diagnosis not present

## 2023-08-05 DIAGNOSIS — M62552 Muscle wasting and atrophy, not elsewhere classified, left thigh: Secondary | ICD-10-CM | POA: Diagnosis not present

## 2023-08-05 DIAGNOSIS — R2681 Unsteadiness on feet: Secondary | ICD-10-CM | POA: Diagnosis not present

## 2023-08-07 ENCOUNTER — Encounter: Payer: Self-pay | Admitting: Physician Assistant

## 2023-08-08 DIAGNOSIS — M62512 Muscle wasting and atrophy, not elsewhere classified, left shoulder: Secondary | ICD-10-CM | POA: Diagnosis not present

## 2023-08-08 DIAGNOSIS — Z7901 Long term (current) use of anticoagulants: Secondary | ICD-10-CM | POA: Diagnosis not present

## 2023-08-08 DIAGNOSIS — R488 Other symbolic dysfunctions: Secondary | ICD-10-CM | POA: Diagnosis not present

## 2023-08-08 DIAGNOSIS — I480 Paroxysmal atrial fibrillation: Secondary | ICD-10-CM | POA: Diagnosis not present

## 2023-08-08 DIAGNOSIS — K5901 Slow transit constipation: Secondary | ICD-10-CM | POA: Diagnosis not present

## 2023-08-08 DIAGNOSIS — F5101 Primary insomnia: Secondary | ICD-10-CM | POA: Diagnosis not present

## 2023-08-08 DIAGNOSIS — R2681 Unsteadiness on feet: Secondary | ICD-10-CM | POA: Diagnosis not present

## 2023-08-08 DIAGNOSIS — M62511 Muscle wasting and atrophy, not elsewhere classified, right shoulder: Secondary | ICD-10-CM | POA: Diagnosis not present

## 2023-08-09 DIAGNOSIS — R4789 Other speech disturbances: Secondary | ICD-10-CM | POA: Diagnosis not present

## 2023-08-09 DIAGNOSIS — R2681 Unsteadiness on feet: Secondary | ICD-10-CM | POA: Diagnosis not present

## 2023-08-09 DIAGNOSIS — M62552 Muscle wasting and atrophy, not elsewhere classified, left thigh: Secondary | ICD-10-CM | POA: Diagnosis not present

## 2023-08-09 DIAGNOSIS — R1312 Dysphagia, oropharyngeal phase: Secondary | ICD-10-CM | POA: Diagnosis not present

## 2023-08-09 DIAGNOSIS — R1310 Dysphagia, unspecified: Secondary | ICD-10-CM | POA: Diagnosis not present

## 2023-08-09 DIAGNOSIS — R488 Other symbolic dysfunctions: Secondary | ICD-10-CM | POA: Diagnosis not present

## 2023-08-09 DIAGNOSIS — M62551 Muscle wasting and atrophy, not elsewhere classified, right thigh: Secondary | ICD-10-CM | POA: Diagnosis not present

## 2023-08-09 DIAGNOSIS — R2689 Other abnormalities of gait and mobility: Secondary | ICD-10-CM | POA: Diagnosis not present

## 2023-08-10 DIAGNOSIS — M62512 Muscle wasting and atrophy, not elsewhere classified, left shoulder: Secondary | ICD-10-CM | POA: Diagnosis not present

## 2023-08-10 DIAGNOSIS — R2681 Unsteadiness on feet: Secondary | ICD-10-CM | POA: Diagnosis not present

## 2023-08-10 DIAGNOSIS — R488 Other symbolic dysfunctions: Secondary | ICD-10-CM | POA: Diagnosis not present

## 2023-08-10 DIAGNOSIS — M62511 Muscle wasting and atrophy, not elsewhere classified, right shoulder: Secondary | ICD-10-CM | POA: Diagnosis not present

## 2023-08-11 DIAGNOSIS — M62552 Muscle wasting and atrophy, not elsewhere classified, left thigh: Secondary | ICD-10-CM | POA: Diagnosis not present

## 2023-08-11 DIAGNOSIS — R488 Other symbolic dysfunctions: Secondary | ICD-10-CM | POA: Diagnosis not present

## 2023-08-11 DIAGNOSIS — R1310 Dysphagia, unspecified: Secondary | ICD-10-CM | POA: Diagnosis not present

## 2023-08-11 DIAGNOSIS — R2681 Unsteadiness on feet: Secondary | ICD-10-CM | POA: Diagnosis not present

## 2023-08-11 DIAGNOSIS — R1312 Dysphagia, oropharyngeal phase: Secondary | ICD-10-CM | POA: Diagnosis not present

## 2023-08-11 DIAGNOSIS — M62551 Muscle wasting and atrophy, not elsewhere classified, right thigh: Secondary | ICD-10-CM | POA: Diagnosis not present

## 2023-08-11 DIAGNOSIS — R4789 Other speech disturbances: Secondary | ICD-10-CM | POA: Diagnosis not present

## 2023-08-11 DIAGNOSIS — R2689 Other abnormalities of gait and mobility: Secondary | ICD-10-CM | POA: Diagnosis not present

## 2023-08-12 DIAGNOSIS — M62512 Muscle wasting and atrophy, not elsewhere classified, left shoulder: Secondary | ICD-10-CM | POA: Diagnosis not present

## 2023-08-12 DIAGNOSIS — R488 Other symbolic dysfunctions: Secondary | ICD-10-CM | POA: Diagnosis not present

## 2023-08-12 DIAGNOSIS — R1312 Dysphagia, oropharyngeal phase: Secondary | ICD-10-CM | POA: Diagnosis not present

## 2023-08-12 DIAGNOSIS — M62511 Muscle wasting and atrophy, not elsewhere classified, right shoulder: Secondary | ICD-10-CM | POA: Diagnosis not present

## 2023-08-12 DIAGNOSIS — R2681 Unsteadiness on feet: Secondary | ICD-10-CM | POA: Diagnosis not present

## 2023-08-12 DIAGNOSIS — R4789 Other speech disturbances: Secondary | ICD-10-CM | POA: Diagnosis not present

## 2023-08-12 DIAGNOSIS — R1310 Dysphagia, unspecified: Secondary | ICD-10-CM | POA: Diagnosis not present

## 2023-08-15 DIAGNOSIS — R2681 Unsteadiness on feet: Secondary | ICD-10-CM | POA: Diagnosis not present

## 2023-08-15 DIAGNOSIS — M62511 Muscle wasting and atrophy, not elsewhere classified, right shoulder: Secondary | ICD-10-CM | POA: Diagnosis not present

## 2023-08-15 DIAGNOSIS — R488 Other symbolic dysfunctions: Secondary | ICD-10-CM | POA: Diagnosis not present

## 2023-08-15 DIAGNOSIS — M62512 Muscle wasting and atrophy, not elsewhere classified, left shoulder: Secondary | ICD-10-CM | POA: Diagnosis not present

## 2023-08-16 DIAGNOSIS — R488 Other symbolic dysfunctions: Secondary | ICD-10-CM | POA: Diagnosis not present

## 2023-08-16 DIAGNOSIS — R1312 Dysphagia, oropharyngeal phase: Secondary | ICD-10-CM | POA: Diagnosis not present

## 2023-08-16 DIAGNOSIS — R1310 Dysphagia, unspecified: Secondary | ICD-10-CM | POA: Diagnosis not present

## 2023-08-16 DIAGNOSIS — R4789 Other speech disturbances: Secondary | ICD-10-CM | POA: Diagnosis not present

## 2023-08-17 DIAGNOSIS — G8222 Paraplegia, incomplete: Secondary | ICD-10-CM | POA: Diagnosis not present

## 2023-08-17 DIAGNOSIS — Z87440 Personal history of urinary (tract) infections: Secondary | ICD-10-CM | POA: Diagnosis not present

## 2023-08-17 DIAGNOSIS — I69351 Hemiplegia and hemiparesis following cerebral infarction affecting right dominant side: Secondary | ICD-10-CM | POA: Diagnosis not present

## 2023-08-17 DIAGNOSIS — I480 Paroxysmal atrial fibrillation: Secondary | ICD-10-CM | POA: Diagnosis not present

## 2023-08-17 DIAGNOSIS — N390 Urinary tract infection, site not specified: Secondary | ICD-10-CM | POA: Diagnosis not present

## 2023-08-18 DIAGNOSIS — R488 Other symbolic dysfunctions: Secondary | ICD-10-CM | POA: Diagnosis not present

## 2023-08-18 DIAGNOSIS — R2681 Unsteadiness on feet: Secondary | ICD-10-CM | POA: Diagnosis not present

## 2023-08-18 DIAGNOSIS — M62552 Muscle wasting and atrophy, not elsewhere classified, left thigh: Secondary | ICD-10-CM | POA: Diagnosis not present

## 2023-08-18 DIAGNOSIS — R1310 Dysphagia, unspecified: Secondary | ICD-10-CM | POA: Diagnosis not present

## 2023-08-18 DIAGNOSIS — R2689 Other abnormalities of gait and mobility: Secondary | ICD-10-CM | POA: Diagnosis not present

## 2023-08-18 DIAGNOSIS — M62551 Muscle wasting and atrophy, not elsewhere classified, right thigh: Secondary | ICD-10-CM | POA: Diagnosis not present

## 2023-08-18 DIAGNOSIS — R4789 Other speech disturbances: Secondary | ICD-10-CM | POA: Diagnosis not present

## 2023-08-18 DIAGNOSIS — R1312 Dysphagia, oropharyngeal phase: Secondary | ICD-10-CM | POA: Diagnosis not present

## 2023-08-19 DIAGNOSIS — M62511 Muscle wasting and atrophy, not elsewhere classified, right shoulder: Secondary | ICD-10-CM | POA: Diagnosis not present

## 2023-08-19 DIAGNOSIS — R488 Other symbolic dysfunctions: Secondary | ICD-10-CM | POA: Diagnosis not present

## 2023-08-19 DIAGNOSIS — M62512 Muscle wasting and atrophy, not elsewhere classified, left shoulder: Secondary | ICD-10-CM | POA: Diagnosis not present

## 2023-08-19 DIAGNOSIS — R2681 Unsteadiness on feet: Secondary | ICD-10-CM | POA: Diagnosis not present

## 2023-08-23 DIAGNOSIS — R488 Other symbolic dysfunctions: Secondary | ICD-10-CM | POA: Diagnosis not present

## 2023-08-23 DIAGNOSIS — R2681 Unsteadiness on feet: Secondary | ICD-10-CM | POA: Diagnosis not present

## 2023-08-23 DIAGNOSIS — E038 Other specified hypothyroidism: Secondary | ICD-10-CM | POA: Diagnosis not present

## 2023-08-23 DIAGNOSIS — M62512 Muscle wasting and atrophy, not elsewhere classified, left shoulder: Secondary | ICD-10-CM | POA: Diagnosis not present

## 2023-08-23 DIAGNOSIS — M1611 Unilateral primary osteoarthritis, right hip: Secondary | ICD-10-CM | POA: Diagnosis not present

## 2023-08-23 DIAGNOSIS — M62511 Muscle wasting and atrophy, not elsewhere classified, right shoulder: Secondary | ICD-10-CM | POA: Diagnosis not present

## 2023-08-25 ENCOUNTER — Encounter: Payer: Self-pay | Admitting: Physician Assistant

## 2023-08-26 ENCOUNTER — Emergency Department (HOSPITAL_COMMUNITY): Payer: Medicare Other

## 2023-08-26 ENCOUNTER — Observation Stay (HOSPITAL_COMMUNITY)
Admission: EM | Admit: 2023-08-26 | Discharge: 2023-08-29 | Disposition: A | Discharge status: Skilled Nursing Facility | Payer: Medicare Other | Attending: Infectious Diseases | Admitting: Infectious Diseases

## 2023-08-26 DIAGNOSIS — M62512 Muscle wasting and atrophy, not elsewhere classified, left shoulder: Secondary | ICD-10-CM | POA: Diagnosis not present

## 2023-08-26 DIAGNOSIS — F0390 Unspecified dementia without behavioral disturbance: Secondary | ICD-10-CM | POA: Diagnosis not present

## 2023-08-26 DIAGNOSIS — Z96651 Presence of right artificial knee joint: Secondary | ICD-10-CM | POA: Diagnosis not present

## 2023-08-26 DIAGNOSIS — M4312 Spondylolisthesis, cervical region: Secondary | ICD-10-CM | POA: Diagnosis not present

## 2023-08-26 DIAGNOSIS — I4891 Unspecified atrial fibrillation: Secondary | ICD-10-CM | POA: Insufficient documentation

## 2023-08-26 DIAGNOSIS — I5030 Unspecified diastolic (congestive) heart failure: Secondary | ICD-10-CM | POA: Insufficient documentation

## 2023-08-26 DIAGNOSIS — S0003XA Contusion of scalp, initial encounter: Secondary | ICD-10-CM | POA: Diagnosis not present

## 2023-08-26 DIAGNOSIS — N952 Postmenopausal atrophic vaginitis: Secondary | ICD-10-CM | POA: Insufficient documentation

## 2023-08-26 DIAGNOSIS — M25551 Pain in right hip: Secondary | ICD-10-CM | POA: Diagnosis not present

## 2023-08-26 DIAGNOSIS — W19XXXA Unspecified fall, initial encounter: Secondary | ICD-10-CM

## 2023-08-26 DIAGNOSIS — N390 Urinary tract infection, site not specified: Secondary | ICD-10-CM | POA: Insufficient documentation

## 2023-08-26 DIAGNOSIS — Z7901 Long term (current) use of anticoagulants: Secondary | ICD-10-CM

## 2023-08-26 DIAGNOSIS — Y9301 Activity, walking, marching and hiking: Secondary | ICD-10-CM | POA: Insufficient documentation

## 2023-08-26 DIAGNOSIS — S0990XA Unspecified injury of head, initial encounter: Secondary | ICD-10-CM | POA: Diagnosis not present

## 2023-08-26 DIAGNOSIS — M25559 Pain in unspecified hip: Secondary | ICD-10-CM | POA: Diagnosis not present

## 2023-08-26 DIAGNOSIS — R2689 Other abnormalities of gait and mobility: Secondary | ICD-10-CM | POA: Insufficient documentation

## 2023-08-26 DIAGNOSIS — L03115 Cellulitis of right lower limb: Secondary | ICD-10-CM | POA: Diagnosis not present

## 2023-08-26 DIAGNOSIS — F03A0 Unspecified dementia, mild, without behavioral disturbance, psychotic disturbance, mood disturbance, and anxiety: Secondary | ICD-10-CM | POA: Diagnosis present

## 2023-08-26 DIAGNOSIS — R488 Other symbolic dysfunctions: Secondary | ICD-10-CM | POA: Diagnosis not present

## 2023-08-26 DIAGNOSIS — R519 Headache, unspecified: Secondary | ICD-10-CM | POA: Diagnosis not present

## 2023-08-26 DIAGNOSIS — M7731 Calcaneal spur, right foot: Secondary | ICD-10-CM | POA: Diagnosis not present

## 2023-08-26 DIAGNOSIS — M25572 Pain in left ankle and joints of left foot: Secondary | ICD-10-CM | POA: Diagnosis not present

## 2023-08-26 DIAGNOSIS — Z981 Arthrodesis status: Secondary | ICD-10-CM | POA: Diagnosis not present

## 2023-08-26 DIAGNOSIS — M79651 Pain in right thigh: Secondary | ICD-10-CM | POA: Diagnosis not present

## 2023-08-26 DIAGNOSIS — Z471 Aftercare following joint replacement surgery: Secondary | ICD-10-CM | POA: Diagnosis not present

## 2023-08-26 DIAGNOSIS — Z96643 Presence of artificial hip joint, bilateral: Secondary | ICD-10-CM | POA: Diagnosis not present

## 2023-08-26 DIAGNOSIS — R296 Repeated falls: Secondary | ICD-10-CM

## 2023-08-26 DIAGNOSIS — R2681 Unsteadiness on feet: Secondary | ICD-10-CM | POA: Diagnosis not present

## 2023-08-26 DIAGNOSIS — M5030 Other cervical disc degeneration, unspecified cervical region: Secondary | ICD-10-CM | POA: Diagnosis not present

## 2023-08-26 DIAGNOSIS — I110 Hypertensive heart disease with heart failure: Secondary | ICD-10-CM | POA: Diagnosis not present

## 2023-08-26 DIAGNOSIS — M79661 Pain in right lower leg: Secondary | ICD-10-CM | POA: Diagnosis not present

## 2023-08-26 DIAGNOSIS — Z96641 Presence of right artificial hip joint: Secondary | ICD-10-CM | POA: Diagnosis not present

## 2023-08-26 DIAGNOSIS — R269 Unspecified abnormalities of gait and mobility: Secondary | ICD-10-CM

## 2023-08-26 DIAGNOSIS — M62511 Muscle wasting and atrophy, not elsewhere classified, right shoulder: Secondary | ICD-10-CM | POA: Diagnosis not present

## 2023-08-26 DIAGNOSIS — I10 Essential (primary) hypertension: Secondary | ICD-10-CM | POA: Diagnosis not present

## 2023-08-26 LAB — CBC WITH DIFFERENTIAL/PLATELET
Abs Immature Granulocytes: 0.02 10*3/uL (ref 0.00–0.07)
Basophils Absolute: 0 10*3/uL (ref 0.0–0.1)
Basophils Relative: 1 %
Eosinophils Absolute: 0.1 10*3/uL (ref 0.0–0.5)
Eosinophils Relative: 1 %
HCT: 37 % (ref 36.0–46.0)
Hemoglobin: 12.3 g/dL (ref 12.0–15.0)
Immature Granulocytes: 0 %
Lymphocytes Relative: 16 %
Lymphs Abs: 1 10*3/uL (ref 0.7–4.0)
MCH: 31.2 pg (ref 26.0–34.0)
MCHC: 33.2 g/dL (ref 30.0–36.0)
MCV: 93.9 fL (ref 80.0–100.0)
Monocytes Absolute: 0.5 10*3/uL (ref 0.1–1.0)
Monocytes Relative: 9 %
Neutro Abs: 4.6 10*3/uL (ref 1.7–7.7)
Neutrophils Relative %: 73 %
Platelets: 183 10*3/uL (ref 150–400)
RBC: 3.94 MIL/uL (ref 3.87–5.11)
RDW: 12.9 % (ref 11.5–15.5)
WBC: 6.2 10*3/uL (ref 4.0–10.5)
nRBC: 0 % (ref 0.0–0.2)

## 2023-08-26 LAB — I-STAT CHEM 8, ED
BUN: 28 mg/dL — ABNORMAL HIGH (ref 8–23)
Calcium, Ion: 1.06 mmol/L — ABNORMAL LOW (ref 1.15–1.40)
Chloride: 105 mmol/L (ref 98–111)
Creatinine, Ser: 1.1 mg/dL — ABNORMAL HIGH (ref 0.44–1.00)
Glucose, Bld: 107 mg/dL — ABNORMAL HIGH (ref 70–99)
HCT: 39 % (ref 36.0–46.0)
Hemoglobin: 13.3 g/dL (ref 12.0–15.0)
Potassium: 4 mmol/L (ref 3.5–5.1)
Sodium: 142 mmol/L (ref 135–145)
TCO2: 26 mmol/L (ref 22–32)

## 2023-08-26 MED ORDER — ACETAMINOPHEN 325 MG PO TABS
ORAL_TABLET | ORAL | Status: AC
  Filled 2023-08-26: qty 1

## 2023-08-26 MED ORDER — ACETAMINOPHEN 325 MG PO TABS
650.0000 mg | ORAL_TABLET | Freq: Four times a day (QID) | ORAL | Status: DC | PRN
  Administered 2023-08-26: 650 mg via ORAL
  Filled 2023-08-26: qty 2

## 2023-08-26 MED ORDER — SENNOSIDES-DOCUSATE SODIUM 8.6-50 MG PO TABS
2.0000 | ORAL_TABLET | Freq: Every day | ORAL | Status: DC
  Administered 2023-08-26 – 2023-08-29 (×4): 2 via ORAL
  Filled 2023-08-26 (×4): qty 2

## 2023-08-26 MED ORDER — VITAMIN D 25 MCG (1000 UNIT) PO TABS
5000.0000 [IU] | ORAL_TABLET | Freq: Every day | ORAL | Status: DC
  Administered 2023-08-26 – 2023-08-29 (×4): 5000 [IU] via ORAL
  Filled 2023-08-26 (×4): qty 5

## 2023-08-26 MED ORDER — ACETAMINOPHEN 325 MG PO TABS
650.0000 mg | ORAL_TABLET | Freq: Once | ORAL | Status: AC
Start: 2023-08-26 — End: 2023-08-26
  Administered 2023-08-26: 650 mg via ORAL
  Filled 2023-08-26: qty 2

## 2023-08-26 MED ORDER — LEVOTHYROXINE SODIUM 50 MCG PO TABS
50.0000 ug | ORAL_TABLET | Freq: Every day | ORAL | Status: DC
  Administered 2023-08-27 – 2023-08-29 (×3): 50 ug via ORAL
  Filled 2023-08-26 (×3): qty 1

## 2023-08-26 MED ORDER — AMOXICILLIN-POT CLAVULANATE 875-125 MG PO TABS
1.0000 | ORAL_TABLET | Freq: Two times a day (BID) | ORAL | Status: DC
  Administered 2023-08-27: 1 via ORAL
  Filled 2023-08-26: qty 1

## 2023-08-26 MED ORDER — LIDOCAINE 5 % EX PTCH
1.0000 | MEDICATED_PATCH | CUTANEOUS | Status: DC
  Administered 2023-08-26 – 2023-08-28 (×3): 1 via TRANSDERMAL
  Filled 2023-08-26 (×4): qty 1

## 2023-08-26 MED ORDER — POLYETHYLENE GLYCOL 3350 17 G PO PACK
17.0000 g | PACK | Freq: Every day | ORAL | Status: DC | PRN
  Administered 2023-08-27 – 2023-08-28 (×2): 17 g via ORAL
  Filled 2023-08-26 (×2): qty 1

## 2023-08-26 MED ORDER — MAGNESIUM GLUCONATE 500 MG PO TABS
500.0000 mg | ORAL_TABLET | Freq: Every evening | ORAL | Status: DC
  Administered 2023-08-26 – 2023-08-28 (×3): 500 mg via ORAL
  Filled 2023-08-26 (×5): qty 1

## 2023-08-26 MED ORDER — MORPHINE SULFATE (PF) 2 MG/ML IV SOLN: 0.5000 mg | Freq: Once | INTRAVENOUS | Status: DC

## 2023-08-26 MED ORDER — SODIUM CHLORIDE 0.9 % IV SOLN
1.0000 g | Freq: Once | INTRAVENOUS | Status: AC
  Administered 2023-08-26: 1 g via INTRAVENOUS
  Filled 2023-08-26: qty 10

## 2023-08-26 MED ORDER — ACETAMINOPHEN 325 MG PO TABS
325.0000 mg | ORAL_TABLET | Freq: Once | ORAL | Status: AC
  Administered 2023-08-26: 325 mg via ORAL

## 2023-08-26 MED ORDER — ACETAMINOPHEN 650 MG RE SUPP: 650.0000 mg | Freq: Four times a day (QID) | RECTAL | Status: DC | PRN

## 2023-08-26 MED ORDER — SERTRALINE HCL 50 MG PO TABS
25.0000 mg | ORAL_TABLET | Freq: Every evening | ORAL | Status: DC
  Administered 2023-08-26 – 2023-08-28 (×3): 25 mg via ORAL
  Filled 2023-08-26 (×3): qty 1

## 2023-08-26 MED ORDER — CYCLOBENZAPRINE HCL 10 MG PO TABS
5.0000 mg | ORAL_TABLET | Freq: Three times a day (TID) | ORAL | Status: DC | PRN
  Administered 2023-08-26 – 2023-08-27 (×2): 5 mg via ORAL
  Filled 2023-08-26 (×3): qty 1

## 2023-08-26 MED ORDER — ACETAMINOPHEN 500 MG PO TABS
1000.0000 mg | ORAL_TABLET | Freq: Three times a day (TID) | ORAL | Status: DC
  Administered 2023-08-27 – 2023-08-29 (×8): 1000 mg via ORAL
  Filled 2023-08-26 (×8): qty 2

## 2023-08-26 MED ORDER — LIDOCAINE 5 % EX PTCH
1.0000 | MEDICATED_PATCH | CUTANEOUS | Status: DC
  Administered 2023-08-26 – 2023-08-28 (×3): 1 via TRANSDERMAL
  Filled 2023-08-26 (×3): qty 1

## 2023-08-26 MED ORDER — ATORVASTATIN CALCIUM 10 MG PO TABS
10.0000 mg | ORAL_TABLET | Freq: Every day | ORAL | Status: DC
  Administered 2023-08-26 – 2023-08-29 (×4): 10 mg via ORAL
  Filled 2023-08-26 (×4): qty 1

## 2023-08-26 NOTE — ED Triage Notes (Signed)
Pt BIB [facility] from [facility] for a witnessed mechanical fall on Eliquis. She was using walker with staff to go to bathroom and lost balance and fell back, hitting back of head.  Pt complains of 8/10 pain in hip, pt was complaining of some hip pain prior to the fall but says it is worse now.   Pt does not have any head pain. Pt has a quarter-size hematoma to back of head.  Pt also has old bruising to legs from fall out of bed 5 days ago.  PT was not evaluated for that fall.

## 2023-08-26 NOTE — ED Notes (Signed)
Urine flowing from the pure wick  in cannister

## 2023-08-26 NOTE — ED Notes (Signed)
Pt in xray

## 2023-08-26 NOTE — Progress Notes (Signed)
Orthopedic Tech Progress Note Patient Details:  [name] 1941/05/28 [id number]  Patient ID: Mallory Estrada, female   DOB: June 01, 1941, 82 y.o.   MRN: [id number] Level 2 trauma Tonye Pearson 08/26/2023, 11:37 AM

## 2023-08-26 NOTE — Progress Notes (Signed)
Patient unable to stand for orthostatic vital signs.  Yelled out due to leg pain and swelling when repositioned and cleaned.

## 2023-08-26 NOTE — TOC CAGE-AID Note (Signed)
Transition of Care [facility]) - CAGE-AID Screening   Patient Details  Name: Mallory Estrada MRN: [id number] Date of Birth: Mar 16, 1941   Hewitt Shorts, RN Trauma Response Nurse Phone Number: [phone] 08/26/2023, 11:44 AM   Clinical Narrative:  Pt fell at assisted living facility - [facility], c/o right hip pain  CAGE-AID Screening:    Have You Ever Felt You Ought to Cut Down on Your Drinking or Drug Use?: No Have People Annoyed You By [employer] Your Drinking Or Drug Use?: No Have You Felt Bad Or Guilty About Your Drinking Or Drug Use?: No Have You Ever Had a Drink or Used Drugs First Thing In The Morning to Steady Your Nerves or to Get Rid of a Hangover?: No CAGE-AID Score: 0  Substance Abuse Education Offered: No

## 2023-08-26 NOTE — H&P (Signed)
Date: 08/26/2023               Patient Name:  Mallory Estrada MRN: [id number]  DOB: February 12, 1941 Age / Sex: 82 y.o., female   PCP: Joaquim Nam, MD         Medical Service: Internal Medicine Teaching Service         Attending Physician: Dr. Oswaldo Done, Marquita Palms, *      First Contact: Dr. Manuela Neptune, MD Pager [phone]    Second Contact: Dr. Morene Crocker, MD Pager [phone]         After Hours (After 5p/  First Contact Pager: [phone]  weekends / holidays): Second Contact Pager: [phone]   SUBJECTIVE   Chief Complaint: Larey Seat backwards while using walker  History of Present Illness: Mallory Estrada is a 82 yo person with mild dementia, atrial fibrillation on DOAC, mobility impairment, recurrent mechanical falls who presented from [facility] at [facility] living facility after sustaining a mechanical fall on DOAC  Patient's history was obtained from patient and patient's daughter and Andres, Mallory Estrada, at the bedside. They both report Mallory Estrada was attempting to walk with her walker with the assistance of one of the living facility when she couldn't hold her body upright and fell backwards on to her head. She did not lose consciousness and has been able to communicate at her baseline.   Of note, this patient's last fall was last Saturday. That fall, however, was secondary to sundowning, attempting to get out of the bed without assistance or walker, and falling. She did not sustain trauma to the head at the time. She was evaluated by paramedics, but did not seek further medical attention. Per Mallory Estrada, this is her mother's 6th fall this year.   She currently denies bleeding per mouth or rectum, abdominal pain, headaches, changes in vision, or neck discomfort. She reports R hip pain and bruising along her R shins, which, per daughter, has worsened since last Saturday's fall. Her daughter notes that this has become progressively warmer and more erythematous. No  fevers, chills, difficulty breathing, chest pain, or palpitations. Has not been more forgetful or seemed confused. No pain, fasciculations, or changes in her movement of her lower extremities.   Meds:  Facility administered medications Tylenol 500 mg every 6 hrs PRN - Lidocaine patch for R shoulder every 24 hours Eliquis 2.5 mg BID Atorvastatin 10 mg daily Vi D3 5000 units daily Flexeril 5 mg TID PRN for muscle spasm Estradil vaginal ring q3 months Lasix 20 mg M-Sat (except Sundays) Claritin 10 mg tablets daily Synthroid 50 mcg daily Magnesium 500 mg    Past Medical History  Past Surgical History:  Procedure Laterality Date   BACK SURGERY     BREAST BIOPSY     HIP SURGERY Bilateral    KNEE SURGERY Bilateral    TONSILLECTOMY AND ADENOIDECTOMY      Social:  Lives With: [facility] Support: Daughter- Mallory Estrada Level of Function: dependent for ADLs and iADLs PCP: at [facility] living facility Substances: None other than prescribed medications  Family History: N/A  Allergies: Allergies as of 08/26/2023 - Review Complete 08/26/2023  Allergen Reaction Noted   Fentanyl  12/05/2020   Gabapentin  10/29/2021   Ketamine  12/05/2020   Macrobid [nitrofurantoin]  12/10/2020   Memantine  02/15/2022   Sulfa antibiotics  12/05/2020   Trazodone and nefazodone  12/30/2022   Anesthetics, amide Nausea Only 11/28/2022    Review of Systems: A complete  ROS was negative except as per HPI.   OBJECTIVE:   Physical Exam: Blood pressure (!) 171/68, pulse 60, temperature 98.1 F (36.7 C), resp. rate 17, height 5\' 4"  (1.626 m), weight 83.9 kg, SpO2 95%.  Constitutional: well-appearing elderly woman resting in bed in NAD  HENT: Mild tenderness to palpation over the posterior scalp. Full EOM,  mucous membranes moist, no tenderness to palpation over cervical spine.  Cardiovascular: regular rate and rhythm, no m/r/g, No JVD. Bilateral RD, DP, and TP pulses intact and  symmetric Pulmonary/Chest: normal work of breathing on room air, lungs clear to auscultation bilaterally. No crackles  Abdominal: soft, non-tender, non-distended.  Neurological: alert & oriented to self, place, and situation. Able to recount episodes or falls and her current medical conditions. Forgetful of dates and specific details about her past medical history; however, recognizes daughter and has insight into her medical conditions MSK: Inability to abduct or adduct R shoulder, chronic. Unable to open L hand completely. Pain to palpation to R G trochanter area and surrounding tissues. Able to internally and externally rotate R leg; however patient is able to planta and dorsiflex foot.  Psych: Normal mood and affect Skin: Warmth, erythema, ecchymosis, and edema from knee to ankle. No skin barrier break. No crepitus or purulent drainage     Labs: CBC    Component Value Date/Time   WBC 6.2 08/26/2023 1700   RBC 3.94 08/26/2023 1700   HGB 12.3 08/26/2023 1700   HCT 37.0 08/26/2023 1700   PLT 183 08/26/2023 1700   MCV 93.9 08/26/2023 1700   MCH 31.2 08/26/2023 1700   MCHC 33.2 08/26/2023 1700   RDW 12.9 08/26/2023 1700   LYMPHSABS 1.0 08/26/2023 1700   MONOABS 0.5 08/26/2023 1700   EOSABS 0.1 08/26/2023 1700   BASOSABS 0.0 08/26/2023 1700     CMP     Component Value Date/Time   NA 142 08/26/2023 1151   K 4.0 08/26/2023 1151   CL 105 08/26/2023 1151   CO2 27 03/15/2023 1611   GLUCOSE 107 (H) 08/26/2023 1151   BUN 28 (H) 08/26/2023 1151   CREATININE 1.10 (H) 08/26/2023 1151   CALCIUM 9.7 03/15/2023 1611   PROT 6.9 03/15/2023 1611   ALBUMIN 4.1 03/15/2023 1611   AST 18 03/15/2023 1611   ALT 11 03/15/2023 1611   ALKPHOS 58 03/15/2023 1611   BILITOT 0.4 03/15/2023 1611    Imaging: CT Head Wo Contrast IMPRESSION: Posterior scalp hematoma. No skull fracture or acute intracranial abnormality. Electronically Signed   By: Odessa Fleming M.D.   On: 08/26/2023 12:46   CT Cervical  Spine Wo Contrast IMPRESSION: 1. No acute traumatic injury identified in the cervical spine. 2. Chronic cervical spine degeneration. Electronically Signed   By: Odessa Fleming M.D.   On: 08/26/2023 12:53     DG Hip Unilat W or Wo Pelvis 2-3 Views Right IMPRESSION: Bilateral total hip arthroplasty. No acute fracture or dislocation identified about the pelvis or right hip. Electronically Signed   By: Odessa Fleming M.D.   On: 08/26/2023 12:48   DG Femur Min 2 Views Right IMPRESSION: 1. No acute osseous abnormality. 2. Intact right total hip and total knee arthroplasties. Electronically Signed   By: Hart Robinsons M.D.   On: 08/26/2023 17:09   DG Tibia/Fibula Right IMPRESSION: Generalized lower extremity soft tissue swelling. No acute fracture or dislocation identified about the right tib-fib. Right total knee arthroplasty with no adverse features. Electronically Signed   By: Rexene Edison  Margo Aye M.D.   On: 08/26/2023 12:50   EKG: personally reviewed my interpretation is sinus rhythm best seen on V6. No prior EKG on file. Bedside telemetry with HR 65 and sinus rhythm on lead II.   ASSESSMENT & PLAN:   Assessment & Plan by Problem: Principal Problem:   Cellulitis of right lower extremity Active Problems:   Gait abnormality   Mild dementia, unclear etiology   Recurrent falls   Current use of long term anticoagulation  Mild Uncomplicated non purulent soft tissue infection RLE edema  Patient with stable vital signs s/p mechanical fall with locally confined RLE ecchymosis with overlying tissue erythema with non-demarcated borders and without crepitus or purulence consistent with clinical signs of cellulitis CBC without leukocytosis at this time. Patient received CTX in the ED and is awaiting blood cultures. Will monitor fever curve, WBC, and clinical signs for improvement -Augmentin 875-125 mg BID tomorrow AM -Monitor fever, WBC trend -Follow up MRSA swab; if positive, expand antibiotic coverage  Mechanical fall on  anticoagulation Mild dementia of unknown etiology Gait abnormality with mobility impatent Patient with mild demential, difficulty for speech, No LOC. Skeletal survey on imaging without osseous abnormalities on CT head, cervical spine or XR of R hip, femur or tibia/fibula. Mild posterior hematoma without HA or neurological changes. Pulses intact bilaterally. Managing with supportive treatment at this time. Will monitor neuro exam, Hgb trend. If continued pain and decreased Hgb, will consider CT abdomen and pelvis to rule out hematoma in setting of anticoagulation  -Pain control: scheduled tylenol, lidocaine patches -PT and OT evaluation -Delirium precautions  Hx of atrial fibrillation Initially diagnosed after orthopedic surgery. NSR on admission. Previously on Coumadin, on DOAC since May of 2024. Chadvasc score 5, embolic infarct risk of 10%. Hasbled score of 3,5.8% risk of major bleeding/ 3.72 bleeds per 100 patient-years. However, given recurrent falls in this patient, patient and daughter are considering discontinuing anticoagulation for risk of bleeding. Discussed the benefits and the risks. They would like to hold anticoagulation at this time and will re-engage in conversation in the AM.  -Monitor cardiac telemetry -Holding [facility]; will re-engage in conversation in the AM  Hypertension Diastolic HF Mildly hypertensive on admission likely secondary to pain. No signs of acute on chronic HF. No recent TTE on file.  -Holding lasix at this time -CTM vital signs -Continue Statin  Recurrent UTI Vaginal atrophy No urinary symptoms today. Patient is managed by urogynecology outpatient. -Estrogen ring q 3 months  Depression -Zoloft  Diet: Heart Healthy VTE: SCDs Code: DNR/DNI  Prior to Admission Living Arrangement: Senior living facility Anticipated Discharge Location:SNF Barriers to Discharge: Clinical improvement  Dispo: Admit patient to Observation with expected length of stay less than  2 midnights.  Signed:   Morene Crocker, MD Internal Medicine Resident PGY-2 08/26/2023, 6:41 PM   Please contact the on call pager after 5 pm and on weekends at [phone].

## 2023-08-26 NOTE — ED Notes (Signed)
Attempted ambulation of pt.  Pt could not bear weigh on right leg.  Stated it hurt too much.  I was providing a lot of support as she tried to walk.  Family says she is fairly weak at baseline but has ambulated with a walker with PT on occasion in the past.  More often than not she is assisted to a wheelchair but she was using a walker and bearing weight to some degree when she fell at facility today.

## 2023-08-26 NOTE — ED Notes (Signed)
The ed doctor is at  the bedside   daughter at  the bedside

## 2023-08-26 NOTE — ED Notes (Signed)
ED TO INPATIENT HANDOFF REPORT  ED Nurse Name and Phone #: chris c [phone]  S Name/Age/Gender Mallory Estrada 82 y.o. female Room/Bed: 012C/012C  Code Status   Code Status: Limited: Do not attempt resuscitation (DNR) -DNR-LIMITED -Do Not Intubate/DNI   Home/SNF/Other Home Patient oriented to: self and place Is this baseline? Yes   Triage Complete: Triage complete  Chief Complaint Cellulitis of right lower extremity [L03.115]  Triage Note Pt BIB [facility] from [place] for a witnessed mechanical fall on Eliquis. She was using walker with staff to go to bathroom and lost balance and fell back, hitting back of head.  Pt complains of 8/10 pain in hip, pt was complaining of some hip pain prior to the fall but says it is worse now.   Pt does not have any head pain. Pt has a quarter-size hematoma to back of head.  Pt also has old bruising to legs from fall out of bed 5 days ago.  PT was not evaluated for that fall.    Allergies Allergies  Allergen Reactions   Fentanyl     Nausea/vomiting.  Can tolerate dilaudid   Gabapentin     vertigo   Ketamine     intolerant   Macrobid [Nitrofurantoin]    Memantine     Dizzy/falls.     Sulfa Antibiotics     swelling   Trazodone And Nefazodone     Intolerant but not allergic   Anesthetics, Amide Nausea Only    Level of Care/Admitting Diagnosis ED Disposition     ED Disposition  Admit   Condition  --   Comment  Hospital Area: [facility] [[id number]]  Level of Care: Telemetry Cardiac [[id number]]  May place patient in observation at [facility] or [facility] if equivalent level of care is available:: No  Covid Evaluation: Asymptomatic - no recent exposure (last 10 days) testing not required  Diagnosis: Cellulitis of right lower extremity [960454]  Admitting Physician: Tyson Alias [[id number]]  Attending Physician: Tyson Alias [[id number]]          B Medical/Surgery History Past Medical History:   Diagnosis Date   Arthritis of finger of both hands 06/17/2021   Atrial fibrillation    Noted postop.   Gait abnormality    H/O laminectomy 01/14/2022   Hip arthritis    Hyperlipidemia    Hypothyroidism    Insomnia 02/18/2022   Left-sided chest wall pain 10/13/2021   Mild dementia, unclear etiology 09/15/2022   Nonintractable headache 11/02/2021   Ophthalmoplegic migraine    Pulmonary nodule 06/09/2022   CXR 06/2022    Recurrent UTI 03/26/2021   Refusal of blood transfusions as patient is Jehovah's Witness 12/29/2020   Shoulder pain 09/02/2021   SUI (stress urinary incontinence, female)    Toe pain 09/02/2021   Past Surgical History:  Procedure Laterality Date   BACK SURGERY     BREAST BIOPSY     HIP SURGERY Bilateral    KNEE SURGERY Bilateral    TONSILLECTOMY AND ADENOIDECTOMY       A IV Location/Drains/Wounds Patient Lines/Drains/Airways Status     Active Line/Drains/Airways     Name Placement date Placement time Site Days   Peripheral IV 08/26/23 22 G 1.75" Anterior;Left Forearm 08/26/23  1714  Forearm  less than 1   External Urinary Catheter 08/26/23  1227  --  less than 1            Intake/Output Last 24 hours No intake or  output data in the 24 hours ending 08/26/23 1838  Labs/Imaging Results for orders placed or performed during the hospital encounter of 08/26/23 (from the past 48 hour(s))  I-stat chem 8, ED (not at [facility], [facility] or [facility])     Status: Abnormal   Collection Time: 08/26/23 11:51 AM  Result Value Ref Range   Sodium 142 135 - 145 mmol/L   Potassium 4.0 3.5 - 5.1 mmol/L   Chloride 105 98 - 111 mmol/L   BUN 28 (H) 8 - 23 mg/dL   Creatinine, Ser 1.61 (H) 0.44 - 1.00 mg/dL   Glucose, Bld 096 (H) 70 - 99 mg/dL    Comment: Glucose reference range applies only to samples taken after fasting for at least 8 hours.   Calcium, Ion 1.06 (L) 1.15 - 1.40 mmol/L   TCO2 26 22 - 32 mmol/L   Hemoglobin 13.3 12.0 - 15.0 g/dL   HCT 04.5 40.9 - 81.1 %  CBC  with Differential     Status: None   Collection Time: 08/26/23  5:00 PM  Result Value Ref Range   WBC 6.2 4.0 - 10.5 K/uL   RBC 3.94 3.87 - 5.11 MIL/uL   Hemoglobin 12.3 12.0 - 15.0 g/dL   HCT 91.4 78.2 - 95.6 %   MCV 93.9 80.0 - 100.0 fL   MCH 31.2 26.0 - 34.0 pg   MCHC 33.2 30.0 - 36.0 g/dL   RDW 21.3 08.6 - 57.8 %   Platelets 183 150 - 400 K/uL   nRBC 0.0 0.0 - 0.2 %   Neutrophils Relative % 73 %   Neutro Abs 4.6 1.7 - 7.7 K/uL   Lymphocytes Relative 16 %   Lymphs Abs 1.0 0.7 - 4.0 K/uL   Monocytes Relative 9 %   Monocytes Absolute 0.5 0.1 - 1.0 K/uL   Eosinophils Relative 1 %   Eosinophils Absolute 0.1 0.0 - 0.5 K/uL   Basophils Relative 1 %   Basophils Absolute 0.0 0.0 - 0.1 K/uL   Immature Granulocytes 0 %   Abs Immature Granulocytes 0.02 0.00 - 0.07 K/uL    Comment: Performed at [facility], [address], [place], [place] [address]   DG Femur Min 2 Views Right  Result Date: 08/26/2023 CLINICAL DATA:  Pain after fall. EXAM: RIGHT FEMUR 2 VIEWS COMPARISON:  Same day radiographs of the right hip at 12:16 p.m. FINDINGS: No acute fracture identified. Status post right total hip arthroplasty. The femoral and acetabular components appear well seated with stable alignment. Status post right total knee arthroplasty with intact hardware in appropriate alignment. IMPRESSION: 1. No acute osseous abnormality. 2. Intact right total hip and total knee arthroplasties. Electronically Signed   By: Hart Robinsons M.D.   On: 08/26/2023 17:09   CT Cervical Spine Wo Contrast  Result Date: 08/26/2023 CLINICAL DATA:  82 year old female status post fall backwards, on Eliquis. Struck back of head. Pain. EXAM: CT CERVICAL SPINE WITHOUT CONTRAST TECHNIQUE: Multidetector CT imaging of the cervical spine was performed without intravenous contrast. Multiplanar CT image reconstructions were also generated. RADIATION DOSE REDUCTION: This exam was performed according to the departmental  dose-optimization program which includes automated exposure control, adjustment of the mA and/or kV according to patient size and/or use of iterative reconstruction technique. COMPARISON:  Head CT today. FINDINGS: Alignment: Straightening of cervical lordosis. Mild degenerative appearing spondylolisthesis at C4-C5, C5-C6, C7-T1. Bilateral posterior element alignment is within normal limits. Skull base and vertebrae: Bone mineralization is within normal limits for  age. Visualized skull base is intact. No atlanto-occipital dissociation. C1-C2 appear chronically degenerated but intact and aligned. No acute osseous abnormality identified. Soft tissues and spinal canal: No prevertebral fluid or swelling. No visible canal hematoma. Negative visible noncontrast neck soft tissues except for calcified cervical carotid atherosclerosis. Disc levels: Widespread cervical spine degeneration. But generally capacious CT appearance of the underlying spinal canal. Mild spinal stenosis is possible at C5-C6 and C6-C7. Upper chest: Visible upper thoracic levels appear grossly intact. Mild apical lung scarring. Negative visible noncontrast thoracic inlet. IMPRESSION: 1. No acute traumatic injury identified in the cervical spine. 2. Chronic cervical spine degeneration. Electronically Signed   By: Odessa Fleming M.D.   On: 08/26/2023 12:53   DG Tibia/Fibula Right  Result Date: 08/26/2023 CLINICAL DATA:  82 year old female status post fall backwards, on Eliquis. Struck back of head. Pain. EXAM: RIGHT TIBIA AND FIBULA - 2 VIEW COMPARISON:  None Available. FINDINGS: Right total knee arthroplasty. Hardware appears intact and aligned. Postoperative changes to the undersurface of the patella. No joint effusion on the cross-table lateral view. Underlying bone mineralization within normal limits for age. Maintained alignment at the right ankle. Right tibia and fibula appear intact. Calcaneus appears intact with mild degenerative spurring.  Generalized lower extremity subcutaneous swelling, stranding. IMPRESSION: Generalized lower extremity soft tissue swelling. No acute fracture or dislocation identified about the right tib-fib. Right total knee arthroplasty with no adverse features. Electronically Signed   By: Odessa Fleming M.D.   On: 08/26/2023 12:50   DG Hip Unilat W or Wo Pelvis 2-3 Views Right  Result Date: 08/26/2023 CLINICAL DATA:  82 year old female status post fall backwards, on Eliquis. Struck back of head. Pain. EXAM: DG HIP (WITH OR WITHOUT PELVIS) 2-3V RIGHT COMPARISON:  None Available. FINDINGS: Bilateral total hip arthroplasty and partially visible lumbar spine fusion hardware. Left femoral component incompletely visible. Normal AP alignment of both hips. No pelvis fracture identified. Proximal left femur appears grossly intact. Right hip hardware completely visible, intact and aligned. Proximal right femur appears intact. Negative visible bowel gas pattern, retained stool. IMPRESSION: Bilateral total hip arthroplasty. No acute fracture or dislocation identified about the pelvis or right hip. Electronically Signed   By: Odessa Fleming M.D.   On: 08/26/2023 12:48   CT Head Wo Contrast  Result Date: 08/26/2023 CLINICAL DATA:  82 year old female status post fall backwards, on Eliquis. Struck back of head. Pain. EXAM: CT HEAD WITHOUT CONTRAST TECHNIQUE: Contiguous axial images were obtained from the base of the skull through the vertex without intravenous contrast. RADIATION DOSE REDUCTION: This exam was performed according to the departmental dose-optimization program which includes automated exposure control, adjustment of the mA and/or kV according to patient size and/or use of iterative reconstruction technique. COMPARISON:  Brain MRI 02/08/2022.  Head CT 10/13/2022. FINDINGS: Brain: Stable cerebral volume. No midline shift, mass effect, or evidence of intracranial mass lesion. No acute ventriculomegaly. Normal basilar cisterns. No acute  intracranial hemorrhage identified. No cortically based acute infarct identified. Gray-white differentiation stable and within normal limits for age. Vascular: No suspicious intracranial vascular hyperdensity. Calcified atherosclerosis at the skull base. Skull: Stable.  No acute osseous abnormality identified. Sinuses/Orbits: Visualized paranasal sinuses and mastoids are stable and well aerated. Other: Broad-based posterior scalp hematoma tracking toward the vertex, up to 6 mm in thickness. Underlying calvarium appears stable and intact. No scalp soft tissue gas identified. Visualized orbit soft tissues are within normal limits. IMPRESSION: Posterior scalp hematoma. No skull fracture or acute intracranial abnormality. Electronically Signed  By: Odessa Fleming M.D.   On: 08/26/2023 12:46    Pending Labs Unresulted Labs (From admission, onward)     Start     Ordered   08/27/23 0500  Basic metabolic panel  Tomorrow morning,   R       Question:  Specimen collection method  Answer:  IV Team=IV Team collect   08/26/23 1758   08/27/23 0500  CBC  Tomorrow morning,   R       Question:  Specimen collection method  Answer:  IV Team=IV Team collect   08/26/23 1813   08/26/23 1821  Culture, blood (Routine X 2) w Reflex to ID Panel  BLOOD CULTURE X 2,   R (with TIMED occurrences)      08/26/23 1820            Vitals/Pain Today's Vitals   08/26/23 1700 08/26/23 1751 08/26/23 1800 08/26/23 1830  BP: (!) 147/76 (!) 171/68 (!) 171/77   Pulse: 66 60 (!) 57   Resp: (!) 21 17 18    Temp:      TempSrc:      SpO2: 97% 95% 94%   Weight:      Height:      PainSc:    5     Isolation Precautions No active isolations  Medications Medications  atorvastatin (LIPITOR) tablet 10 mg (has no administration in time range)  sertraline (ZOLOFT) tablet 25 mg (has no administration in time range)  levothyroxine (SYNTHROID) tablet 50 mcg (has no administration in time range)  senna-docusate (Senokot-S) tablet 2 tablet  (has no administration in time range)  cyclobenzaprine (FLEXERIL) tablet 5 mg (has no administration in time range)  magnesium gluconate (MAGONATE) tablet 500 mg (has no administration in time range)  cholecalciferol (VITAMIN D3) 25 MCG (1000 UNIT) tablet 5,000 Units (has no administration in time range)  acetaminophen (TYLENOL) tablet 650 mg (has no administration in time range)    Or  acetaminophen (TYLENOL) suppository 650 mg (has no administration in time range)  polyethylene glycol (MIRALAX / GLYCOLAX) packet 17 g (has no administration in time range)  lidocaine (LIDODERM) 5 % 1 patch (has no administration in time range)  acetaminophen (TYLENOL) tablet 650 mg (650 mg Oral Given 08/26/23 1334)  cefTRIAXone (ROCEPHIN) 1 g in sodium chloride 0.9 % 100 mL IVPB (0 g Intravenous Stopped 08/26/23 1825)    Mobility walks with device     Focused Assessments Cardiac Assessment Handoff:  Cardiac Rhythm: Normal sinus rhythm No results found for: "CKTOTAL", "CKMB", "CKMBINDEX", "TROPONINI" No results found for: "DDIMER" Does the Patient currently have chest pain? No    R Recommendations: See Admitting Provider Note  Report given to:   Additional Notes:

## 2023-08-26 NOTE — ED Provider Notes (Signed)
[place] EMERGENCY DEPARTMENT AT [facility] Provider Note   CSN: [id number] Arrival date & time: 08/26/23  1118     History  No chief complaint on file.   Mallory Estrada is a 82 y.o. female.  HPI 82 year old female presents today after reported mechanical fall.  She is a level 2 trauma due to hitting her head.  There is no report of loss of consciousness.  She has had several falls recently.  She has bruising of the right leg from the knee down to the ankle that is reported to be from a previous fall.  She was not seen in the hospital at that time.  She was examined by paramedics who were called out and reported that she did not need further evaluation.  Initial history obtained from EMS then daughter at bedside     Home Medications Prior to Admission medications   Medication Sig Start Date End Date Taking? Authorizing Provider  acetaminophen (TYLENOL) 500 MG tablet Take 500-1,000 mg by mouth every 6 (six) hours as needed for moderate pain (pain score 4-6).   Yes [provider]  apixaban (ELIQUIS) 5 MG TABS tablet Take 1 tablet (5 mg total) by mouth 2 (two) times daily. 04/10/23  Yes Joaquim Nam, MD  atorvastatin (LIPITOR) 10 MG tablet TAKE 1 TABLET DAILY 06/24/22  Yes Joaquim Nam, MD  benzocaine (HURRICAINE) 20 % GEL Use as directed 1 Application in the mouth or throat 3 (three) times daily as needed (Gum/Denture Pain).   Yes [provider]  Carboxymethylcellulose Sodium (REFRESH TEARS OP) Place 1 drop into both eyes daily as needed (Floaters).   Yes [provider]  Cholecalciferol (VITAMIN D3) 125 MCG (5000 UT) CAPS Take 5,000 Units by mouth daily.   Yes [provider]  cyclobenzaprine (FLEXERIL) 5 MG tablet Take 5 mg by mouth 3 (three) times daily as needed for muscle spasms.   Yes [provider]  divalproex (DEPAKOTE) 125 MG DR tablet Take 1 tablet (125 mg total) by mouth at bedtime. 08/02/23  Yes Marcos Eke, PA-C  docusate sodium (COLACE) 100 MG capsule Take 100 mg by mouth daily.   Yes [provider]  estradiol (ESTRING) 7.5 MCG/24HR vaginal ring Place 2 mg vaginally every 3 (three) months. follow package directions 05/24/23  Yes Selmer Dominion, NP  furosemide (LASIX) 20 MG tablet TAKE 1 TABLET DAILY EXCEPT SKIP SUNDAYS.  TAKE MONDAY THROUGH SATURDAY 04/10/23  Yes Joaquim Nam, MD  KLOR-CON M20 20 MEQ tablet TAKE 1 TABLET DAILY 04/13/23  Yes Joaquim Nam, MD  levothyroxine (SYNTHROID) 50 MCG tablet TAKE 1 TABLET 30 MINUTES BEFORE BREAKFAST. NEW PRESCRIPTION REQUEST 08/04/22  Yes Joaquim Nam, MD  lidocaine (ASPERCREME LIDOCAINE) 4 % Place 1 patch onto the skin daily as needed (Pain -  right arm/shoulder).   Yes [provider]  loratadine (CLARITIN) 10 MG tablet Take 1 tablet (10 mg total) by mouth daily. 05/31/22  Yes Joaquim Nam, MD  magnesium gluconate (MAGONATE) 500 MG tablet Take 1 tablet (500 mg total) by mouth every evening. 04/10/23  Yes Joaquim Nam, MD  MELATONIN PO Take 3 mg by mouth at bedtime.   Yes [provider]  Petrolatum (PETROLEUM JELLY EX) Apply 1 Application topically 2 (two) times daily as needed (Leg creases to prevent chafing).   Yes [provider]  phenylephrine-shark liver oil-mineral oil-petrolatum (PREPARATION H) 0.25-14-74.9 % rectal ointment Place 1 Application rectally every  6 (six) hours as needed for hemorrhoids.   Yes [provider]  polyethylene glycol powder (GLYCOLAX/MIRALAX) 17 GM/SCOOP powder Take 17 g by mouth daily as needed (for constipation). 12/18/21  Yes Joaquim Nam, MD  progesterone (PROMETRIUM) 100 MG capsule 1 capsule by mouth daily for the 10 days following Estring replacement every 3 months 04/10/23  Yes Joaquim Nam, MD  psyllium (REGULOID) 0.52 g capsule Take 0.52 g by mouth at bedtime.   Yes [provider]  senna-docusate (SENNA S) 8.6-50 MG tablet Take 2 tablets by  mouth daily. 04/10/23  Yes Joaquim Nam, MD  sertraline (ZOLOFT) 25 MG tablet Take 1 tablet (25 mg total) by mouth every evening. 04/10/23  Yes Joaquim Nam, MD  ciprofloxacin (CIPRO) 250 MG tablet Take 250 mg by mouth 2 (two) times daily. X5 days Patient not taking: Reported on 08/26/2023 08/19/23   [provider]      Allergies    Fentanyl; Gabapentin; Ketamine; Macrobid [nitrofurantoin]; Memantine; Sulfa antibiotics; Trazodone and nefazodone; and Anesthetics, amide    Review of Systems   Review of Systems  Physical Exam Updated Vital Signs BP (!) 150/60   Pulse 68   Temp 98.1 F (36.7 C)   Resp 18   Ht 1.626 m (5\' 4" )   Wt 83.9 kg   SpO2 98%   BMI 31.76 kg/m  Physical Exam Vitals reviewed.  Constitutional:      Appearance: Normal appearance.  HENT:     Head: Normocephalic.     Comments: Swelling with mild tenderness posterior scalp    Right Ear: External ear normal.     Left Ear: External ear normal.     Nose: Nose normal.     Mouth/Throat:     Pharynx: Oropharynx is clear.  Eyes:     Extraocular Movements: Extraocular movements intact.     Pupils: Pupils are equal, round, and reactive to light.  Cardiovascular:     Rate and Rhythm: Normal rate and regular rhythm.     Pulses: Normal pulses.  Pulmonary:     Effort: Pulmonary effort is normal.  Abdominal:     General: Abdomen is flat.     Palpations: Abdomen is soft.  Musculoskeletal:     Cervical back: Normal range of motion.     Comments: Some mild pain noted right hip with discoloration and some swelling edema from the knee to the ankle. There is overlying erythema and warmth form dorsal foot medially to lower leg with diffuse ttp- swelling noted relative to left foot Pulses are intact Pelvis appears stable No point tenderness noted over femur  Skin:    General: Skin is warm and dry.  Neurological:     General: No focal deficit present.     Mental Status: She is alert.     ED Results /  Procedures / Treatments   Labs (all labs ordered are listed, but only abnormal results are displayed) Labs Reviewed  I-STAT CHEM 8, ED - Abnormal; Notable for the following components:      Result Value   BUN 28 (*)    Creatinine, Ser 1.10 (*)    Glucose, Bld 107 (*)    Calcium, Ion 1.06 (*)    All other components within normal limits  CBC WITH DIFFERENTIAL/PLATELET    EKG None  Radiology DG Femur Min 2 Views Right  Result Date: 08/26/2023 CLINICAL DATA:  Pain after fall. EXAM: RIGHT FEMUR 2 VIEWS COMPARISON:  Same day radiographs  of the right hip at 12:16 p.m. FINDINGS: No acute fracture identified. Status post right total hip arthroplasty. The femoral and acetabular components appear well seated with stable alignment. Status post right total knee arthroplasty with intact hardware in appropriate alignment. IMPRESSION: 1. No acute osseous abnormality. 2. Intact right total hip and total knee arthroplasties. Electronically Signed   By: Hart Robinsons M.D.   On: 08/26/2023 17:09   CT Cervical Spine Wo Contrast  Result Date: 08/26/2023 CLINICAL DATA:  82 year old female status post fall backwards, on Eliquis. Struck back of head. Pain. EXAM: CT CERVICAL SPINE WITHOUT CONTRAST TECHNIQUE: Multidetector CT imaging of the cervical spine was performed without intravenous contrast. Multiplanar CT image reconstructions were also generated. RADIATION DOSE REDUCTION: This exam was performed according to the departmental dose-optimization program which includes automated exposure control, adjustment of the mA and/or kV according to patient size and/or use of iterative reconstruction technique. COMPARISON:  Head CT today. FINDINGS: Alignment: Straightening of cervical lordosis. Mild degenerative appearing spondylolisthesis at C4-C5, C5-C6, C7-T1. Bilateral posterior element alignment is within normal limits. Skull base and vertebrae: Bone mineralization is within normal limits for age. Visualized  skull base is intact. No atlanto-occipital dissociation. C1-C2 appear chronically degenerated but intact and aligned. No acute osseous abnormality identified. Soft tissues and spinal canal: No prevertebral fluid or swelling. No visible canal hematoma. Negative visible noncontrast neck soft tissues except for calcified cervical carotid atherosclerosis. Disc levels: Widespread cervical spine degeneration. But generally capacious CT appearance of the underlying spinal canal. Mild spinal stenosis is possible at C5-C6 and C6-C7. Upper chest: Visible upper thoracic levels appear grossly intact. Mild apical lung scarring. Negative visible noncontrast thoracic inlet. IMPRESSION: 1. No acute traumatic injury identified in the cervical spine. 2. Chronic cervical spine degeneration. Electronically Signed   By: Odessa Fleming M.D.   On: 08/26/2023 12:53   DG Tibia/Fibula Right  Result Date: 08/26/2023 CLINICAL DATA:  82 year old female status post fall backwards, on Eliquis. Struck back of head. Pain. EXAM: RIGHT TIBIA AND FIBULA - 2 VIEW COMPARISON:  None Available. FINDINGS: Right total knee arthroplasty. Hardware appears intact and aligned. Postoperative changes to the undersurface of the patella. No joint effusion on the cross-table lateral view. Underlying bone mineralization within normal limits for age. Maintained alignment at the right ankle. Right tibia and fibula appear intact. Calcaneus appears intact with mild degenerative spurring. Generalized lower extremity subcutaneous swelling, stranding. IMPRESSION: Generalized lower extremity soft tissue swelling. No acute fracture or dislocation identified about the right tib-fib. Right total knee arthroplasty with no adverse features. Electronically Signed   By: Odessa Fleming M.D.   On: 08/26/2023 12:50   DG Hip Unilat W or Wo Pelvis 2-3 Views Right  Result Date: 08/26/2023 CLINICAL DATA:  82 year old female status post fall backwards, on Eliquis. Struck back of head. Pain.  EXAM: DG HIP (WITH OR WITHOUT PELVIS) 2-3V RIGHT COMPARISON:  None Available. FINDINGS: Bilateral total hip arthroplasty and partially visible lumbar spine fusion hardware. Left femoral component incompletely visible. Normal AP alignment of both hips. No pelvis fracture identified. Proximal left femur appears grossly intact. Right hip hardware completely visible, intact and aligned. Proximal right femur appears intact. Negative visible bowel gas pattern, retained stool. IMPRESSION: Bilateral total hip arthroplasty. No acute fracture or dislocation identified about the pelvis or right hip. Electronically Signed   By: Odessa Fleming M.D.   On: 08/26/2023 12:48   CT Head Wo Contrast  Result Date: 08/26/2023 CLINICAL DATA:  82 year old female status post fall  backwards, on Eliquis. Struck back of head. Pain. EXAM: CT HEAD WITHOUT CONTRAST TECHNIQUE: Contiguous axial images were obtained from the base of the skull through the vertex without intravenous contrast. RADIATION DOSE REDUCTION: This exam was performed according to the departmental dose-optimization program which includes automated exposure control, adjustment of the mA and/or kV according to patient size and/or use of iterative reconstruction technique. COMPARISON:  Brain MRI 02/08/2022.  Head CT 10/13/2022. FINDINGS: Brain: Stable cerebral volume. No midline shift, mass effect, or evidence of intracranial mass lesion. No acute ventriculomegaly. Normal basilar cisterns. No acute intracranial hemorrhage identified. No cortically based acute infarct identified. Gray-white differentiation stable and within normal limits for age. Vascular: No suspicious intracranial vascular hyperdensity. Calcified atherosclerosis at the skull base. Skull: Stable.  No acute osseous abnormality identified. Sinuses/Orbits: Visualized paranasal sinuses and mastoids are stable and well aerated. Other: Broad-based posterior scalp hematoma tracking toward the vertex, up to 6 mm in  thickness. Underlying calvarium appears stable and intact. No scalp soft tissue gas identified. Visualized orbit soft tissues are within normal limits. IMPRESSION: Posterior scalp hematoma. No skull fracture or acute intracranial abnormality. Electronically Signed   By: Odessa Fleming M.D.   On: 08/26/2023 12:46    Procedures Procedures    Medications Ordered in ED Medications  cefTRIAXone (ROCEPHIN) 1 g in sodium chloride 0.9 % 100 mL IVPB (has no administration in time range)  acetaminophen (TYLENOL) tablet 650 mg (650 mg Oral Given 08/26/23 1334)    ED Course/ Medical Decision Making/ A&P Clinical Course as of 08/26/23 1713  Fri Aug 26, 2023  1223 I-STAT 8 reviewed interpreted significant for normal hemoglobin, normal electrolytes [DR]  1300 CT cervical spine shows no acute traumatic injury and chronic cervical spine degeneration radiologist interpretation concurs [DR]  1300 CT head significant for posterior scalp hematoma with no evidence of acute intracranial abnormality [DR]  1301 Tib-fib shows no acute fracture or dislocation [DR]  1301 Right hip shows bilateral total hip arthroplasty but no acute fracture or dislocation of the pelvis or hip [DR]    Clinical Course User Index [DR] Margarita Grizzle, MD                                 Medical Decision Making Amount and/or Complexity of Data Reviewed Labs: ordered. Radiology: ordered.  Risk OTC drugs.  The 60-year-old female presents as a level 2 trauma due to fall on blood thinners. Differential diagnosis included but not limited to intracranial hemorrhage, fractured bone, other etiology of fall such as infection or hypotension, On physical exam patient appears well with the exception of some erythema over previously injured right lower extremity and some pain throughout the right hip and femur.  She did have a contusion of her head.  The contusion of her head was evaluated with a CT scan no evidence of acute intracranial hemorrhage was  noted. She had imaging of right tib-fib, pelvis and right hip with no evidence of fracture.  She was given Tylenol. Patient attempted to stand complained of pain in right lower extremity.  Patient reexamined.  She does have some increasing erythema over the lower leg where she has the bruising.  She seems to have pain in the distal right femur.  Tib-fib x-Javier Gell and right hip did not show any fracture.  Will add femur film and a CBC Right lower extremity consistent with cellulitis with erythema and warmth.  No gas seen in  the soft tissues on plain x-Levan Aloia.  Plan IV antibiotics and admission Discussed with itching service, on-call for unassigned.  They will see for admission        Final Clinical Impression(s) / ED Diagnoses Final diagnoses:  Cellulitis of right lower extremity  Fall, initial encounter  Chronic anticoagulation  Contusion of scalp, initial encounter    Rx / DC Orders ED Discharge Orders     None         Margarita Grizzle, MD 08/26/23 1713

## 2023-08-27 ENCOUNTER — Other Ambulatory Visit: Payer: Self-pay

## 2023-08-27 DIAGNOSIS — S0003XA Contusion of scalp, initial encounter: Secondary | ICD-10-CM | POA: Diagnosis not present

## 2023-08-27 DIAGNOSIS — F0390 Unspecified dementia without behavioral disturbance: Secondary | ICD-10-CM | POA: Diagnosis not present

## 2023-08-27 DIAGNOSIS — Z7901 Long term (current) use of anticoagulants: Secondary | ICD-10-CM | POA: Diagnosis not present

## 2023-08-27 DIAGNOSIS — L03115 Cellulitis of right lower limb: Secondary | ICD-10-CM | POA: Diagnosis not present

## 2023-08-27 LAB — BASIC METABOLIC PANEL
Anion gap: 9 (ref 5–15)
BUN: 18 mg/dL (ref 8–23)
CO2: 25 mmol/L (ref 22–32)
Calcium: 9.2 mg/dL (ref 8.9–10.3)
Chloride: 108 mmol/L (ref 98–111)
Creatinine, Ser: 0.9 mg/dL (ref 0.44–1.00)
GFR, Estimated: >60 mL/min (ref >60)
Glucose, Bld: 102 mg/dL — ABNORMAL HIGH (ref 70–99)
Potassium: 3.5 mmol/L (ref 3.5–5.1)
Sodium: 142 mmol/L (ref 135–145)

## 2023-08-27 LAB — CBC
HCT: 34.8 % — ABNORMAL LOW (ref 36.0–46.0)
Hemoglobin: 11.6 g/dL — ABNORMAL LOW (ref 12.0–15.0)
MCH: 30.9 pg (ref 26.0–34.0)
MCHC: 33.3 g/dL (ref 30.0–36.0)
MCV: 92.6 fL (ref 80.0–100.0)
Platelets: 163 10*3/uL (ref 150–400)
RBC: 3.76 MIL/uL — ABNORMAL LOW (ref 3.87–5.11)
RDW: 12.9 % (ref 11.5–15.5)
WBC: 4.7 10*3/uL (ref 4.0–10.5)
nRBC: 0 % (ref 0.0–0.2)

## 2023-08-27 LAB — GLUCOSE, CAPILLARY
Glucose-Capillary: 128 mg/dL — ABNORMAL HIGH (ref 70–99)
Glucose-Capillary: 118 mg/dL — ABNORMAL HIGH (ref 70–99)
Glucose-Capillary: 129 mg/dL — ABNORMAL HIGH (ref 70–99)
Glucose-Capillary: 168 mg/dL — ABNORMAL HIGH (ref 70–99)

## 2023-08-27 MED ORDER — CEPHALEXIN 500 MG PO CAPS
500.0000 mg | ORAL_CAPSULE | Freq: Four times a day (QID) | ORAL | Status: DC
  Administered 2023-08-27 – 2023-08-29 (×9): 500 mg via ORAL
  Filled 2023-08-27 (×9): qty 1

## 2023-08-27 MED ORDER — CYCLOBENZAPRINE HCL 10 MG PO TABS
5.0000 mg | ORAL_TABLET | Freq: Two times a day (BID) | ORAL | Status: DC | PRN
  Administered 2023-08-28: 5 mg via ORAL
  Filled 2023-08-27 (×2): qty 1

## 2023-08-27 MED ORDER — OXYCODONE HCL 5 MG PO TABS: 5.0000 mg | ORAL_TABLET | Freq: Three times a day (TID) | ORAL | Status: DC | PRN

## 2023-08-27 MED ORDER — INFLUENZA VAC A&B SURF ANT ADJ 0.5 ML IM SUSY
0.5000 mL | PREFILLED_SYRINGE | INTRAMUSCULAR | Status: DC
  Filled 2023-08-27 (×2): qty 0.5

## 2023-08-27 MED ORDER — APIXABAN 2.5 MG PO TABS
5.0000 mg | ORAL_TABLET | Freq: Two times a day (BID) | ORAL | Status: DC
  Administered 2023-08-27 – 2023-08-29 (×5): 5 mg via ORAL
  Filled 2023-08-27 (×5): qty 2

## 2023-08-27 MED ORDER — OXYCODONE HCL 5 MG PO TABS
2.5000 mg | ORAL_TABLET | Freq: Three times a day (TID) | ORAL | Status: DC | PRN
  Administered 2023-08-27 – 2023-08-29 (×5): 2.5 mg via ORAL
  Filled 2023-08-27 (×5): qty 1

## 2023-08-27 NOTE — Progress Notes (Incomplete)
   HD#0 SUBJECTIVE:  Patient Summary: Mallory Estrada is a 82 y.o. with a pertinent PMH of Afib on Eliquis ,Dementia,Multiple Falls, who presented after a fall and admitted for cellulitis or right  lower extremity.  Overnight Events: ***  Interim History: ***  OBJECTIVE:  Vital Signs: Vitals:   08/27/23 0015 08/27/23 0452 08/27/23 0917 08/27/23 1958  BP: (!) 141/59 (!) 135/54 (!) 152/83 (!) 127/52  Pulse: 62 (!) 58  65  Resp: 20 18  18   Temp: 98 F (36.7 C) 98.3 F (36.8 C)  98 F (36.7 C)  TempSrc: Oral Oral Oral Oral  SpO2: 94% 93%  95%  Weight:      Height:       Supplemental O2: {NAMES:3044014::"Room Air","Nasal Cannula","Simple Face Mask","Partial Rebreather","HFNC","Non Rebreather","Venturi Mask","Bag Valve Mask"} SpO2: 95 %  Filed Weights   08/26/23 1140  Weight: 83.9 kg     Intake/Output Summary (Last 24 hours) at 08/27/2023 2014 Last data filed at 08/27/2023 1800 Gross per 24 hour  Intake --  Output 1150 ml  Net -1150 ml   Net IO Since Admission: -810 mL [08/27/23 2014]  Physical Exam: Physical Exam  Patient Lines/Drains/Airways Status     Active Line/Drains/Airways     Name Placement date Placement time Site Days   Peripheral IV 08/26/23 22 G 1.75" Anterior;Left Forearm 08/26/23  1714  Forearm  1   External Urinary Catheter 08/26/23  1227  --  1             ASSESSMENT/PLAN:  Assessment: Principal Problem:   Cellulitis of right lower extremity Active Problems:   Gait abnormality   Mild dementia, unclear etiology   Recurrent falls   Current use of long term anticoagulation   Plan: #Cellulitis of right lower Extremity ***  #Recurrent Falls 2/2 to gait abnormality  ***  Chronic Conditions #History of Atrial Fibrillation ***    #Hx of Diastolic Heart Failure Hx Of Hypertension    Best Practice: Diet: {CHL DISCHARGE DIET:21201} IVF: Fluids: {Meds; iv fluids:31617}, Rate: {NAMES:3044014::"None","*** cc/hr x *** hrs","***  cc bolus"} VTE: apixaban (ELIQUIS) tablet 5 mg Start: 08/27/23 1200 SCDs Start: 08/26/23 1756 Code: {NAMES:3044014::"Full","DNR","DNI","DNR/DNI","Comfort Care","Unknown"} AB: *** Therapy Recs: {NAMES:3044014::"None","Pending","CIR","SNF","ALF","LTAC","Home Health"}, DME: {Assistive Devices WUX:32440} Family Contact: ***, {Family:[id number]::"to be notified."} DISPO: Anticipated discharge {NAMES:3044014::"today","tomorrow","in *** days"} to {Discharge Destination:18313::"Home"} pending {BARRIERS TO DISCHARGE:24135}.  Signature: Kathleen Lime , MD Internal Medicine Resident, PGY-1 [facility]  Pager: [phone] 8:14 PM, 08/27/2023   Please contact the on call pager after 5 pm and on weekends at [phone].

## 2023-08-27 NOTE — Progress Notes (Signed)
Hospital day#0 Subjective:   Summary: Mallory Estrada is an 82 yo person with a history of paroxysmal atrial fibrillation on AC, mild dementia, recurrent falls, now living at [facility] facility, who presented after a mechanical fall without overt osseous injury or intracranial bleed on exam, now undergoing treatment for RLE cellulitis and awaiting PT/OT recommendations for safety transition to outpatient setting  Overnight Events: None   Slept well ON, waking up. Pain and rash improving. However, was not able to bear weight this am.  Lives in an apt by herself. She gets fed in the dinning room. She sleeps by herself. There are CNAs and a nurse on call. Family has been considering [facility] as a better place to go after that. Asking for help to navigate the process with her because she cannot talk to the places directly sometimes.    Objective:  Vital signs in last 24 hours: Vitals:   08/26/23 2055 08/26/23 2155 08/27/23 0015 08/27/23 0452  BP: (!) 175/71 (!) 149/51 (!) 141/59 (!) 135/54  Pulse: 64 (!) 58 62 (!) 58  Resp:   20 18  Temp:   98 F (36.7 C) 98.3 F (36.8 C)  TempSrc:   Oral Oral  SpO2: 97% 95% 94% 93%  Weight:      Height:       Supplemental O2: Room Air SpO2: 93 % Filed Weights   08/26/23 1140  Weight: 83.9 kg    Physical Exam:  Constitutional:well-appearing woman resting in bed in NAD HENT:Mild tenderness over posterior scalp Neck: supple Cardiovascular: regular rate and rhythm, no m/r/g Pulmonary/Chest: normal work of breathing on room air, lungs clear to auscultation bilaterally anteriorly and laterally. Unable to auscultate posteriorly today. No wheezing Abdominal: soft, non-tender, non-distended.  MSK: Limited movement on RUE. LUE  Neurological: alert & oriented x 3, +Dorsiflexion and plantear flexion on bilateral lower extremity. External and internal rotation with mild discomfort with Skin: warm and dry. Improvement in erythema and warmth of  the lower anterior shin compared to prior. Ecchymosis on R knee to R dorsum of the R foot with hemosiderin deposition now. Mild tenderness over mild tissude edema/ hematoma over R knee. No crepitus on exam Psych: Pleasant mood and affect    Intake/Output Summary (Last 24 hours) at 08/27/2023 0513 Last data filed at 08/26/2023 1954 Gross per 24 hour  Intake 340 ml  Output --  Net 340 ml   Net IO Since Admission: 340 mL [08/27/23 0513]  Pertinent Labs:    Latest Ref Rng & Units 08/27/2023    4:09 AM 08/26/2023    5:00 PM 08/26/2023   11:51 AM  CBC  WBC 4.0 - 10.5 K/uL 4.7  6.2    Hemoglobin 12.0 - 15.0 g/dL 40.9  81.1  91.4   Hematocrit 36.0 - 46.0 % 34.8  37.0  39.0   Platelets 150 - 400 K/uL 163  183         Latest Ref Rng & Units 08/27/2023    4:09 AM 08/26/2023   11:51 AM 03/15/2023    4:11 PM  CMP  Glucose 70 - 99 mg/dL 782  956  99   BUN 8 - 23 mg/dL 18  28  40   Creatinine 0.44 - 1.00 mg/dL 2.13  0.86  5.78   Sodium 135 - 145 mmol/L 142  142  139   Potassium 3.5 - 5.1 mmol/L 3.5  4.0  4.3   Chloride 98 - 111 mmol/L 108  105  105   CO2 22 - 32 mmol/L 25   27   Calcium 8.9 - 10.3 mg/dL 9.2   9.7   Total Protein 6.0 - 8.3 g/dL   6.9   Total Bilirubin 0.2 - 1.2 mg/dL   0.4   Alkaline Phos 39 - 117 U/L   58   AST 0 - 37 U/L   18   ALT 0 - 35 U/L   11     Imaging: DG Femur Min 2 Views Right  Result Date: 08/26/2023 CLINICAL DATA:  Pain after fall. EXAM: RIGHT FEMUR 2 VIEWS COMPARISON:  Same day radiographs of the right hip at 12:16 p.m. FINDINGS: No acute fracture identified. Status post right total hip arthroplasty. The femoral and acetabular components appear well seated with stable alignment. Status post right total knee arthroplasty with intact hardware in appropriate alignment. IMPRESSION: 1. No acute osseous abnormality. 2. Intact right total hip and total knee arthroplasties. Electronically Signed   By: Hart Robinsons M.D.   On: 08/26/2023 17:09   CT  Cervical Spine Wo Contrast  Result Date: 08/26/2023 CLINICAL DATA:  82 year old female status post fall backwards, on Eliquis. Struck back of head. Pain. EXAM: CT CERVICAL SPINE WITHOUT CONTRAST TECHNIQUE: Multidetector CT imaging of the cervical spine was performed without intravenous contrast. Multiplanar CT image reconstructions were also generated. RADIATION DOSE REDUCTION: This exam was performed according to the departmental dose-optimization program which includes automated exposure control, adjustment of the mA and/or kV according to patient size and/or use of iterative reconstruction technique. COMPARISON:  Head CT today. FINDINGS: Alignment: Straightening of cervical lordosis. Mild degenerative appearing spondylolisthesis at C4-C5, C5-C6, C7-T1. Bilateral posterior element alignment is within normal limits. Skull base and vertebrae: Bone mineralization is within normal limits for age. Visualized skull base is intact. No atlanto-occipital dissociation. C1-C2 appear chronically degenerated but intact and aligned. No acute osseous abnormality identified. Soft tissues and spinal canal: No prevertebral fluid or swelling. No visible canal hematoma. Negative visible noncontrast neck soft tissues except for calcified cervical carotid atherosclerosis. Disc levels: Widespread cervical spine degeneration. But generally capacious CT appearance of the underlying spinal canal. Mild spinal stenosis is possible at C5-C6 and C6-C7. Upper chest: Visible upper thoracic levels appear grossly intact. Mild apical lung scarring. Negative visible noncontrast thoracic inlet. IMPRESSION: 1. No acute traumatic injury identified in the cervical spine. 2. Chronic cervical spine degeneration. Electronically Signed   By: Odessa Fleming M.D.   On: 08/26/2023 12:53   DG Tibia/Fibula Right  Result Date: 08/26/2023 CLINICAL DATA:  82 year old female status post fall backwards, on Eliquis. Struck back of head. Pain. EXAM: RIGHT TIBIA AND  FIBULA - 2 VIEW COMPARISON:  None Available. FINDINGS: Right total knee arthroplasty. Hardware appears intact and aligned. Postoperative changes to the undersurface of the patella. No joint effusion on the cross-table lateral view. Underlying bone mineralization within normal limits for age. Maintained alignment at the right ankle. Right tibia and fibula appear intact. Calcaneus appears intact with mild degenerative spurring. Generalized lower extremity subcutaneous swelling, stranding. IMPRESSION: Generalized lower extremity soft tissue swelling. No acute fracture or dislocation identified about the right tib-fib. Right total knee arthroplasty with no adverse features. Electronically Signed   By: Odessa Fleming M.D.   On: 08/26/2023 12:50   DG Hip Unilat W or Wo Pelvis 2-3 Views Right  Result Date: 08/26/2023 CLINICAL DATA:  82 year old female status post fall backwards, on Eliquis. Struck back of head. Pain. EXAM: DG HIP (WITH OR WITHOUT  PELVIS) 2-3V RIGHT COMPARISON:  None Available. FINDINGS: Bilateral total hip arthroplasty and partially visible lumbar spine fusion hardware. Left femoral component incompletely visible. Normal AP alignment of both hips. No pelvis fracture identified. Proximal left femur appears grossly intact. Right hip hardware completely visible, intact and aligned. Proximal right femur appears intact. Negative visible bowel gas pattern, retained stool. IMPRESSION: Bilateral total hip arthroplasty. No acute fracture or dislocation identified about the pelvis or right hip. Electronically Signed   By: Odessa Fleming M.D.   On: 08/26/2023 12:48   CT Head Wo Contrast  Result Date: 08/26/2023 CLINICAL DATA:  82 year old female status post fall backwards, on Eliquis. Struck back of head. Pain. EXAM: CT HEAD WITHOUT CONTRAST TECHNIQUE: Contiguous axial images were obtained from the base of the skull through the vertex without intravenous contrast. RADIATION DOSE REDUCTION: This exam was performed  according to the departmental dose-optimization program which includes automated exposure control, adjustment of the mA and/or kV according to patient size and/or use of iterative reconstruction technique. COMPARISON:  Brain MRI 02/08/2022.  Head CT 10/13/2022. FINDINGS: Brain: Stable cerebral volume. No midline shift, mass effect, or evidence of intracranial mass lesion. No acute ventriculomegaly. Normal basilar cisterns. No acute intracranial hemorrhage identified. No cortically based acute infarct identified. Gray-white differentiation stable and within normal limits for age. Vascular: No suspicious intracranial vascular hyperdensity. Calcified atherosclerosis at the skull base. Skull: Stable.  No acute osseous abnormality identified. Sinuses/Orbits: Visualized paranasal sinuses and mastoids are stable and well aerated. Other: Broad-based posterior scalp hematoma tracking toward the vertex, up to 6 mm in thickness. Underlying calvarium appears stable and intact. No scalp soft tissue gas identified. Visualized orbit soft tissues are within normal limits. IMPRESSION: Posterior scalp hematoma. No skull fracture or acute intracranial abnormality. Electronically Signed   By: Odessa Fleming M.D.   On: 08/26/2023 12:46    Assessment/Plan:   Principal Problem:   Cellulitis of right lower extremity Active Problems:   Gait abnormality   Mild dementia, unclear etiology   Recurrent falls   Current use of long term anticoagulation  Mild uncomplicated cellulitis Afebrile. No leukocytosis today. **no signs of progression on RLE erythema. Will be transitioned to oral augmentin today. Low clinical concern for deeper tissue infection at this time. -S/p 1dose of CTX -S/p 1 dose of Augmentin, now transitioned to cephalexin q6HR until 10/30 -Continue to monitor  vital signs and WBC curve -MRSA swab pending -Follow up blood cultures  Mechanical fall on anticoagulation Mild dementia of unknown etiology Gait abnormality  with mobility impairment S/p bilateral hip and knee replacement Pain with movement overnight. This AM . Resolving ecchymosis over the RLE. Improving cellulitis on RLE. No point tenderness or evidence of hematoma over R greater trochanter area/R hip. Hgb 11.5 from 12.3; will continue to monitor. Discussed the side effect profil eof medications such as flexeril. Patient and daughter understand concerns, but would like to have the added relief at this time.  -PT and OT evaluation today; this will help [place] assess patient assistance needs -Suspect patient will need transition to SNF for subacute rehabilitation; family interested in [facility] -Pain control: scheduled tylenol 1000 mg TID, lidocaine patches -Oxycodone 2.5 mg q8HR PRN for 3 days -Flexeril 5 mg BID pRN -Delirium precautions  Hx of atrial fibrillation  Sinus bradycardia overnight, stable from prior notes. Discussed at bedside benefit of anticoagulation for decrease risk of cerebral infarction. -Restart Eliquis 2.5 mg BID -Monitor cardiac telemetry  Hypertension Diastolic HF No LE edema today -CTM  Diet: Heart Healthy VTE:  on Eliquis for FF Code: DNR/DNI PT/OT recs: Pending,  Family Update:Daughter at bedside  Morene Crocker, MD Internal Medicine Resident PGY-2 Please contact the on call pager after 5 pm and on weekends at [phone].

## 2023-08-27 NOTE — TOC Progression Note (Addendum)
Transition of Care [facility]) - Progression Note    Patient Details  Name: Mallory Estrada MRN: [id number] Date of Birth: 02-02-1941  Transition of Care [facility]) CM/SW Contact  Carmina Miller, [place] Phone Number: 08/27/2023, 4:44 PM  Clinical Narrative:     CSW spoke with pt's daughter, she states she is agreeable to SNF for pt, has preference for [facility]. Pt currently resides at [facility] and has had several falls. Pt's daughter states she is also looking to transition pt to LTC, CSW explained the process. CSW explained insurance auth process and Medicare.gov ratings site. CSW will also email pt's daughter [employer] contact information for assistance with LTC placement.        Expected Discharge Plan and Services                                               Social Determinants of Health (SDOH) Interventions SDOH Screenings   Food Insecurity: No Food Insecurity (08/27/2023)  Housing: Low Risk  (08/27/2023)  Transportation Needs: No Transportation Needs (08/27/2023)  Utilities: Not At Risk (08/27/2023)  Alcohol Screen: Low Risk  (11/25/2022)  Depression (PHQ2-9): Low Risk  (11/25/2022)  Financial Resource Strain: Low Risk  (03/14/2023)  Physical Activity: Inactive (03/14/2023)  Social Connections: Moderately Integrated (03/14/2023)  Stress: Patient Declined (03/14/2023)  Tobacco Use: Medium Risk (08/02/2023)    Readmission Risk Interventions     No data to display

## 2023-08-27 NOTE — Care Management Obs Status (Signed)
MEDICARE OBSERVATION STATUS NOTIFICATION   Patient Details  Name: Mallory Estrada MRN: [id number] Date of Birth: 16-Jan-1941   Medicare Observation Status Notification Given:  Yes    Ronny Bacon, RN 08/27/2023, 8:03 AM

## 2023-08-27 NOTE — Evaluation (Signed)
Occupational Therapy Evaluation Patient Details Name: Mallory Estrada MRN: [id number] DOB: 10-10-1941 Today's Date: 08/27/2023   History of Present Illness Pt is an 82yo female presenting to [facility] s/p fall found to have RLE cellulitis. Of note: this is the 6th fall this year. Imaging negative for acute abnormality  PMH: afib, gait abnormality, HLD, hypothyroidism, migraine, dementia, R frozen shoulder, bilateral TKAs and THAs.   Clinical Impression   At baseline, pt resides at an ALF and receives assistance for bathing, dressing, and IADLs. At baseline, pt largely performs functional mobility Mod I from wheelchair level and performs functional transfers with an AD. Pt now presents with decreased activity tolerance, decreased B UE strength, decreased balance during functional tasks, pain affecting functional level, and decreased safety and independence with functional tasks. Pt currently demonstrates ability to complete UB ADLs with Min to Max assist, LB ADLs with Total assist +2, and step-pivot transfers with Mod assist +2. Pt requires step-by-step cues throughout tasks. Pt will benefit form acute skilled OT services to address deficits outlined below and increase safety and independence with functional tasks. Post acute discharge, pt will benefit from intensive inpatient skilled rehab services < 3 hours per day to maximize rehab potential.       If plan is discharge home, recommend the following: Two people to help with walking and/or transfers;Two people to help with bathing/dressing/bathroom;Assistance with cooking/housework;Direct supervision/assist for medications management;Direct supervision/assist for financial management;Assistance with feeding;Assist for transportation;Help with stairs or ramp for entrance;Supervision due to cognitive status    Functional Status Assessment  Patient has had a recent decline in their functional status and demonstrates the ability to make significant  improvements in function in a reasonable and predictable amount of time.  Equipment Recommendations  Other (comment) (defer to next level of care)    Recommendations for Other Services       Precautions / Restrictions Precautions Precautions: Fall Restrictions Weight Bearing Restrictions: No      Mobility Bed Mobility Overal bed mobility: Needs Assistance Bed Mobility: Supine to Sit     Supine to sit: HOB elevated, +2 for physical assistance, +2 for safety/equipment, Mod assist, Used rails     General bed mobility comments: Assist provided to bring BLE off and elevate trunk. CGA upto maxA to prevent retropulsion while sitting EOB    Transfers Overall transfer level: Needs assistance Equipment used: Rolling walker (2 wheels) Transfers: Sit to/from Stand, Bed to chair/wheelchair/BSC Sit to Stand: Mod assist, +2 physical assistance, +2 safety/equipment, From elevated surface     Step pivot transfers: Mod assist     General transfer comment: Pt required up to modA+2 for lift assistance for STS and then intermittent mod to prevent retropulsion. Pt able to remain stainding for orthostatic vitals with CGA      Balance Overall balance assessment: Needs assistance Sitting-balance support: Feet supported, No upper extremity supported Sitting balance-Leahy Scale: Zero Sitting balance - Comments: Zero tp Poor; requires fluctuating CGA to maxA to prevent retropulsion Postural control: Posterior lean Standing balance support: Bilateral upper extremity supported, During functional activity, Reliant on assistive device for balance Standing balance-Leahy Scale: Poor Standing balance comment: modA                           ADL either performed or assessed with clinical judgement   ADL Overall ADL's : Needs assistance/impaired Eating/Feeding: Sitting;Minimal assistance;Moderate assistance;Cueing for sequencing (in recliner)   Grooming: Moderate assistance;Sitting;Cueing  for sequencing (in recliner)  Upper Body Bathing: Maximal assistance;Bed level;Cueing for sequencing   Lower Body Bathing: Total assistance;+2 for physical assistance;+2 for safety/equipment;Bed level;Cueing for sequencing   Upper Body Dressing : Minimal assistance;Sitting (in recliner)   Lower Body Dressing: Total assistance;+2 for physical assistance;+2 for safety/equipment;Bed level;Cueing for sequencing   Toilet Transfer: Moderate assistance;+2 for physical assistance;+2 for safety/equipment;BSC/3in1;Rolling walker (2 wheels);Cueing for safety;Cueing for sequencing (step-pivot)   Toileting- Clothing Manipulation and Hygiene: Total assistance;+2 for physical assistance;+2 for safety/equipment;Sit to/from stand;Cueing for safety;Cueing for sequencing         General ADL Comments: Pt requires step-by-step cues for all ADLs. Pt presents with decreased activity tolerance and pain affecting funcitonal level.     Vision Baseline Vision/History: 1 Wears glasses Ability to See in Adequate Light: 0 Adequate Patient Visual Report: No change from baseline       Perception         Praxis         Pertinent Vitals/Pain Pain Assessment Pain Assessment: 0-10 Pain Score: 8  Pain Location: RLE Pain Descriptors / Indicators: Discomfort, Grimacing, Guarding Pain Intervention(s): Limited activity within patient's tolerance, Monitored during session, Repositioned, Premedicated before session     Extremity/Trunk Assessment Upper Extremity Assessment Upper Extremity Assessment: Right hand dominant;Generalized weakness;RUE deficits/detail;LUE deficits/detail (often uses Left hand due to R UE deficits) RUE Deficits / Details: Frozen shoulder at baseline with limited shoulder AROM and AAROM shoulder flexion to approx. 70 degress; generalized weakness; decreased coordination RUE Coordination: decreased gross motor;decreased fine motor LUE Deficits / Details: generalized weakness; decreased  coordination LUE Coordination: decreased gross motor;decreased fine motor   Lower Extremity Assessment Lower Extremity Assessment: Defer to PT evaluation RLE Deficits / Details: Deferred secondary to pain, existing bruising distal LE. Functional ROM, MMT DNT. RLE Sensation: WNL LLE Deficits / Details: Pt unable to resist but able to perform ROM LLE Sensation: WNL   Cervical / Trunk Assessment Cervical / Trunk Assessment: Kyphotic   Communication Communication Communication: Difficulty following commands/understanding;Difficulty communicating thoughts/reduced clarity of speech;Other (comment) (Pt benefits from use of short, simple sentences and increased time for response.) Following commands: Follows one step commands consistently;Follows one step commands with increased time Cueing Techniques: Verbal cues;Tactile cues   Cognition Arousal: Alert Behavior During Therapy: WFL for tasks assessed/performed, Anxious Overall Cognitive Status: History of cognitive impairments - at baseline                                 General Comments: Pt alert and oriented to self and daughter only, with cuing able to name [facility] hospital and date, able to name president.     General Comments  Daughter, [name], present throughout session.    Exercises     Shoulder Instructions      Home Living Family/patient expects to be discharged to:: Assisted living                             Home Equipment: Rolling Walker (2 wheels);Wheelchair - manual;Shower seat   Additional Comments: Walks to dining room for meals. Requires help for dressing/bathing (total assist)      Prior Functioning/Environment Prior Level of Function : Needs assist;History of Falls (last six months)  Cognitive Assist : Mobility (cognitive);ADLs (cognitive) Mobility (Cognitive): Step by step cues ADLs (Cognitive): Step by step cues Physical Assist : Mobility (physical);ADLs (physical) Mobility  (physical): Bed mobility;Transfers;Gait ADLs (physical): Grooming;Bathing;Dressing;Toileting Mobility Comments:  Pt performs functional mobility Mod I from [facility] level most of the time using B LE to self-propel and uses a RW for transfers. ADLs Comments: At baseline, pt recieves assistnace for bathing, dressing, and IADLs.        OT Problem List: Decreased strength;Decreased activity tolerance;Impaired balance (sitting and/or standing);Decreased coordination;Pain      OT Treatment/Interventions: Self-care/ADL training;Therapeutic exercise;DME and/or AE instruction;Therapeutic activities;Patient/family education;Balance training;Cognitive remediation/compensation    OT Goals(Current goals can be found in the care plan section) Acute Rehab OT Goals Patient Stated Goal: to feel better and stay active OT Goal Formulation: With patient/family Time For Goal Achievement: 09/10/23 Potential to Achieve Goals: Fair ADL Goals Pt Will Perform Grooming: with contact guard assist;sitting (sitting EOB with Fair balance for 3 or more minutes) Pt Will Perform Upper Body Dressing: with supervision;sitting (sitting EOB with Fair balance) Pt Will Transfer to Toilet: bedside commode;ambulating;with contact guard assist (with least restrictive AD) Pt Will Perform Toileting - Clothing Manipulation and hygiene: with mod assist;sit to/from stand;sitting/lateral leans  OT Frequency: Min 1X/week    Co-evaluation PT/OT/SLP Co-Evaluation/Treatment: Yes Reason for Co-Treatment: For patient/therapist safety;To address functional/ADL transfers PT goals addressed during session: Mobility/safety with mobility OT goals addressed during session: ADL's and self-care      AM-PAC OT "6 Clicks" Daily Activity     Outcome Measure Help from another person eating meals?: A Lot Help from another person taking care of personal grooming?: A Lot Help from another person toileting, which includes using toliet, bedpan, or urinal?:  Total Help from another person bathing (including washing, rinsing, drying)?: A Lot Help from another person to put on and taking off regular upper body clothing?: A Little Help from another person to put on and taking off regular lower body clothing?: Total 6 Click Score: 11   End of Session Equipment Utilized During Treatment: Gait belt;Rolling walker (2 wheels) Nurse Communication: Mobility status  Activity Tolerance: Patient tolerated treatment well;Patient limited by fatigue;Patient limited by pain Patient left: in chair;with call bell/phone within reach;with family/visitor present  OT Visit Diagnosis: Unsteadiness on feet (R26.81);Other abnormalities of gait and mobility (R26.89);Pain;Muscle weakness (generalized) (M62.81)                Time: 1440-1540 OT Time Calculation (min): 60 min Charges:  OT General Charges $OT Visit: 1 Visit OT Evaluation $OT Eval Moderate Complexity: 1 Mod OT Treatments $Self Care/Home Management : 8-22 mins  Charmine Bockrath "Orson Eva., OTR/L, MA Acute Rehab [phone]   Lendon Colonel 08/27/2023, 6:24 PM

## 2023-08-27 NOTE — NC FL2 (Signed)
[place] MEDICAID [facility] LEVEL OF CARE FORM     IDENTIFICATION  Patient Name: Mallory Estrada Birthdate: 12/17/1940 Sex: female Admission Date (Current Location): 08/26/2023  [facility] and [place] Number:  Producer, television/film/video and Address:  [facility], [address], [place], [place] [address]      Provider Number: [id number]  Attending Physician Name and Address:  Tyson Alias, *  Relative Name and Phone Number:  [name] [phone]    Current Level of Care: Hospital Recommended Level of Care: Skilled Nursing Facility Prior Approval Number:    Date Approved/Denied:   PASRR Number: [id number]  Discharge Plan: SNF    Current Diagnoses: Patient Active Problem List   Diagnosis Date Noted   Cellulitis of right lower extremity 08/26/2023   Recurrent falls 08/26/2023   Current use of long term anticoagulation 08/26/2023   Pulse irregularity 03/16/2023   Healthcare maintenance 11/28/2022   Constipation 11/28/2022   SUI (stress urinary incontinence, female) 09/15/2022   Mild dementia, unclear etiology 09/15/2022   Pulmonary nodule 06/09/2022   Insomnia 02/18/2022   H/O laminectomy 01/14/2022   Headache 11/02/2021   Left-sided chest wall pain 10/13/2021   Toe pain 09/02/2021   Shoulder pain 09/02/2021   Arthritis of finger of both hands 06/17/2021   Recurrent UTI 03/26/2021   Hip arthritis    Refusal of blood transfusions as patient is Jehovah's Witness 12/29/2020   Advance care planning 12/08/2020   Hypothyroidism    Hyperlipidemia    Gait abnormality    Atrial fibrillation     Orientation RESPIRATION BLADDER Height & Weight     Place, Self  Normal Incontinent, External catheter Weight: 185 lb (83.9 kg) Height:  5\' 4"  (162.6 cm)  BEHAVIORAL SYMPTOMS/MOOD NEUROLOGICAL BOWEL NUTRITION STATUS      Continent Diet (see dc summary)  AMBULATORY STATUS COMMUNICATION OF NEEDS Skin   Extensive Assist   Normal                        Personal Care Assistance Level of Assistance  Bathing, Feeding, Dressing Bathing Assistance: Limited assistance Feeding assistance: Limited assistance Dressing Assistance: Limited assistance     Functional Limitations Info  Sight, Hearing, Speech Sight Info: Adequate Hearing Info: Adequate Speech Info: Adequate    SPECIAL CARE FACTORS FREQUENCY  OT (By licensed OT), PT (By licensed PT)     PT Frequency: 5x week OT Frequency: 5x week            Contractures Contractures Info: Not present    Additional Factors Info  Code Status, Allergies, Psychotropic   Allergies Info: Fentanyl  Gabapentin  Ketamine  Macrobid (Nitrofurantoin)  Memantine  Sulfa Antibiotics  Trazodone And Nefazodone  Anesthetics, Amide Psychotropic Info: sertraline (ZOLOFT) tablet 25 mg    once a day         Current Medications (08/27/2023):  This is the current hospital active medication list Current Facility-Administered Medications  Medication Dose Route Frequency Provider Last Rate Last Admin   acetaminophen (TYLENOL) tablet 1,000 mg  1,000 mg Oral Q8H Morene Crocker, MD   1,000 mg at 08/27/23 1247   apixaban (ELIQUIS) tablet 5 mg  5 mg Oral BID Morene Crocker, MD   5 mg at 08/27/23 1248   atorvastatin (LIPITOR) tablet 10 mg  10 mg Oral Daily Morene Crocker, MD   10 mg at 08/27/23 0843   cephALEXin (KEFLEX) capsule 500 mg  500 mg Oral Q6H  Morene Crocker, MD   500 mg at 08/27/23 1248   cholecalciferol (VITAMIN D3) 25 MCG (1000 UNIT) tablet 5,000 Units  5,000 Units Oral Daily Morene Crocker, MD   5,000 Units at 08/27/23 0843   cyclobenzaprine (FLEXERIL) tablet 5 mg  5 mg Oral BID PRN Morene Crocker, MD       [START ON 08/28/2023] influenza vaccine adjuvanted (FLUAD) injection 0.5 mL  0.5 mL Intramuscular Tomorrow-1000 Tyson Alias, MD       levothyroxine (SYNTHROID) tablet 50 mcg  50 mcg Oral Q0600 Morene Crocker, MD   50 mcg at  08/27/23 0549   lidocaine (LIDODERM) 5 % 1 patch  1 patch Transdermal Q24H Morene Crocker, MD   1 patch at 08/26/23 2021   lidocaine (LIDODERM) 5 % 1 patch  1 patch Transdermal Q24H Morene Crocker, MD   1 patch at 08/26/23 2113   magnesium gluconate (MAGONATE) tablet 500 mg  500 mg Oral QPM Morene Crocker, MD   500 mg at 08/26/23 2055   oxyCODONE (Oxy IR/ROXICODONE) immediate release tablet 2.5 mg  2.5 mg Oral Q8H PRN Morene Crocker, MD       polyethylene glycol (MIRALAX / GLYCOLAX) packet 17 g  17 g Oral Daily PRN Morene Crocker, MD   17 g at 08/27/23 8657   senna-docusate (Senokot-S) tablet 2 tablet  2 tablet Oral Daily Morene Crocker, MD   2 tablet at 08/27/23 8469   sertraline (ZOLOFT) tablet 25 mg  25 mg Oral QPM Morene Crocker, MD   25 mg at 08/26/23 2021     Discharge Medications: Please see discharge summary for a list of discharge medications.  Relevant Imaging Results:  Relevant Lab Results:   Additional Information SSN [id number]  Mallory Estrada, LCSWA

## 2023-08-27 NOTE — Evaluation (Signed)
Physical Therapy Evaluation Patient Details Name: Mallory Estrada MRN: [id number] DOB: 18-Dec-1940 Today's Date: 08/27/2023  History of Present Illness  Pt is an 82yo female [facility] to [facility] s/p fall found to have RLE cellulitis. Of note: this is the 6th fall this year. Imaging negative for acute abnormality  PMH: afib, gait abnormality, HLD, hypothyroidism, migraine, dementia, R frozen shoulder, bilateral TKAs and THAs.  Clinical Impression  Pt admitted with above diagnosis. Pt currently with functional limitations due to the deficits listed below (see PT Problem List). Pt required modA+2 for bed mobility and transfers bed to chair with RW, pt reporting 8/10 RLE. Orthostatic vitals and were negative. Pt will benefit from acute skilled PT to increase their independence and safety with mobility to allow discharge.  Patient will benefit from intensive inpatient follow up therapy, <3 hours/day. We will continue to follow acutely.    08/27/23 1500  Orthostatic Lying   BP- Lying 120/63  Pulse- Lying 72  Orthostatic Sitting  BP- Sitting 119/72  Pulse- Sitting 72  Orthostatic Standing at 0 minutes  BP- Standing at 0 minutes (!) 135/107  Orthostatic Standing at 3 minutes  BP- Standing at 3 minutes 127/73  Pulse- Standing at 3 minutes 75        If plan is discharge home, recommend the following: Help with stairs or ramp for entrance;Supervision due to cognitive status;Assist for transportation;Assistance with cooking/housework;A little help with walking and/or transfers;A little help with bathing/dressing/bathroom   Can travel by private vehicle   Yes    Equipment Recommendations None recommended by PT (TBD at SNF)  Recommendations for Other Services       Functional Status Assessment Patient has had a recent decline in their functional status and demonstrates the ability to make significant improvements in function in a reasonable and predictable amount of time.     Precautions /  Restrictions Precautions Precautions: Fall Restrictions Weight Bearing Restrictions: No      Mobility  Bed Mobility Overal bed mobility: Needs Assistance Bed Mobility: Supine to Sit     Supine to sit: HOB elevated, +2 for physical assistance, +2 for safety/equipment, Mod assist, Used rails     General bed mobility comments: Assist provided to bring BLE off and elevate trunk. CGA upto maxA to prevent retropulsion while sitting EOB    Transfers Overall transfer level: Needs assistance Equipment used: Rolling walker (2 wheels) Transfers: Sit to/from Stand, Bed to chair/wheelchair/BSC Sit to Stand: Mod assist, +2 physical assistance, +2 safety/equipment, From elevated surface   Step pivot transfers: Mod assist       General transfer comment: Pt required up to modA+2 for lift assistance for STS and then intermittent mod to prevent retropulsion. Pt able to remain stainding for orthostatic vitals with CGA    Ambulation/Gait               General Gait Details: Unsafe at present  Stairs            Wheelchair Mobility     Tilt Bed    Modified Rankin (Stroke Patients Only)       Balance Overall balance assessment: Needs assistance Sitting-balance support: Feet supported, No upper extremity supported Sitting balance-Leahy Scale: Zero Sitting balance - Comments: requires maxA to prevent retropulsion Postural control: Posterior lean Standing balance support: Bilateral upper extremity supported, During functional activity, Reliant on assistive device for balance Standing balance-Leahy Scale: Poor Standing balance comment: modA  Pertinent Vitals/Pain Pain Assessment Pain Assessment: 0-10 Pain Score: 8  Pain Location: RLE Pain Descriptors / Indicators: Discomfort Pain Intervention(s): Limited activity within patient's tolerance, Monitored during session, Repositioned    Home Living Family/patient expects to be  discharged to:: Assisted living                 Home Equipment: Agricultural consultant (2 wheels);Wheelchair - manual Additional Comments: Walks to dining room for meals. Requires help for dressing/bathing (total assist)    Prior Function Prior Level of Function : Needs assist;History of Falls (last six months)  Cognitive Assist : Mobility (cognitive);ADLs (cognitive) Mobility (Cognitive): Step by step cues ADLs (Cognitive): Step by step cues Physical Assist : Mobility (physical);ADLs (physical) Mobility (physical): Bed mobility;Transfers;Gait ADLs (physical): Grooming;Bathing;Dressing;Toileting Mobility Comments: WC most of the time, RW for transfers ADLs Comments: Assist for bathing and dressing     Extremity/Trunk Assessment   Upper Extremity Assessment Upper Extremity Assessment: Defer to OT evaluation    Lower Extremity Assessment Lower Extremity Assessment: RLE deficits/detail;LLE deficits/detail;Generalized weakness RLE Deficits / Details: Deferred secondary to pain, existing bruising distal LE. Functional ROM, MMT DNT. RLE Sensation: WNL LLE Deficits / Details: Pt unable to resist but able to perform ROM LLE Sensation: WNL    Cervical / Trunk Assessment Cervical / Trunk Assessment: Kyphotic  Communication   Communication Communication: Difficulty following commands/understanding;Difficulty communicating thoughts/reduced clarity of speech Following commands: Follows one step commands consistently Cueing Techniques: Verbal cues;Tactile cues  Cognition Arousal: Alert Behavior During Therapy: WFL for tasks assessed/performed, Anxious Overall Cognitive Status: History of cognitive impairments - at baseline                                 General Comments: Pt alert and oriented to self only, with cuing able to name [facility] hospital and date, able to name president.        General Comments General comments (skin integrity, edema, etc.): Daughter [name]  present    Exercises     Assessment/Plan    PT Assessment Patient needs continued PT services  PT Problem List Decreased strength;Decreased range of motion;Decreased activity tolerance;Decreased balance;Decreased mobility;Decreased coordination;Decreased cognition;Decreased knowledge of use of DME;Decreased safety awareness;Pain       PT Treatment Interventions DME instruction;Gait training;Stair training;Functional mobility training;Therapeutic activities;Therapeutic exercise;Balance training;Neuromuscular re-education;Cognitive remediation;Patient/family education;Wheelchair mobility training    PT Goals (Current goals can be found in the Care Plan section)  Acute Rehab PT Goals Patient Stated Goal: To go home PT Goal Formulation: With patient Time For Goal Achievement: 09/10/23 Potential to Achieve Goals: Good    Frequency 7X/week     Co-evaluation PT/OT/SLP Co-Evaluation/Treatment: Yes Reason for Co-Treatment: For patient/therapist safety;To address functional/ADL transfers PT goals addressed during session: Mobility/safety with mobility OT goals addressed during session: ADL's and self-care       AM-PAC PT "6 Clicks" Mobility  Outcome Measure Help needed turning from your back to your side while in a flat bed without using bedrails?: A Lot Help needed moving from lying on your back to sitting on the side of a flat bed without using bedrails?: A Lot Help needed moving to and from a bed to a chair (including a wheelchair)?: A Lot Help needed standing up from a chair using your arms (e.g., wheelchair or bedside chair)?: Total Help needed to walk in hospital room?: Total Help needed climbing 3-5 steps with a railing? : Total 6 Click Score: 9  End of Session Equipment Utilized During Treatment: Gait belt Activity Tolerance: Patient tolerated treatment well;No increased pain Patient left: in chair;with call bell/phone within reach;with chair alarm set;with  family/visitor present Nurse Communication: Mobility status PT Visit Diagnosis: Pain;History of falling (Z91.81);Other abnormalities of gait and mobility (R26.89) Pain - Right/Left: Right Pain - part of body: Leg    Time: 1440-1540 PT Time Calculation (min) (ACUTE ONLY): 60 min   Charges:   PT Evaluation $PT Eval Moderate Complexity: 1 Mod PT Treatments $Therapeutic Activity: 8-22 mins PT General Charges $$ ACUTE PT VISIT: 1 Visit        Jamesetta Geralds, PT, DPT [facility] Rehabilitation Department Office: [phone]  Jamesetta Geralds 08/27/2023, 3:40 PM

## 2023-08-28 ENCOUNTER — Encounter (HOSPITAL_COMMUNITY): Payer: Self-pay | Admitting: Student in an Organized Health Care Education/Training Program

## 2023-08-28 DIAGNOSIS — L03115 Cellulitis of right lower limb: Secondary | ICD-10-CM | POA: Diagnosis not present

## 2023-08-28 LAB — CBC
HCT: 36 % (ref 36.0–46.0)
Hemoglobin: 12 g/dL (ref 12.0–15.0)
MCH: 30.7 pg (ref 26.0–34.0)
MCHC: 33.3 g/dL (ref 30.0–36.0)
MCV: 92.1 fL (ref 80.0–100.0)
Platelets: 159 10*3/uL (ref 150–400)
RBC: 3.91 MIL/uL (ref 3.87–5.11)
RDW: 13.1 % (ref 11.5–15.5)
WBC: 5.1 10*3/uL (ref 4.0–10.5)
nRBC: 0 % (ref 0.0–0.2)

## 2023-08-28 LAB — GLUCOSE, CAPILLARY
Glucose-Capillary: 102 mg/dL — ABNORMAL HIGH (ref 70–99)
Glucose-Capillary: 128 mg/dL — ABNORMAL HIGH (ref 70–99)

## 2023-08-28 MED ORDER — ONDANSETRON HCL 4 MG PO TABS
4.0000 mg | ORAL_TABLET | Freq: Once | ORAL | Status: AC
  Administered 2023-08-28: 4 mg via ORAL
  Filled 2023-08-28: qty 1

## 2023-08-28 MED ORDER — DICLOFENAC SODIUM 1 % EX GEL
4.0000 g | Freq: Four times a day (QID) | CUTANEOUS | Status: DC
  Administered 2023-08-28 – 2023-08-29 (×3): 4 g via TOPICAL
  Filled 2023-08-28: qty 100

## 2023-08-28 MED ORDER — OXYCODONE HCL 5 MG PO TABS
2.5000 mg | ORAL_TABLET | Freq: Once | ORAL | Status: AC
  Administered 2023-08-28: 2.5 mg via ORAL
  Filled 2023-08-28: qty 1

## 2023-08-28 NOTE — TOC Progression Note (Addendum)
Transition of Care [facility]) - Progression Note    Patient Details  Name: Mallory Estrada MRN: [id number] Date of Birth: 12/07/1940  [facility]) CM/SW Contact  Patrice Paradise, LCSW Phone Number: 08/28/2023, 10:21 AM  Clinical Narrative:     CSW was alerted that pt is pending DC therefore followed up with pt's daughter in regards to bed offers. CSW is awaiting a response.  10:35am- CSW received a return call from pt's daughter and provided bed offers. Pt's daughter chose [facility]. CSW spoke [place] and she said to call back tomorrow to confirm if they will have discharges.  CSW started authorization for pt with [facility] as facility.. Reference # [id number].  TOC team will continue to assist with discharge planning needs.        Expected Discharge Plan and Services                                               Social Determinants of Health (SDOH) Interventions SDOH Screenings   Food Insecurity: No Food Insecurity (08/27/2023)  Housing: Low Risk  (08/27/2023)  Transportation Needs: No Transportation Needs (08/27/2023)  Utilities: Not At Risk (08/27/2023)  Alcohol Screen: Low Risk  (11/25/2022)  Depression (PHQ2-9): Low Risk  (11/25/2022)  Financial Resource Strain: Low Risk  (03/14/2023)  Physical Activity: Inactive (03/14/2023)  Social Connections: Moderately Integrated (03/14/2023)  Stress: Patient Declined (03/14/2023)  Tobacco Use: Medium Risk (08/02/2023)    Readmission Risk Interventions     No data to display

## 2023-08-29 ENCOUNTER — Encounter (HOSPITAL_COMMUNITY): Payer: Self-pay | Admitting: Student in an Organized Health Care Education/Training Program

## 2023-08-29 DIAGNOSIS — R0789 Other chest pain: Secondary | ICD-10-CM | POA: Diagnosis not present

## 2023-08-29 DIAGNOSIS — R140 Abdominal distension (gaseous): Secondary | ICD-10-CM | POA: Diagnosis not present

## 2023-08-29 DIAGNOSIS — S0003XA Contusion of scalp, initial encounter: Secondary | ICD-10-CM | POA: Diagnosis not present

## 2023-08-29 DIAGNOSIS — R2689 Other abnormalities of gait and mobility: Secondary | ICD-10-CM | POA: Diagnosis not present

## 2023-08-29 DIAGNOSIS — I5030 Unspecified diastolic (congestive) heart failure: Secondary | ICD-10-CM | POA: Diagnosis not present

## 2023-08-29 DIAGNOSIS — I110 Hypertensive heart disease with heart failure: Secondary | ICD-10-CM | POA: Diagnosis not present

## 2023-08-29 DIAGNOSIS — M1909 Primary osteoarthritis, other specified site: Secondary | ICD-10-CM | POA: Diagnosis not present

## 2023-08-29 DIAGNOSIS — R41841 Cognitive communication deficit: Secondary | ICD-10-CM | POA: Diagnosis not present

## 2023-08-29 DIAGNOSIS — N952 Postmenopausal atrophic vaginitis: Secondary | ICD-10-CM | POA: Diagnosis not present

## 2023-08-29 DIAGNOSIS — M6281 Muscle weakness (generalized): Secondary | ICD-10-CM | POA: Diagnosis not present

## 2023-08-29 DIAGNOSIS — I4891 Unspecified atrial fibrillation: Secondary | ICD-10-CM | POA: Diagnosis not present

## 2023-08-29 DIAGNOSIS — R0989 Other specified symptoms and signs involving the circulatory and respiratory systems: Secondary | ICD-10-CM | POA: Diagnosis not present

## 2023-08-29 DIAGNOSIS — R296 Repeated falls: Secondary | ICD-10-CM | POA: Diagnosis not present

## 2023-08-29 DIAGNOSIS — M25559 Pain in unspecified hip: Secondary | ICD-10-CM | POA: Diagnosis not present

## 2023-08-29 DIAGNOSIS — R220 Localized swelling, mass and lump, head: Secondary | ICD-10-CM | POA: Diagnosis not present

## 2023-08-29 DIAGNOSIS — F32A Depression, unspecified: Secondary | ICD-10-CM | POA: Diagnosis not present

## 2023-08-29 DIAGNOSIS — L03115 Cellulitis of right lower limb: Secondary | ICD-10-CM | POA: Diagnosis not present

## 2023-08-29 DIAGNOSIS — Z79899 Other long term (current) drug therapy: Secondary | ICD-10-CM | POA: Diagnosis not present

## 2023-08-29 DIAGNOSIS — N390 Urinary tract infection, site not specified: Secondary | ICD-10-CM | POA: Diagnosis not present

## 2023-08-29 DIAGNOSIS — M542 Cervicalgia: Secondary | ICD-10-CM | POA: Diagnosis not present

## 2023-08-29 DIAGNOSIS — F0390 Unspecified dementia without behavioral disturbance: Secondary | ICD-10-CM | POA: Diagnosis not present

## 2023-08-29 DIAGNOSIS — R4182 Altered mental status, unspecified: Secondary | ICD-10-CM | POA: Diagnosis not present

## 2023-08-29 DIAGNOSIS — R410 Disorientation, unspecified: Secondary | ICD-10-CM | POA: Diagnosis not present

## 2023-08-29 DIAGNOSIS — R404 Transient alteration of awareness: Secondary | ICD-10-CM | POA: Diagnosis not present

## 2023-08-29 DIAGNOSIS — W19XXXA Unspecified fall, initial encounter: Secondary | ICD-10-CM | POA: Diagnosis not present

## 2023-08-29 DIAGNOSIS — M4802 Spinal stenosis, cervical region: Secondary | ICD-10-CM | POA: Diagnosis not present

## 2023-08-29 DIAGNOSIS — Y9301 Activity, walking, marching and hiking: Secondary | ICD-10-CM | POA: Diagnosis not present

## 2023-08-29 DIAGNOSIS — I10 Essential (primary) hypertension: Secondary | ICD-10-CM | POA: Diagnosis not present

## 2023-08-29 DIAGNOSIS — L0390 Cellulitis, unspecified: Secondary | ICD-10-CM | POA: Diagnosis not present

## 2023-08-29 DIAGNOSIS — R278 Other lack of coordination: Secondary | ICD-10-CM | POA: Diagnosis not present

## 2023-08-29 DIAGNOSIS — Z7401 Bed confinement status: Secondary | ICD-10-CM | POA: Diagnosis not present

## 2023-08-29 DIAGNOSIS — M8448XA Pathological fracture, other site, initial encounter for fracture: Secondary | ICD-10-CM | POA: Diagnosis not present

## 2023-08-29 DIAGNOSIS — Z7901 Long term (current) use of anticoagulants: Secondary | ICD-10-CM | POA: Diagnosis not present

## 2023-08-29 DIAGNOSIS — M79604 Pain in right leg: Secondary | ICD-10-CM | POA: Diagnosis not present

## 2023-08-29 DIAGNOSIS — M4312 Spondylolisthesis, cervical region: Secondary | ICD-10-CM | POA: Diagnosis not present

## 2023-08-29 DIAGNOSIS — E785 Hyperlipidemia, unspecified: Secondary | ICD-10-CM | POA: Diagnosis not present

## 2023-08-29 DIAGNOSIS — R2681 Unsteadiness on feet: Secondary | ICD-10-CM | POA: Diagnosis not present

## 2023-08-29 DIAGNOSIS — E039 Hypothyroidism, unspecified: Secondary | ICD-10-CM | POA: Diagnosis not present

## 2023-08-29 DIAGNOSIS — R519 Headache, unspecified: Secondary | ICD-10-CM | POA: Diagnosis not present

## 2023-08-29 DIAGNOSIS — M6259 Muscle wasting and atrophy, not elsewhere classified, multiple sites: Secondary | ICD-10-CM | POA: Diagnosis not present

## 2023-08-29 DIAGNOSIS — R079 Chest pain, unspecified: Secondary | ICD-10-CM | POA: Diagnosis not present

## 2023-08-29 DIAGNOSIS — R531 Weakness: Secondary | ICD-10-CM | POA: Diagnosis not present

## 2023-08-29 LAB — GLUCOSE, CAPILLARY
Glucose-Capillary: 104 mg/dL — ABNORMAL HIGH (ref 70–99)
Glucose-Capillary: 110 mg/dL — ABNORMAL HIGH (ref 70–99)

## 2023-08-29 LAB — CBC
HCT: 35.8 % — ABNORMAL LOW (ref 36.0–46.0)
Hemoglobin: 12 g/dL (ref 12.0–15.0)
MCH: 31.4 pg (ref 26.0–34.0)
MCHC: 33.5 g/dL (ref 30.0–36.0)
MCV: 93.7 fL (ref 80.0–100.0)
Platelets: 164 10*3/uL (ref 150–400)
RBC: 3.82 MIL/uL — ABNORMAL LOW (ref 3.87–5.11)
RDW: 12.9 % (ref 11.5–15.5)
WBC: 5.3 10*3/uL (ref 4.0–10.5)
nRBC: 0 % (ref 0.0–0.2)

## 2023-08-29 MED ORDER — KETOROLAC TROMETHAMINE 10 MG PO TABS: 10.0000 mg | ORAL_TABLET | Freq: Four times a day (QID) | ORAL | Status: AC | PRN

## 2023-08-29 MED ORDER — KETOROLAC TROMETHAMINE 15 MG/ML IJ SOLN
15.0000 mg | Freq: Once | INTRAMUSCULAR | Status: AC
  Administered 2023-08-29: 15 mg via INTRAVENOUS
  Filled 2023-08-29: qty 1

## 2023-08-29 MED ORDER — DICLOFENAC SODIUM 1 % EX GEL: 4.0000 g | Freq: Four times a day (QID) | CUTANEOUS | Status: DC

## 2023-08-29 MED ORDER — HYDROXYZINE HCL 25 MG PO TABS
25.0000 mg | ORAL_TABLET | Freq: Three times a day (TID) | ORAL | Status: DC | PRN
  Administered 2023-08-29: 25 mg via ORAL
  Filled 2023-08-29: qty 1

## 2023-08-29 MED ORDER — CEPHALEXIN 500 MG PO CAPS: 500.0000 mg | ORAL_CAPSULE | Freq: Four times a day (QID) | ORAL | Status: AC

## 2023-08-29 NOTE — Progress Notes (Signed)
Attempted to call report with no avail left a message for the director

## 2023-08-29 NOTE — Discharge Summary (Signed)
Name: Mallory Estrada MRN: [id number] DOB: Jan 21, 1941 82 y.o. PCP: Joaquim Nam, MD  Date of Admission: 08/26/2023 11:18 AM Date of Discharge: 08/29/2023 1:16 PM Attending Physician: Dr.  Johny Sax , MD  Discharge Diagnosis: Principal Problem:   Cellulitis of right lower extremity Active Problems:   Gait abnormality   Mild dementia, unclear etiology   Recurrent falls   Current use of long term anticoagulation    Discharge Medications: Allergies as of 08/29/2023       Reactions   Fentanyl    Nausea/vomiting.  Can tolerate dilaudid   Gabapentin    vertigo   Ketamine    intolerant   Macrobid [nitrofurantoin]    Memantine    Dizzy/falls.     Sulfa Antibiotics    swelling   Trazodone And Nefazodone    Intolerant but not allergic   Anesthetics, Amide Nausea Only        Medication List     STOP taking these medications    ciprofloxacin 250 MG tablet Commonly known as: CIPRO   divalproex 125 MG DR tablet Commonly known as: Depakote       TAKE these medications    acetaminophen 500 MG tablet Commonly known as: TYLENOL Take 500-1,000 mg by mouth every 6 (six) hours as needed for moderate pain (pain score 4-6).   apixaban 5 MG Tabs tablet Commonly known as: ELIQUIS Take 1 tablet (5 mg total) by mouth 2 (two) times daily.   Aspercreme Lidocaine 4 % Generic drug: lidocaine Place 1 patch onto the skin daily as needed (Pain -  right arm/shoulder).   atorvastatin 10 MG tablet Commonly known as: LIPITOR TAKE 1 TABLET DAILY   benzocaine 20 % Gel Commonly known as: HURRICAINE Use as directed 1 Application in the mouth or throat 3 (three) times daily as needed (Gum/Denture Pain).   cephALEXin 500 MG capsule Commonly known as: KEFLEX Take 1 capsule (500 mg total) by mouth every 6 (six) hours for 2 days.   cyclobenzaprine 5 MG tablet Commonly known as: FLEXERIL Take 5 mg by mouth 3 (three) times daily as needed for muscle spasms.    diclofenac Sodium 1 % Gel Commonly known as: VOLTAREN Apply 4 g topically 4 (four) times daily. To R hip and lower leg   docusate sodium 100 MG capsule Commonly known as: COLACE Take 100 mg by mouth daily.   Estring 2 MG vaginal ring Generic drug: estradiol Place 2 mg vaginally every 3 (three) months. follow package directions   furosemide 20 MG tablet Commonly known as: LASIX TAKE 1 TABLET DAILY EXCEPT SKIP SUNDAYS.  TAKE MONDAY THROUGH SATURDAY   ketorolac 10 MG tablet Commonly known as: TORADOL Take 1 tablet (10 mg total) by mouth every 6 (six) hours as needed for up to 5 days.   Klor-Con M20 20 MEQ tablet Generic drug: potassium chloride SA TAKE 1 TABLET DAILY   levothyroxine 50 MCG tablet Commonly known as: SYNTHROID TAKE 1 TABLET 30 MINUTES BEFORE BREAKFAST. NEW PRESCRIPTION REQUEST   loratadine 10 MG tablet Commonly known as: CLARITIN Take 1 tablet (10 mg total) by mouth daily.   magnesium gluconate 500 MG tablet Commonly known as: MAGONATE Take 1 tablet (500 mg total) by mouth every evening.   MELATONIN PO Take 3 mg by mouth at bedtime.   PETROLEUM JELLY EX Apply 1 Application topically 2 (two) times daily as needed (Leg creases to prevent chafing).   polyethylene glycol powder 17 GM/SCOOP powder Commonly known as:  GLYCOLAX/MIRALAX Take 17 g by mouth daily as needed (for constipation).   Preparation H 0.25-14-74.9 % rectal ointment Generic drug: phenylephrine-shark liver oil-mineral oil-petrolatum Place 1 Application rectally every 6 (six) hours as needed for hemorrhoids.   progesterone 100 MG capsule Commonly known as: Prometrium 1 capsule by mouth daily for the 10 days following Estring replacement every 3 months   psyllium 0.52 g capsule Commonly known as: REGULOID Take 0.52 g by mouth at bedtime.   REFRESH TEARS OP Place 1 drop into both eyes daily as needed (Floaters).   senna-docusate 8.6-50 MG tablet Commonly known as: Senna S Take 2  tablets by mouth daily.   sertraline 25 MG tablet Commonly known as: ZOLOFT Take 1 tablet (25 mg total) by mouth every evening.   Vitamin D3 125 MCG (5000 UT) Caps Take 5,000 Units by mouth daily.        Disposition and follow-up:   Mallory Estrada was discharged from [facility] in Stable condition.  At the hospital follow up visit please address:  1.  Follow-up:  *Functional deconditioning *Mechanical fall on anticoagulation *Gait abnormalities -Physical therapy -Medication Management to minimize centrally acting medications -Ensure supervised mobility  *Mild RLE cellulitis -Finish course of cephalexin until 10/30 -Monitor for signs or symptoms of systemic infection  *Atrial fibrillation -Rate controlled without medications -Ensure DOAC at facility - daughter will obtain from prior assisted living facility  2.  Labs / imaging needed at time of follow-up: CBC, BMP  3.  Pending labs/ test needing follow-up: none  4.  Medication Changes  ADDED  - Cephalexin 500 mg q6 hours until 10/30  - Toradol 10 mg po as needed for pain  - Acetaminophen 1000 mg q8HR scheduled for 7 days. As needed there after  Follow-up Appointments: Ensure follow up with primary care physician in 1-2 weeks Ensure follow up with urogyunecology at discharge from facility to monitor genitourinary syndrome of menopause  Hospital Course by problem list:  Mechanical fall on anticoagulation Mild dementia of unknown etiology Gait abnormality with mobility impatent Functional deconditioning Patient with mild dementia and mobility impairment who sustained a mechanical fall at assisted living facility without loss of consciousness. Skeletal survey on imaging without osseous abnormalities on CT head, cervical spine or XR of R hip, femur or tibia/fibula. Mild posterior occipital hematoma without HA or neurological changes. Patient was managed  with supportive treatment  with short course  of opioids. Hemoglobin remained stable this admission; low clinical concern for pelvis bleed. Patient was also evaluated by physical therapy, who recommended subacute rehabilitation at a skilled nursing facility. Because of advanced age, patient was counseled on the effects of muscle relaxants and centrally acting medications.   Mild Uncomplicated non purulent soft tissue infection RLE edema  Patient presented with after a mechanical fall and found to have a mild RLE posterior hematoma with overlying tissue erythema with non-demarcated borders concerning for cellulitis.  The patient however remained afebrile and had no leucocytosis.Patient was given one dose of CXT and transitioned to oral cephalexin to be taking until 10/30. Blood cultures without grow to date . Patients RLE erythema and ecchymosis improved significantly during this admission.   Hx of atrial fibrillation Initially diagnosed after orthopedic surgery. NSR on admission. Previously on Coumadin, on DOAC since May of 2024. Chadvasc score 5, embolic infarct risk of 10%. Hasbled score of 3,5.8% risk of major bleeding/ 3.72 bleeds per 100 patient-years.  After discussions with daughter, she will continue on DOAC which  she will bring to the SNF at discharge  Hypertension Diastolic HF Mildly hypertensive on admission likely secondary to pain. No signs of acute on chronic HF. No recent TTE on file. Will resume her medications at discharge   Recurrent UTI Vaginal atrophy No urinary symptoms today. Patient is managed by urogynecology outpatient. Will need to follow up with specialist for Estrogen ring removal and reinsertion in the next few weeks Depression   Discharge Subjective: Patient is feeling much improved since her admission. Daughter at bedside chose SNF to continue subacute rehab. Patient with improving pain. No nausea, vomiting, shortness of bed. Understands that she is at high risk of delirium in hospital and wants to transition to  SNF.  Discharge Exam:   Blood pressure 123/86, pulse 66, temperature 98.1 F (36.7 C), temperature source Oral, resp. rate 20, height 5\' 4"  (1.626 m), weight 84.2 kg, SpO2 93%.  Constitutional:well-appearing elderly woman in NAD.  HENT: Mild tenderness to palpation on posterior head, improving.  mucous membranes moist Cardiovascular: regular rate and rhythm, no m/r/g Pulmonary/Chest: normal work of breathing on room air, lungs clear to auscultation bilaterally Abdominal: soft, non-tender, non-distended Neurological: alert & oriented x 3 MSK: Mild tenderness to palpation over right hip and RLE. Patient with ability to externally and internally rotate hip. Plantar and dorsi flex foot. Mild LE edema bilaterally Skin: Resolving ecchymosis over RLE from knee to dorsum of foot. Mild hematoma over R knee. Resolving erythema over lower anterior aspect of the RLE. No warmth, fluctuance, or purulence Psych: Normal mood and affect  Pertinent Labs, Studies, and Procedures:     Latest Ref Rng & Units 08/29/2023    6:51 AM 08/28/2023    4:27 AM 08/27/2023    4:09 AM  CBC  WBC 4.0 - 10.5 K/uL 5.3  5.1  4.7   Hemoglobin 12.0 - 15.0 g/dL 16.1  09.6  04.5   Hematocrit 36.0 - 46.0 % 35.8  36.0  34.8   Platelets 150 - 400 K/uL 164  159  163        Latest Ref Rng & Units 08/27/2023    4:09 AM 08/26/2023   11:51 AM 03/15/2023    4:11 PM  CMP  Glucose 70 - 99 mg/dL 409  811  99   BUN 8 - 23 mg/dL 18  28  40   Creatinine 0.44 - 1.00 mg/dL 9.14  7.82  9.56   Sodium 135 - 145 mmol/L 142  142  139   Potassium 3.5 - 5.1 mmol/L 3.5  4.0  4.3   Chloride 98 - 111 mmol/L 108  105  105   CO2 22 - 32 mmol/L 25   27   Calcium 8.9 - 10.3 mg/dL 9.2   9.7   Total Protein 6.0 - 8.3 g/dL   6.9   Total Bilirubin 0.2 - 1.2 mg/dL   0.4   Alkaline Phos 39 - 117 U/L   58   AST 0 - 37 U/L   18   ALT 0 - 35 U/L   11     DG Femur Min 2 Views Right  Result Date: 08/26/2023 CLINICAL DATA:  Pain after fall. EXAM:  RIGHT FEMUR 2 VIEWS COMPARISON:  Same day radiographs of the right hip at 12:16 p.m. FINDINGS: No acute fracture identified. Status post right total hip arthroplasty. The femoral and acetabular components appear well seated with stable alignment. Status post right total knee arthroplasty with intact hardware in appropriate alignment. IMPRESSION: 1. No  acute osseous abnormality. 2. Intact right total hip and total knee arthroplasties. Electronically Signed   By: Hart Robinsons M.D.   On: 08/26/2023 17:09   CT Cervical Spine Wo Contrast  Result Date: 08/26/2023 CLINICAL DATA:  82 year old female status post fall backwards, on Eliquis. Struck back of head. Pain. EXAM: CT CERVICAL SPINE WITHOUT CONTRAST TECHNIQUE: Multidetector CT imaging of the cervical spine was performed without intravenous contrast. Multiplanar CT image reconstructions were also generated. RADIATION DOSE REDUCTION: This exam was performed according to the departmental dose-optimization program which includes automated exposure control, adjustment of the mA and/or kV according to patient size and/or use of iterative reconstruction technique. COMPARISON:  Head CT today. FINDINGS: Alignment: Straightening of cervical lordosis. Mild degenerative appearing spondylolisthesis at C4-C5, C5-C6, C7-T1. Bilateral posterior element alignment is within normal limits. Skull base and vertebrae: Bone mineralization is within normal limits for age. Visualized skull base is intact. No atlanto-occipital dissociation. C1-C2 appear chronically degenerated but intact and aligned. No acute osseous abnormality identified. Soft tissues and spinal canal: No prevertebral fluid or swelling. No visible canal hematoma. Negative visible noncontrast neck soft tissues except for calcified cervical carotid atherosclerosis. Disc levels: Widespread cervical spine degeneration. But generally capacious CT appearance of the underlying spinal canal. Mild spinal stenosis is possible  at C5-C6 and C6-C7. Upper chest: Visible upper thoracic levels appear grossly intact. Mild apical lung scarring. Negative visible noncontrast thoracic inlet. IMPRESSION: 1. No acute traumatic injury identified in the cervical spine. 2. Chronic cervical spine degeneration. Electronically Signed   By: Odessa Fleming M.D.   On: 08/26/2023 12:53   DG Tibia/Fibula Right  Result Date: 08/26/2023 CLINICAL DATA:  82 year old female status post fall backwards, on Eliquis. Struck back of head. Pain. EXAM: RIGHT TIBIA AND FIBULA - 2 VIEW COMPARISON:  None Available. FINDINGS: Right total knee arthroplasty. Hardware appears intact and aligned. Postoperative changes to the undersurface of the patella. No joint effusion on the cross-table lateral view. Underlying bone mineralization within normal limits for age. Maintained alignment at the right ankle. Right tibia and fibula appear intact. Calcaneus appears intact with mild degenerative spurring. Generalized lower extremity subcutaneous swelling, stranding. IMPRESSION: Generalized lower extremity soft tissue swelling. No acute fracture or dislocation identified about the right tib-fib. Right total knee arthroplasty with no adverse features. Electronically Signed   By: Odessa Fleming M.D.   On: 08/26/2023 12:50   DG Hip Unilat W or Wo Pelvis 2-3 Views Right  Result Date: 08/26/2023 CLINICAL DATA:  82 year old female status post fall backwards, on Eliquis. Struck back of head. Pain. EXAM: DG HIP (WITH OR WITHOUT PELVIS) 2-3V RIGHT COMPARISON:  None Available. FINDINGS: Bilateral total hip arthroplasty and partially visible lumbar spine fusion hardware. Left femoral component incompletely visible. Normal AP alignment of both hips. No pelvis fracture identified. Proximal left femur appears grossly intact. Right hip hardware completely visible, intact and aligned. Proximal right femur appears intact. Negative visible bowel gas pattern, retained stool. IMPRESSION: Bilateral total hip  arthroplasty. No acute fracture or dislocation identified about the pelvis or right hip. Electronically Signed   By: Odessa Fleming M.D.   On: 08/26/2023 12:48   CT Head Wo Contrast  Result Date: 08/26/2023 CLINICAL DATA:  82 year old female status post fall backwards, on Eliquis. Struck back of head. Pain. EXAM: CT HEAD WITHOUT CONTRAST TECHNIQUE: Contiguous axial images were obtained from the base of the skull through the vertex without intravenous contrast. RADIATION DOSE REDUCTION: This exam was performed according to the departmental dose-optimization program  which includes automated exposure control, adjustment of the mA and/or kV according to patient size and/or use of iterative reconstruction technique. COMPARISON:  Brain MRI 02/08/2022.  Head CT 10/13/2022. FINDINGS: Brain: Stable cerebral volume. No midline shift, mass effect, or evidence of intracranial mass lesion. No acute ventriculomegaly. Normal basilar cisterns. No acute intracranial hemorrhage identified. No cortically based acute infarct identified. Gray-white differentiation stable and within normal limits for age. Vascular: No suspicious intracranial vascular hyperdensity. Calcified atherosclerosis at the skull base. Skull: Stable.  No acute osseous abnormality identified. Sinuses/Orbits: Visualized paranasal sinuses and mastoids are stable and well aerated. Other: Broad-based posterior scalp hematoma tracking toward the vertex, up to 6 mm in thickness. Underlying calvarium appears stable and intact. No scalp soft tissue gas identified. Visualized orbit soft tissues are within normal limits. IMPRESSION: Posterior scalp hematoma. No skull fracture or acute intracranial abnormality. Electronically Signed   By: Odessa Fleming M.D.   On: 08/26/2023 12:46     Discharge Instructions: Discharge Instructions     Call MD for:  difficulty breathing, headache or visual disturbances   Complete by: As directed    Call MD for:  extreme fatigue   Complete by: As  directed    Call MD for:  persistant dizziness or light-headedness   Complete by: As directed    Call MD for:  persistant nausea and vomiting   Complete by: As directed    Call MD for:  redness, tenderness, or signs of infection (pain, swelling, redness, odor or green/yellow discharge around incision site)   Complete by: As directed    Call MD for:  severe uncontrolled pain   Complete by: As directed    Call MD for:  temperature >100.4   Complete by: As directed    Diet - low sodium heart healthy   Complete by: As directed    Discharge instructions   Complete by: As directed    Dear Mallory Estrada, It was a pleasure taking care of you at [facility]. You were admitted for a fall  and treated for mils soft tissue infection and edema of your right lower extremity. We are discharging you home now that you are doing better. Please follow the following instructions.  1)Difficulty with walking  and overall weakness: our physical therapy team identified that you would benefit from strngthening and additional assistance for which you are being discharged to a skilled nursing facility. 2) For your Right lower leg infection and swelling  - Continue taking your antibiotic ( Keflex ) by mouth until 08/31/2023  - Keep the infected area clean and dry - You can take a shower or bath, but be sure to pat the area dry with a towel afterward.  - Raise your  leg (if your infection is on your arm or leg) to reduce swelling - Raise the arm or leg up above the level of your heart 3 or 4 times a day, for 30 minutes each time.  -If you cut your skin, make sure to wash the area well with soap and water. This can help prevent the area from getting infected. If you have a long-term skin condition, ask your doctor or nurse what you can do to help prevent infections. 2) For your Hypertension - Please continue your home medication, 3) For your atrial fibrillation  - Please continue your home Eliquis   Take care,   Dr. Kathleen Lime, MD   Increase activity slowly   Complete by: As directed  Signed: Morene Crocker, MD [facility] - PGY2 Pager: [phone] 08/29/2023, 1:16 PM    Please contact the on call pager after 5 pm and on weekends at [phone].

## 2023-08-29 NOTE — Discharge Instructions (Addendum)
Dear Ms Chuba, It was a pleasure taking care of you at [facility]. You were admitted for a fall  and treated for mils soft tissue infection and edema of your right lower extremity. We are discharging you home now that you are doing better. Please follow the following instructions.  1)Difficulty with walking  and overall weakness: our physical therapy team identified that you would benefit from strngthening and additional assistance for which you are being discharged to a skilled nursing facility. 2) For your Right lower leg infection and swelling  - Continue taking your antibiotic ( Keflex ) by mouth until 08/31/2023  - Keep the infected area clean and dry - You can take a shower or bath, but be sure to pat the area dry with a towel afterward.  - Raise your  leg (if your infection is on your arm or leg) to reduce swelling - Raise the arm or leg up above the level of your heart 3 or 4 times a day, for 30 minutes each time.  -If you cut your skin, make sure to wash the area well with soap and water. This can help prevent the area from getting infected. If you have a long-term skin condition, ask your doctor or nurse what you can do to help prevent infections. 2) For your Hypertension - Please continue your home medication, 3) For your atrial fibrillation  - Please continue your home Eliquis   Take care,  Dr. Kathleen Lime, MD

## 2023-08-29 NOTE — Progress Notes (Signed)
   HD#0 SUBJECTIVE:  Patient Summary: Mallory Estrada is a 82 y.o. with a pertinent PMH of Afib on Eliquis ,Dementia,Multiple Falls, who presented after a fall and admitted for cellulitis of right   lower extremity.  Overnight Events: Patient got Toradol 15 mg for pain overnight and also had Hydroxyzine 25 mg for anxiety  Interim History:  Patient said she feels " wonderful" when asked how she is doing.D[facility] at bedside [facility] but still open to other places at this time.    OBJECTIVE:  Vital Signs: Vitals:   08/28/23 1500 08/28/23 1934 08/29/23 0327 08/29/23 0328  BP: (!) 142/56 (!) 130/59 (!) 144/65   Pulse: 61 66 66   Resp: 18 16 16    Temp: 97.6 F (36.4 C) 98.3 F (36.8 C) 97.9 F (36.6 C)   TempSrc: Oral Oral Oral   SpO2:  94%  92%  Weight:    84.2 kg  Height:       Supplemental O2: Room Air SpO2: 92 %  Filed Weights   08/26/23 1140 08/29/23 0328  Weight: 83.9 kg 84.2 kg     Intake/Output Summary (Last 24 hours) at 08/29/2023 0636 Last data filed at 08/28/2023 2200 Gross per 24 hour  Intake 240 ml  Output 100 ml  Net 140 ml   Net IO Since Admission: 180 mL [08/29/23 0636]  Physical Exam: Physical Exam Constitutional:      General: She is not in acute distress.    Appearance: Normal appearance. She is not ill-appearing, toxic-appearing or diaphoretic.  Cardiovascular:     Rate and Rhythm: Normal rate.     Pulses: Normal pulses.  Musculoskeletal:        General: No deformity.     Comments: LE cellulitis and hematoma site improved   Neurological:     Mental Status: She is alert.     Patient Lines/Drains/Airways Status     Active Line/Drains/Airways     Name Placement date Placement time Site Days   Peripheral IV 08/26/23 22 G 1.75" Anterior;Left Forearm 08/26/23  1714  Forearm  1   External Urinary Catheter 08/26/23  1227  --  1             ASSESSMENT/PLAN:  Assessment: Principal Problem:   Cellulitis of  right lower extremity Active Problems:   Gait abnormality   Mild dementia, unclear etiology   Recurrent falls   Current use of long term anticoagulation   Plan: #Cellulitis of right lower Extremity.  Cellulitis site significantly improved compared to yesterday. - Continue Keflex 500mg  every 6 till 10/30  #Recurrent Falls 2/2 to gait abnormality  PT/OT saw patient and recommended SNF.Optimizing pain control and fall precautions   Chronic Conditions #History of Atrial Fibrillation History of Afib on Eliquis - Continue Eliquis 5 mg BID - Continue cardiac monitoring   #Hx of Diastolic Heart Failure #Hx Of Hypertension - Continue to monitor   DISCHARGE PLANNING : Daughter opted for [facility] . CSW is working on Publishing rights manager: Diet: Heart Healthy VTE: apixaban (ELIQUIS) tablet 5 mg Start: 08/27/23 1200 SCDs Start: 08/26/23 1756 Code: DNR AB: Cephalexin 500 mg BID  DISPO: Anticipated discharge in 1 days to SNF pending SNF placement   Signature: Kathleen Lime , MD Internal Medicine Resident, PGY-1 [facility]  Pager: [phone] 6:36 AM, 08/29/2023   Please contact the on call pager after 5 pm and on weekends at [phone].

## 2023-08-29 NOTE — Progress Notes (Signed)
Provider paged at [facility] regarding pain. Patient has had flexeril and oxy with no relief and is stating she has intermittent sharp pains in her R inner thigh. She is also anxious and can't sleep due to the pain. Dr. Carron Brazen to come evaluate patient and placed an order for atarax for her anxiety.

## 2023-08-29 NOTE — TOC Progression Note (Addendum)
Transition of Care [facility]) - Progression Note    Patient Details  Name: Mallory Estrada MRN: [id number] Date of Birth: 25-May-1941  Transition of Care [facility]) CM/SW Contact  Delilah Shan, LCSWA Phone Number: 08/29/2023, 10:21 AM  Clinical Narrative:     CSW spoke with patients insurance who informed CSW that patients insurance authorization has been approved for [facility]. Insurance authorization approved from 10/28-10/30. WUJ#[id number]. CSW will continue to follow and assist with patients dc planning needs.  Update- CSW spoke with patients daughter who request to see if [facility] can offer SNF bed for patient. CSW spoke with Britney with [facility] who confirmed SNF bed for patient. CSW informed patient daughter. Patients daughter confirmed patient accepted SNF bed offer with [facility]. CSW called patients insurance auhtorizaton and switched facility to [place] . Patients insurance authorization confirmed patient has been approved for [facility] SNF from 10/28-10/30 Reference # [id number]. CSW informed MD.       Expected Discharge Plan and Services                                               Social Determinants of Health (SDOH) Interventions SDOH Screenings   Food Insecurity: No Food Insecurity (08/27/2023)  Housing: Low Risk  (08/27/2023)  Transportation Needs: No Transportation Needs (08/27/2023)  Utilities: Not At Risk (08/27/2023)  Alcohol Screen: Low Risk  (11/25/2022)  Depression (PHQ2-9): Low Risk  (11/25/2022)  Financial Resource Strain: Low Risk  (03/14/2023)  Physical Activity: Inactive (03/14/2023)  Social Connections: Moderately Integrated (03/14/2023)  Stress: Patient Declined (03/14/2023)  Tobacco Use: Medium Risk (08/02/2023)    Readmission Risk Interventions     No data to display

## 2023-08-29 NOTE — TOC Transition Note (Signed)
Transition of Care [facility]) - CM/SW Discharge Note   Patient Details  Name: Mallory Estrada MRN: [id number] Date of Birth: 04-14-1941  Transition of Care [facility]) CM/SW Contact:  Mallory Estrada, LCSWA Phone Number: 08/29/2023, 1:39 PM   Clinical Narrative:      Patient will DC to: [facility]  Anticipated DC date: 08/29/2023   Family notified:  Investment banker, corporate by: Mallory Estrada  ?  Per MD patient ready for DC to [facility] SN[facility] . RN, patient, patient's family, and facility notified of DC. Discharge Summary sent to facility. RN given number for report tele# [phone] RM# [phone]. DC packet on chart.DNR signed by MD attached to patients DC packet. Ambulance transport requested for patient.  CSW signing off.  Final next level of care: Skilled Nursing Facility Barriers to Discharge: No Barriers Identified   Patient Goals and CMS Choice   Choice offered to / list presented to : Adult Children (patients daughter Mallory Estrada)  Discharge Placement                Patient chooses bed at: [facility] Patient to be transferred to facility by: [facility] Name of family member notified: Mallory Estrada Patient and family notified of of transfer: 08/29/23  Discharge Plan and Services Additional resources added to the After Visit Summary for                                       Social Determinants of Health (SDOH) Interventions SDOH Screenings   Food Insecurity: No Food Insecurity (08/27/2023)  Housing: Low Risk  (08/27/2023)  Transportation Needs: No Transportation Needs (08/27/2023)  Utilities: Not At Risk (08/27/2023)  Alcohol Screen: Low Risk  (11/25/2022)  Depression (PHQ2-9): Low Risk  (11/25/2022)  Financial Resource Strain: Low Risk  (03/14/2023)  Physical Activity: Inactive (03/14/2023)  Social Connections: Moderately Integrated (03/14/2023)  Stress: Patient Declined (03/14/2023)  Tobacco Use: Medium Risk (08/29/2023)     Readmission Risk Interventions     No data to display

## 2023-08-31 LAB — CULTURE, BLOOD (ROUTINE X 2)
Culture: NO GROWTH
Culture: NO GROWTH
Special Requests: ADEQUATE
Special Requests: ADEQUATE

## 2023-09-01 DIAGNOSIS — E785 Hyperlipidemia, unspecified: Secondary | ICD-10-CM | POA: Diagnosis not present

## 2023-09-01 DIAGNOSIS — F0390 Unspecified dementia without behavioral disturbance: Secondary | ICD-10-CM | POA: Diagnosis not present

## 2023-09-01 DIAGNOSIS — I10 Essential (primary) hypertension: Secondary | ICD-10-CM | POA: Diagnosis not present

## 2023-09-01 DIAGNOSIS — E039 Hypothyroidism, unspecified: Secondary | ICD-10-CM | POA: Diagnosis not present

## 2023-09-07 DIAGNOSIS — F0390 Unspecified dementia without behavioral disturbance: Secondary | ICD-10-CM

## 2023-09-07 DIAGNOSIS — R296 Repeated falls: Secondary | ICD-10-CM

## 2023-09-07 DIAGNOSIS — L03115 Cellulitis of right lower limb: Secondary | ICD-10-CM | POA: Diagnosis not present

## 2023-09-07 DIAGNOSIS — E785 Hyperlipidemia, unspecified: Secondary | ICD-10-CM

## 2023-09-07 DIAGNOSIS — I10 Essential (primary) hypertension: Secondary | ICD-10-CM | POA: Diagnosis not present

## 2023-09-07 DIAGNOSIS — M79604 Pain in right leg: Secondary | ICD-10-CM | POA: Diagnosis not present

## 2023-09-07 DIAGNOSIS — E039 Hypothyroidism, unspecified: Secondary | ICD-10-CM | POA: Diagnosis not present

## 2023-09-07 DIAGNOSIS — I4891 Unspecified atrial fibrillation: Secondary | ICD-10-CM | POA: Diagnosis not present

## 2023-09-11 ENCOUNTER — Emergency Department (HOSPITAL_COMMUNITY): Payer: Medicare Other

## 2023-09-11 ENCOUNTER — Encounter (HOSPITAL_COMMUNITY): Payer: Self-pay | Admitting: Emergency Medicine

## 2023-09-11 ENCOUNTER — Other Ambulatory Visit: Payer: Self-pay

## 2023-09-11 ENCOUNTER — Emergency Department (HOSPITAL_COMMUNITY)
Admission: EM | Admit: 2023-09-11 | Discharge: 2023-09-11 | Disposition: A | Discharge status: Nursing Home (Includes ICF) | Payer: Medicare Other | Attending: Emergency Medicine | Admitting: Emergency Medicine

## 2023-09-11 DIAGNOSIS — M8448XA Pathological fracture, other site, initial encounter for fracture: Secondary | ICD-10-CM | POA: Diagnosis not present

## 2023-09-11 DIAGNOSIS — R404 Transient alteration of awareness: Secondary | ICD-10-CM | POA: Diagnosis not present

## 2023-09-11 DIAGNOSIS — R519 Headache, unspecified: Secondary | ICD-10-CM | POA: Diagnosis not present

## 2023-09-11 DIAGNOSIS — R0989 Other specified symptoms and signs involving the circulatory and respiratory systems: Secondary | ICD-10-CM | POA: Diagnosis not present

## 2023-09-11 DIAGNOSIS — R140 Abdominal distension (gaseous): Secondary | ICD-10-CM | POA: Diagnosis not present

## 2023-09-11 DIAGNOSIS — M542 Cervicalgia: Secondary | ICD-10-CM | POA: Diagnosis not present

## 2023-09-11 DIAGNOSIS — Z79899 Other long term (current) drug therapy: Secondary | ICD-10-CM | POA: Insufficient documentation

## 2023-09-11 DIAGNOSIS — R220 Localized swelling, mass and lump, head: Secondary | ICD-10-CM | POA: Diagnosis not present

## 2023-09-11 DIAGNOSIS — I10 Essential (primary) hypertension: Secondary | ICD-10-CM | POA: Diagnosis not present

## 2023-09-11 DIAGNOSIS — Z7901 Long term (current) use of anticoagulants: Secondary | ICD-10-CM | POA: Diagnosis not present

## 2023-09-11 DIAGNOSIS — F0390 Unspecified dementia without behavioral disturbance: Secondary | ICD-10-CM | POA: Diagnosis not present

## 2023-09-11 DIAGNOSIS — I4891 Unspecified atrial fibrillation: Secondary | ICD-10-CM | POA: Insufficient documentation

## 2023-09-11 DIAGNOSIS — W19XXXA Unspecified fall, initial encounter: Secondary | ICD-10-CM | POA: Diagnosis not present

## 2023-09-11 DIAGNOSIS — M4802 Spinal stenosis, cervical region: Secondary | ICD-10-CM | POA: Diagnosis not present

## 2023-09-11 DIAGNOSIS — M4312 Spondylolisthesis, cervical region: Secondary | ICD-10-CM | POA: Diagnosis not present

## 2023-09-11 DIAGNOSIS — R079 Chest pain, unspecified: Secondary | ICD-10-CM | POA: Diagnosis not present

## 2023-09-11 DIAGNOSIS — Z7401 Bed confinement status: Secondary | ICD-10-CM | POA: Diagnosis not present

## 2023-09-11 DIAGNOSIS — M1909 Primary osteoarthritis, other specified site: Secondary | ICD-10-CM | POA: Diagnosis not present

## 2023-09-11 DIAGNOSIS — R0789 Other chest pain: Secondary | ICD-10-CM | POA: Diagnosis not present

## 2023-09-11 DIAGNOSIS — R531 Weakness: Secondary | ICD-10-CM | POA: Diagnosis not present

## 2023-09-11 DIAGNOSIS — R4182 Altered mental status, unspecified: Secondary | ICD-10-CM | POA: Diagnosis not present

## 2023-09-11 LAB — CBC WITH DIFFERENTIAL/PLATELET
Abs Immature Granulocytes: 0.03 10*3/uL (ref 0.00–0.07)
Basophils Absolute: 0 10*3/uL (ref 0.0–0.1)
Basophils Relative: 1 %
Eosinophils Absolute: 0.2 10*3/uL (ref 0.0–0.5)
Eosinophils Relative: 3 %
HCT: 41.6 % (ref 36.0–46.0)
Hemoglobin: 13.6 g/dL (ref 12.0–15.0)
Immature Granulocytes: 1 %
Lymphocytes Relative: 16 %
Lymphs Abs: 1 10*3/uL (ref 0.7–4.0)
MCH: 30.6 pg (ref 26.0–34.0)
MCHC: 32.7 g/dL (ref 30.0–36.0)
MCV: 93.5 fL (ref 80.0–100.0)
Monocytes Absolute: 0.5 10*3/uL (ref 0.1–1.0)
Monocytes Relative: 8 %
Neutro Abs: 4.7 10*3/uL (ref 1.7–7.7)
Neutrophils Relative %: 71 %
Platelets: 250 10*3/uL (ref 150–400)
RBC: 4.45 MIL/uL (ref 3.87–5.11)
RDW: 13.2 % (ref 11.5–15.5)
WBC: 6.4 10*3/uL (ref 4.0–10.5)
nRBC: 0 % (ref 0.0–0.2)

## 2023-09-11 LAB — URINALYSIS, ROUTINE W REFLEX MICROSCOPIC
Bilirubin Urine: NEGATIVE
Bilirubin Urine: NEGATIVE
Glucose, UA: NEGATIVE mg/dL
Glucose, UA: NEGATIVE mg/dL
Hgb urine dipstick: NEGATIVE
Ketones, ur: 5 mg/dL — AB
Ketones, ur: 5 mg/dL — AB
Nitrite: NEGATIVE
Nitrite: NEGATIVE
Protein, ur: NEGATIVE mg/dL
Protein, ur: NEGATIVE mg/dL
Specific Gravity, Urine: 1.019 (ref 1.005–1.030)
Specific Gravity, Urine: 1.02 (ref 1.005–1.030)
WBC, UA: >50 WBC/hpf (ref 0–5)
pH: 5 (ref 5.0–8.0)
pH: 6 (ref 5.0–8.0)

## 2023-09-11 LAB — COMPREHENSIVE METABOLIC PANEL
ALT: 12 U/L (ref 0–44)
AST: 20 U/L (ref 15–41)
Albumin: 3.6 g/dL (ref 3.5–5.0)
Alkaline Phosphatase: 72 U/L (ref 38–126)
Anion gap: 9 (ref 5–15)
BUN: 22 mg/dL (ref 8–23)
CO2: 26 mmol/L (ref 22–32)
Calcium: 9.6 mg/dL (ref 8.9–10.3)
Chloride: 108 mmol/L (ref 98–111)
Creatinine, Ser: 0.95 mg/dL (ref 0.44–1.00)
GFR, Estimated: >60 mL/min (ref >60)
Glucose, Bld: 116 mg/dL — ABNORMAL HIGH (ref 70–99)
Potassium: 4 mmol/L (ref 3.5–5.1)
Sodium: 143 mmol/L (ref 135–145)
Total Bilirubin: 0.7 mg/dL (ref <1.2)
Total Protein: 6.6 g/dL (ref 6.5–8.1)

## 2023-09-11 MED ORDER — CEPHALEXIN 500 MG PO CAPS: 500.0000 mg | ORAL_CAPSULE | Freq: Two times a day (BID) | ORAL | 0 refills | Status: DC

## 2023-09-11 MED ORDER — CEPHALEXIN 250 MG PO CAPS
500.0000 mg | ORAL_CAPSULE | Freq: Once | ORAL | Status: AC
  Administered 2023-09-11: 500 mg via ORAL
  Filled 2023-09-11: qty 2

## 2023-09-11 NOTE — ED Provider Notes (Signed)
[place] EMERGENCY DEPARTMENT AT [facility] Provider Note   CSN: [id number] Arrival date & time: 09/11/23  4098     History  Chief Complaint  Patient presents with   Marletta Lor    Mallory Estrada is a 82 y.o. female with a past medical history of mild dementia, A-fib (on Eliquis) presents to emergency department via EMS for evaluation of unwitnessed fall from [employer] living.  She reports that she was coming out of the closet when her sock got caught on floor.  She denies complaints prior to the fall.  She denies LOC and recalls entire fall stating that she "was calling for help while falling down and after the fall for help".  She denies head, neck pain, visual disturbances, weakness, urinary symptoms, CP, nor SOB.   Fall Pertinent negatives include no chest pain, no abdominal pain, no headaches and no shortness of breath.       Home Medications Prior to Admission medications   Medication Sig Start Date End Date Taking? Authorizing Provider  cephALEXin (KEFLEX) 500 MG capsule Take 1 capsule (500 mg total) by mouth 2 (two) times daily. 09/11/23  Yes Judithann Sheen, PA  acetaminophen (TYLENOL) 500 MG tablet Take 500-1,000 mg by mouth every 6 (six) hours as needed for moderate pain (pain score 4-6).    [provider]  apixaban (ELIQUIS) 5 MG TABS tablet Take 1 tablet (5 mg total) by mouth 2 (two) times daily. 04/10/23   Joaquim Nam, MD  atorvastatin (LIPITOR) 10 MG tablet TAKE 1 TABLET DAILY 06/24/22   Joaquim Nam, MD  benzocaine (HURRICAINE) 20 % GEL Use as directed 1 Application in the mouth or throat 3 (three) times daily as needed (Gum/Denture Pain).    [provider]  Carboxymethylcellulose Sodium (REFRESH TEARS OP) Place 1 drop into both eyes daily as needed (Floaters).    [provider]  Cholecalciferol (VITAMIN D3) 125 MCG (5000 UT) CAPS Take 5,000 Units by mouth daily.    [provider]  cyclobenzaprine  (FLEXERIL) 5 MG tablet Take 5 mg by mouth 3 (three) times daily as needed for muscle spasms.    [provider]  diclofenac Sodium (VOLTAREN) 1 % GEL Apply 4 g topically 4 (four) times daily. To R hip and lower leg 08/29/23   Morene Crocker, MD  docusate sodium (COLACE) 100 MG capsule Take 100 mg by mouth daily.    [provider]  estradiol (ESTRING) 7.5 MCG/24HR vaginal ring Place 2 mg vaginally every 3 (three) months. follow package directions 05/24/23   Selmer Dominion, NP  furosemide (LASIX) 20 MG tablet TAKE 1 TABLET DAILY EXCEPT SKIP SUNDAYS.  TAKE MONDAY THROUGH SATURDAY 04/10/23   Joaquim Nam, MD  KLOR-CON M20 20 MEQ tablet TAKE 1 TABLET DAILY 04/13/23   Joaquim Nam, MD  levothyroxine (SYNTHROID) 50 MCG tablet TAKE 1 TABLET 30 MINUTES BEFORE BREAKFAST. NEW PRESCRIPTION REQUEST 08/04/22   Joaquim Nam, MD  lidocaine (ASPERCREME LIDOCAINE) 4 % Place 1 patch onto the skin daily as needed (Pain -  right arm/shoulder).    [provider]  loratadine (CLARITIN) 10 MG tablet Take 1 tablet (10 mg total) by mouth daily. 05/31/22   Joaquim Nam, MD  magnesium gluconate (MAGONATE) 500 MG tablet Take 1 tablet (500 mg total) by mouth every evening. 04/10/23   Joaquim Nam, MD  MELATONIN PO Take 3 mg by mouth at bedtime.    [provider]  Petrolatum (PETROLEUM JELLY EX) Apply 1 Application topically 2 (two) times daily as needed (Leg creases to prevent chafing).    [provider]  phenylephrine-shark liver oil-mineral oil-petrolatum (PREPARATION H) 0.25-14-74.9 % rectal ointment Place 1 Application rectally every 6 (six) hours as needed for hemorrhoids.    [provider]  polyethylene glycol powder (GLYCOLAX/MIRALAX) 17 GM/SCOOP powder Take 17 g by mouth daily as needed (for constipation). 12/18/21   Joaquim Nam, MD  progesterone (PROMETRIUM) 100 MG capsule 1 capsule by mouth daily for the 10 days following Estring  replacement every 3 months 04/10/23   Joaquim Nam, MD  psyllium (REGULOID) 0.52 g capsule Take 0.52 g by mouth at bedtime.    [provider]  senna-docusate (SENNA S) 8.6-50 MG tablet Take 2 tablets by mouth daily. 04/10/23   Joaquim Nam, MD  sertraline (ZOLOFT) 25 MG tablet Take 1 tablet (25 mg total) by mouth every evening. 04/10/23   Joaquim Nam, MD      Allergies    Fentanyl; Gabapentin; Ketamine; Macrobid [nitrofurantoin]; Memantine; Sulfa antibiotics; Trazodone and nefazodone; and Anesthetics, amide    Review of Systems   Review of Systems  Constitutional:  Negative for chills, fatigue and fever.  Respiratory:  Negative for cough, chest tightness, shortness of breath and wheezing.   Cardiovascular:  Negative for chest pain and palpitations.  Gastrointestinal:  Negative for abdominal pain, constipation, diarrhea, nausea and vomiting.  Neurological:  Negative for dizziness, seizures, weakness, light-headedness, numbness and headaches.    Physical Exam Updated Vital Signs BP (!) 125/57   Pulse 64   Temp 97.8 F (36.6 C) (Oral)   Resp 13   Ht 5\' 4"  (1.626 m)   Wt 83.9 kg   SpO2 99%   BMI 31.76 kg/m  Physical Exam Vitals and nursing note reviewed.  Constitutional:      General: She is not in acute distress.    Appearance: Normal appearance. She is not diaphoretic.  HENT:     Head: Normocephalic and atraumatic.     Comments: No battle sign No hematoma    Right Ear: External ear normal.     Left Ear: External ear normal.     Ears:     Comments: No hemotympanum    Nose: Nose normal.     Comments: No epistaxis nor dried blood in nostrils bilaterally    Mouth/Throat:     Comments: No damage to tongue Eyes:     General: No visual field deficit.       Right eye: No discharge.        Left eye: No discharge.     Extraocular Movements: Extraocular movements intact.     Conjunctiva/sclera: Conjunctivae normal.     Pupils: Pupils are equal, round, and  reactive to light.     Comments: No subconjunctival hemorrhage, hyphema, tear drop pupil, or fluid leakage bilaterally  Neck:     Vascular: No carotid bruit.     Comments: No crepitus, step-off, deformity, nor TTP noted to cervical neck No masses palpated Cardiovascular:     Rate and Rhythm: Normal rate.     Pulses: Normal pulses.     Heart sounds: Normal heart sounds.     Comments: Radial pulses 2+ equal bilaterally Dorsalis pedis 2+ equal bilaterally Pulmonary:     Effort: Pulmonary effort is normal. No respiratory distress.     Breath sounds: Normal breath sounds. No wheezing.  Chest:     Chest  wall: No tenderness.  Abdominal:     General: Bowel sounds are normal. There is no distension.     Palpations: Abdomen is soft.     Tenderness: There is no abdominal tenderness. There is no guarding.  Musculoskeletal:     Cervical back: Normal range of motion and neck supple. No rigidity or tenderness.     Comments: No crepitus, step-off, deformity, nor TTP to thoracic or lumbar spine No obvious deformity to joints or long bones Pelvis stable with no shortening or rotation of LE bilaterally  Skin:    General: Skin is warm and dry.     Capillary Refill: Capillary refill takes less than 2 seconds.  Neurological:     General: No focal deficit present.     Mental Status: She is alert and oriented to person, place, and time. Mental status is at baseline.     GCS: GCS eye subscore is 4. GCS verbal subscore is 5. GCS motor subscore is 6.     Cranial Nerves: Cranial nerves 2-12 are intact. No cranial nerve deficit or facial asymmetry.     Sensory: Sensation is intact. No sensory deficit.     Motor: Motor function is intact. No weakness, tremor or pronator drift.     Coordination: Coordination normal. Finger-Nose-Finger Test and Heel to [facility] Test normal. Rapid alternating movements normal.     Deep Tendon Reflexes: Reflexes are normal and symmetric.     Comments: Acting following commands  appropriately    ED Results / Procedures / Treatments   Labs (all labs ordered are listed, but only abnormal results are displayed) Labs Reviewed  COMPREHENSIVE METABOLIC PANEL - Abnormal; Notable for the following components:      Result Value   Glucose, Bld 116 (*)    All other components within normal limits  URINALYSIS, ROUTINE W REFLEX MICROSCOPIC - Abnormal; Notable for the following components:   Color, Urine AMBER (*)    APPearance CLOUDY (*)    Hgb urine dipstick SMALL (*)    Ketones, ur 5 (*)    Leukocytes,Ua MODERATE (*)    Bacteria, UA FEW (*)    All other components within normal limits  URINALYSIS, ROUTINE W REFLEX MICROSCOPIC - Abnormal; Notable for the following components:   Color, Urine AMBER (*)    APPearance CLOUDY (*)    Ketones, ur 5 (*)    Leukocytes,Ua MODERATE (*)    Bacteria, UA RARE (*)    All other components within normal limits  URINE CULTURE  URINE CULTURE  CBC WITH DIFFERENTIAL/PLATELET    EKG None  Radiology CT Cervical Spine Wo Contrast  Result Date: 09/11/2023 CLINICAL DATA:  82 year old female status post unwitnessed fall. Pain. EXAM: CT CERVICAL SPINE WITHOUT CONTRAST TECHNIQUE: Multidetector CT imaging of the cervical spine was performed without intravenous contrast. Multiplanar CT image reconstructions were also generated. RADIATION DOSE REDUCTION: This exam was performed according to the departmental dose-optimization program which includes automated exposure control, adjustment of the mA and/or kV according to patient size and/or use of iterative reconstruction technique. COMPARISON:  Head CT today.  Cervical spine CT 08/26/2023. FINDINGS: Alignment: Mildly improved cervical lordosis. Stable mild degenerative anterolisthesis C4 on C5 and C7 on T1. Bilateral posterior element alignment is within normal limits. Skull base and vertebrae: Bone mineralization is within normal limits for age. Visualized skull base is intact. No  atlanto-occipital dissociation. C1 and C2 appear intact and aligned. Chronic right TMJ degeneration. No acute osseous abnormality identified. Soft tissues and spinal  canal: No prevertebral fluid or swelling. No visible canal hematoma. Negative visible noncontrast neck soft tissues, calcified cervical carotid atherosclerosis. Disc levels: Stable cervical spine degeneration, mild chronic spinal stenosis suspected at C5 and C6. Upper chest: Negative. IMPRESSION: 1. No acute traumatic injury identified in the cervical spine. 2. Stable chronic cervical spine degeneration. Electronically Signed   By: Odessa Fleming M.D.   On: 09/11/2023 07:43   DG Chest Portable 1 View  Result Date: 09/11/2023 CLINICAL DATA:  82 year old female status post unwitnessed fall. Pain. EXAM: PORTABLE CHEST 1 VIEW COMPARISON:  Chest radiographs 06/08/2022. FINDINGS: Portable AP view at 0648 hours. Lower lung volumes. Moderate to large hiatal hernia is not apparent now. Calcified aortic atherosclerosis. Other mediastinal contours are within normal limits. Visualized tracheal air column is within normal limits. Allowing for portable technique the lungs are clear. No pneumothorax or pleural effusion. Osteopenia. Mach line artifact suspected at the medial right humerus head from the overlapping glenoid. Chronic right lateral 9th rib fracture appears stable. No acute osseous abnormality identified. Paucity of bowel gas. IMPRESSION: No acute cardiopulmonary abnormality or acute traumatic injury identified. Electronically Signed   By: Odessa Fleming M.D.   On: 09/11/2023 07:41   CT Head Wo Contrast  Result Date: 09/11/2023 CLINICAL DATA:  82 year old female status post unwitnessed fall. Pain. EXAM: CT HEAD WITHOUT CONTRAST TECHNIQUE: Contiguous axial images were obtained from the base of the skull through the vertex without intravenous contrast. RADIATION DOSE REDUCTION: This exam was performed according to the departmental dose-optimization program which  includes automated exposure control, adjustment of the mA and/or kV according to patient size and/or use of iterative reconstruction technique. COMPARISON:  Head CT 08/26/2023. FINDINGS: Brain: Stable cerebral volume. No midline shift, ventriculomegaly, mass effect, evidence of mass lesion, intracranial hemorrhage or evidence of cortically based acute infarction. Gray-white differentiation stable and within normal limits for age. Vascular: Calcified atherosclerosis at the skull base. No suspicious intracranial vascular hyperdensity. Skull: No fracture identified.  Chronic right TMJ degeneration. Sinuses/Orbits: Visualized paranasal sinuses and mastoids are stable and well aerated. Other: Resolved posterior scalp soft tissue injury since last month. No acute orbit or scalp soft tissue finding. IMPRESSION: 1. No acute traumatic injury identified. 2. Stable and negative for age non contrast CT appearance of the brain. Electronically Signed   By: Odessa Fleming M.D.   On: 09/11/2023 07:39    Procedures Procedures    Medications Ordered in ED Medications  cephALEXin (KEFLEX) capsule 500 mg (500 mg Oral Given 09/11/23 1213)    ED Course/ Medical Decision Making/ A&P                                 Medical Decision Making Amount and/or Complexity of Data Reviewed Labs: ordered. Radiology: ordered.  Risk Prescription drug management.   Patient presents to the ED for concern of unwitnessed fall, this involves an extensive number of treatment options, and is a complaint that carries with it a high risk of complications and morbidity.  The differential diagnosis includes fracture, dislocation, ICH.  This is not an exhaustive list   Co morbidities that complicate the patient evaluation  Dementia   Additional history obtained:  Additional history obtained from EMS, Family, Nursing, Outside Medical Records, and Past Admission   External records from outside source obtained and reviewed   Lab  Tests:  I Ordered, and personally interpreted labs.  The pertinent results include:  no leukocytosis,  electrolyte abnormality, anemia   Imaging Studies ordered:  I ordered imaging studies including CT head and cervical spine non con  I independently visualized and interpreted imaging which showed no acute intracranial abnormality I agree with the radiologist interpretation   Cardiac Monitoring:  The patient was maintained on a cardiac monitor.  I personally viewed and interpreted the cardiac monitored which showed an underlying rhythm of: NSR with 1 PVC.  There is no STE or ischemic changes noted    Problem List / ED Course:  Fall, initial encounter   Reevaluation:  After the interventions noted above, I reevaluated the patient and found that they have :stayed the same    Dispostion:  Patient is resting comfortably in bed.  Vital signs within her normal limits.  She is hemodynamically stable.  Fully alert and oriented x 3 at initial questioning. However, she has occasional confusion regarding time and place throughout ED visit.  No complaints of chest pain, shortness of breath, weakness.  She is speaking in full complete sentences in no acute signs of distress. see HPI.  Reassuring physical exam with no significant tenderness to cervical neck or spine, abdomen, pelvis.  Pelvis is stable with no obvious deformities, crepitus to joints or long bones.  She is neurologically intact with no sensation or motor deficit. Will obtain labs and CT as patient is on thinners and had unwitnessed fall.  However, she currently has no complaints of pain or injury  Labs are unremarkable with no leukocytosis or abnormalities requiring ED intervention.  UA was significant for contamination as was obtained through bedpan.  Will get a clean sample through [facility] as patient is incontinent and wears a diaper to rule out UTI.  She is currently denying urinary symptoms but due to age, unwitnessed fall,  intermittent confusion likely due to deme nita, and chronic incontinence will obtain UA to ensure it was not a cause of fall. CT negative for ICH or intracranial pathology.  Second UA appears to be contaminated as well however will provide Keflex to cover for possible UTI and obtain urine culture for antibiotic susceptibility or bacterial growth.  I will provide 1 dose of Keflex here in ED and patient can have second antibiotic at facility.  I discussed with patient's family and friends at bedside that antibiotic may be changed due to susceptibility or discontinue altogether if the urine culture does not grow bacteria.  Expressed understanding and agreed with plan.  After consideration of the diagnostic results and the patients response to treatment, I feel that the patent would benefit from outpatient management.  I discussed findings, disposition, return to Emergency Department precautions with patient expresses understanding and agrees with plan.  Discussed that she can request Tylenol or ibuprofen as needed for pain.  Return to emergency department precautions to include but not limited to syncope, seizures, lethargy, chest pain, shortness of breath.  Dr. Wilkie Aye was present during PE and agrees with plan       Final Clinical Impression(s) / ED Diagnoses Final diagnoses:  Fall, initial encounter    Rx / DC Orders ED Discharge Orders          Ordered    cephALEXin (KEFLEX) 500 MG capsule  2 times daily        09/11/23 1211              Judithann Sheen, PA 09/11/23 1221    Shon Baton, MD 09/13/23 [phone]

## 2023-09-11 NOTE — ED Notes (Signed)
Attempted to call [facility] x3

## 2023-09-11 NOTE — ED Notes (Signed)
CALLED [facility] FOR PT  PICK

## 2023-09-11 NOTE — ED Notes (Signed)
Patient discharged to [facility]. Unsuccessful attempt for report to facility x3, transported via [facility].

## 2023-09-11 NOTE — ED Triage Notes (Signed)
Patient BIB [facility] from facility. Had unwitnessed fall around 430. Staff placed patient back in bed. Denies neck pain. Tenderness to back of head per staff. No other obvious injuries. C-collar placed by staff.  158/72 54 HR 95% SpO2 RA Cbg 135

## 2023-09-11 NOTE — ED Notes (Signed)
Patient transported to CT 

## 2023-09-11 NOTE — Discharge Instructions (Addendum)
Thank for letting us take care of you today.  Your imaging was negative for fractures or hemorrhage in chest, neck, and head.  Your chest x-ray is negative for pneumonia or fluid.  Your lab work was unremarkable and per your baseline not requiring any intervention at this time.  You may take Tylenol or ibuprofen as needed for pain.  Her urine was significant for some leukocytes, WBC, and some bacteria.with her age and occasional confusion, we will cover for UTI but I have a urine culture pending.  I have printed a prescription for Keflex for you to pick up at your pharmacy.  As you received 1 dose of Keflex here, you can start this tonight as it is twice a day for 10 days.  Do not miss a dose and take 1 in the morning and 1 at nighttime.  You may check her MyChart to see results of urinary culture for antibiotic susceptibility or if there is no bacterial growth not requiring antibiotic.  Please consult Dr. Melynda Keller Provider at facility regarding urine culture results and if antibiotic should be continued or changed.  Please return to emergency department if you experience seizures, syncope, another fall, visual disturbances

## 2023-09-12 LAB — URINE CULTURE: Culture: >=100000 — AB

## 2023-09-13 LAB — URINE CULTURE: Culture: 50000 — AB

## 2023-09-13 NOTE — Telephone Encounter (Signed)
Post ED Visit - Positive Culture Follow-up  Culture report reviewed by antimicrobial stewardship pharmacist: [facility] [x]  Enos Fling, Pharm.D. []  Celedonio Miyamoto, Pharm.D., BCPS AQ-ID []  Garvin Fila, Pharm.D., BCPS []  Georgina Pillion, Pharm.D., BCPS []  [place], [address].D., BCPS, AAHIVP []  Estella Husk, Pharm.D., BCPS, AAHIVP []  Lysle Pearl, PharmD, BCPS []  Phillips Climes, PharmD, BCPS []  Agapito Games, PharmD, BCPS []  Verlan Friends, PharmD []  Mervyn Gay, PharmD, BCPS []  Vinnie Level, PharmD  [facility] []  Len Childs, PharmD []  Greer Pickerel, PharmD []  Adalberto Cole, PharmD []  Perlie Gold, Rph []  Lonell Face) Jean Rosenthal, PharmD []  Earl Many, PharmD []  Junita Push, PharmD []  Dorna Leitz, PharmD []  Terrilee Files, PharmD []  Lynann Beaver, PharmD []  Keturah Barre, PharmD []  Loralee Pacas, PharmD []  Bernadene Person, PharmD   Positive urine culture Treated with cephalexin, organism sensitive to the same and no further patient follow-up is required at this time.  [name] 09/13/2023, 11:24 AM

## 2023-09-16 DIAGNOSIS — M6259 Muscle wasting and atrophy, not elsewhere classified, multiple sites: Secondary | ICD-10-CM | POA: Diagnosis not present

## 2023-09-16 DIAGNOSIS — L03115 Cellulitis of right lower limb: Secondary | ICD-10-CM | POA: Diagnosis not present

## 2023-09-16 DIAGNOSIS — R2681 Unsteadiness on feet: Secondary | ICD-10-CM | POA: Diagnosis not present

## 2023-09-17 DIAGNOSIS — M6259 Muscle wasting and atrophy, not elsewhere classified, multiple sites: Secondary | ICD-10-CM | POA: Diagnosis not present

## 2023-09-17 DIAGNOSIS — L03115 Cellulitis of right lower limb: Secondary | ICD-10-CM | POA: Diagnosis not present

## 2023-09-17 DIAGNOSIS — R2681 Unsteadiness on feet: Secondary | ICD-10-CM | POA: Diagnosis not present

## 2023-09-18 ENCOUNTER — Encounter: Payer: Self-pay | Admitting: Obstetrics and Gynecology

## 2023-09-19 ENCOUNTER — Telehealth: Payer: Self-pay | Admitting: *Deleted

## 2023-09-19 DIAGNOSIS — R2681 Unsteadiness on feet: Secondary | ICD-10-CM | POA: Diagnosis not present

## 2023-09-19 DIAGNOSIS — F03A0 Unspecified dementia, mild, without behavioral disturbance, psychotic disturbance, mood disturbance, and anxiety: Secondary | ICD-10-CM | POA: Diagnosis not present

## 2023-09-19 DIAGNOSIS — Z23 Encounter for immunization: Secondary | ICD-10-CM | POA: Diagnosis not present

## 2023-09-19 DIAGNOSIS — I4891 Unspecified atrial fibrillation: Secondary | ICD-10-CM | POA: Diagnosis not present

## 2023-09-19 DIAGNOSIS — M6259 Muscle wasting and atrophy, not elsewhere classified, multiple sites: Secondary | ICD-10-CM | POA: Diagnosis not present

## 2023-09-19 DIAGNOSIS — Z7185 Encounter for immunization safety counseling: Secondary | ICD-10-CM | POA: Diagnosis not present

## 2023-09-19 DIAGNOSIS — L03115 Cellulitis of right lower limb: Secondary | ICD-10-CM | POA: Diagnosis not present

## 2023-09-19 DIAGNOSIS — E039 Hypothyroidism, unspecified: Secondary | ICD-10-CM | POA: Diagnosis not present

## 2023-09-19 NOTE — Progress Notes (Unsigned)
  Care Coordination  Outreach Note  09/19/2023 Name: Mallory Estrada MRN: [id number] DOB: 1941-08-11   Care Coordination Outreach Attempts: An unsuccessful telephone outreach was attempted today to offer the patient information about available care coordination services.  Follow Up Plan:  Additional outreach attempts will be made to offer the patient care coordination information and services.   Encounter Outcome:  No Answer  Burman Nieves, CCMA Care Coordination Care Guide Direct Dial: [phone]

## 2023-09-20 DIAGNOSIS — I4891 Unspecified atrial fibrillation: Secondary | ICD-10-CM | POA: Diagnosis not present

## 2023-09-20 DIAGNOSIS — F03A0 Unspecified dementia, mild, without behavioral disturbance, psychotic disturbance, mood disturbance, and anxiety: Secondary | ICD-10-CM | POA: Diagnosis not present

## 2023-09-20 DIAGNOSIS — L03115 Cellulitis of right lower limb: Secondary | ICD-10-CM | POA: Diagnosis not present

## 2023-09-20 DIAGNOSIS — E039 Hypothyroidism, unspecified: Secondary | ICD-10-CM | POA: Diagnosis not present

## 2023-09-20 NOTE — Progress Notes (Signed)
  Care Coordination   Note   09/20/2023 Name: [name] MRN: [id number] DOB: [birth year]  [name] is a 82 y.o. year old female who sees Eloisa Northern, MD for primary care. I reached out to Myra Gianotti by phone today to offer care coordination services.  [name] was given information about Care Coordination services today including:   The Care Coordination services include support from the care team which includes your Nurse Coordinator, Clinical Social Worker, or Pharmacist.  The Care Coordination team is here to help remove barriers to the health concerns and goals most important to you. Care Coordination services are voluntary, and the patient may decline or stop services at any time by request to their care team member.   Care Coordination Consent Status: Patient did not agree to participate in care coordination services at this time.  Follow up plan:  per pt daughter pt in SNF and has resources for counseling - appreciative and contact info given if services needed in the future     Encounter Outcome:  Patient Refused  Burman Nieves, [facility] Care Coordination Care Guide Direct Dial: [phone]

## 2023-09-21 ENCOUNTER — Ambulatory Visit: Payer: Medicare Other | Admitting: Obstetrics and Gynecology

## 2023-09-21 DIAGNOSIS — F03A0 Unspecified dementia, mild, without behavioral disturbance, psychotic disturbance, mood disturbance, and anxiety: Secondary | ICD-10-CM | POA: Diagnosis not present

## 2023-09-21 DIAGNOSIS — E039 Hypothyroidism, unspecified: Secondary | ICD-10-CM | POA: Diagnosis not present

## 2023-09-21 DIAGNOSIS — I4891 Unspecified atrial fibrillation: Secondary | ICD-10-CM | POA: Diagnosis not present

## 2023-09-21 DIAGNOSIS — L03115 Cellulitis of right lower limb: Secondary | ICD-10-CM | POA: Diagnosis not present

## 2023-09-23 DIAGNOSIS — F03A0 Unspecified dementia, mild, without behavioral disturbance, psychotic disturbance, mood disturbance, and anxiety: Secondary | ICD-10-CM | POA: Diagnosis not present

## 2023-09-23 DIAGNOSIS — M6259 Muscle wasting and atrophy, not elsewhere classified, multiple sites: Secondary | ICD-10-CM | POA: Diagnosis not present

## 2023-09-23 DIAGNOSIS — R2681 Unsteadiness on feet: Secondary | ICD-10-CM | POA: Diagnosis not present

## 2023-09-23 DIAGNOSIS — L03115 Cellulitis of right lower limb: Secondary | ICD-10-CM | POA: Diagnosis not present

## 2023-09-23 DIAGNOSIS — I4891 Unspecified atrial fibrillation: Secondary | ICD-10-CM | POA: Diagnosis not present

## 2023-09-23 DIAGNOSIS — E039 Hypothyroidism, unspecified: Secondary | ICD-10-CM | POA: Diagnosis not present

## 2023-09-26 DIAGNOSIS — M6259 Muscle wasting and atrophy, not elsewhere classified, multiple sites: Secondary | ICD-10-CM | POA: Diagnosis not present

## 2023-09-26 DIAGNOSIS — R2681 Unsteadiness on feet: Secondary | ICD-10-CM | POA: Diagnosis not present

## 2023-09-26 DIAGNOSIS — I4891 Unspecified atrial fibrillation: Secondary | ICD-10-CM | POA: Diagnosis not present

## 2023-09-26 DIAGNOSIS — E039 Hypothyroidism, unspecified: Secondary | ICD-10-CM | POA: Diagnosis not present

## 2023-09-26 DIAGNOSIS — F03A0 Unspecified dementia, mild, without behavioral disturbance, psychotic disturbance, mood disturbance, and anxiety: Secondary | ICD-10-CM | POA: Diagnosis not present

## 2023-09-26 DIAGNOSIS — L03115 Cellulitis of right lower limb: Secondary | ICD-10-CM | POA: Diagnosis not present

## 2023-09-27 DIAGNOSIS — M1611 Unilateral primary osteoarthritis, right hip: Secondary | ICD-10-CM | POA: Diagnosis not present

## 2023-09-27 DIAGNOSIS — M6259 Muscle wasting and atrophy, not elsewhere classified, multiple sites: Secondary | ICD-10-CM | POA: Diagnosis not present

## 2023-09-27 DIAGNOSIS — L03115 Cellulitis of right lower limb: Secondary | ICD-10-CM | POA: Diagnosis not present

## 2023-09-27 DIAGNOSIS — E038 Other specified hypothyroidism: Secondary | ICD-10-CM | POA: Diagnosis not present

## 2023-09-27 DIAGNOSIS — R2681 Unsteadiness on feet: Secondary | ICD-10-CM | POA: Diagnosis not present

## 2023-09-27 DIAGNOSIS — I4891 Unspecified atrial fibrillation: Secondary | ICD-10-CM | POA: Diagnosis not present

## 2023-09-27 DIAGNOSIS — F03A0 Unspecified dementia, mild, without behavioral disturbance, psychotic disturbance, mood disturbance, and anxiety: Secondary | ICD-10-CM | POA: Diagnosis not present

## 2023-09-27 DIAGNOSIS — E039 Hypothyroidism, unspecified: Secondary | ICD-10-CM | POA: Diagnosis not present

## 2023-09-28 DIAGNOSIS — L03115 Cellulitis of right lower limb: Secondary | ICD-10-CM | POA: Diagnosis not present

## 2023-09-28 DIAGNOSIS — R7989 Other specified abnormal findings of blood chemistry: Secondary | ICD-10-CM | POA: Diagnosis not present

## 2023-09-28 DIAGNOSIS — I4891 Unspecified atrial fibrillation: Secondary | ICD-10-CM | POA: Diagnosis not present

## 2023-09-28 DIAGNOSIS — F03A0 Unspecified dementia, mild, without behavioral disturbance, psychotic disturbance, mood disturbance, and anxiety: Secondary | ICD-10-CM | POA: Diagnosis not present

## 2023-09-28 DIAGNOSIS — E039 Hypothyroidism, unspecified: Secondary | ICD-10-CM | POA: Diagnosis not present

## 2023-10-03 DIAGNOSIS — M6259 Muscle wasting and atrophy, not elsewhere classified, multiple sites: Secondary | ICD-10-CM | POA: Diagnosis not present

## 2023-10-03 DIAGNOSIS — L03115 Cellulitis of right lower limb: Secondary | ICD-10-CM | POA: Diagnosis not present

## 2023-10-03 DIAGNOSIS — R2681 Unsteadiness on feet: Secondary | ICD-10-CM | POA: Diagnosis not present

## 2023-10-03 DIAGNOSIS — R1312 Dysphagia, oropharyngeal phase: Secondary | ICD-10-CM | POA: Diagnosis not present

## 2023-10-03 DIAGNOSIS — R41841 Cognitive communication deficit: Secondary | ICD-10-CM | POA: Diagnosis not present

## 2023-10-04 DIAGNOSIS — R1312 Dysphagia, oropharyngeal phase: Secondary | ICD-10-CM | POA: Diagnosis not present

## 2023-10-04 DIAGNOSIS — R2681 Unsteadiness on feet: Secondary | ICD-10-CM | POA: Diagnosis not present

## 2023-10-04 DIAGNOSIS — M6259 Muscle wasting and atrophy, not elsewhere classified, multiple sites: Secondary | ICD-10-CM | POA: Diagnosis not present

## 2023-10-04 DIAGNOSIS — R41841 Cognitive communication deficit: Secondary | ICD-10-CM | POA: Diagnosis not present

## 2023-10-04 DIAGNOSIS — L03115 Cellulitis of right lower limb: Secondary | ICD-10-CM | POA: Diagnosis not present

## 2023-10-05 DIAGNOSIS — R41841 Cognitive communication deficit: Secondary | ICD-10-CM | POA: Diagnosis not present

## 2023-10-05 DIAGNOSIS — R1312 Dysphagia, oropharyngeal phase: Secondary | ICD-10-CM | POA: Diagnosis not present

## 2023-10-05 DIAGNOSIS — R2681 Unsteadiness on feet: Secondary | ICD-10-CM | POA: Diagnosis not present

## 2023-10-05 DIAGNOSIS — L03115 Cellulitis of right lower limb: Secondary | ICD-10-CM | POA: Diagnosis not present

## 2023-10-05 DIAGNOSIS — M6259 Muscle wasting and atrophy, not elsewhere classified, multiple sites: Secondary | ICD-10-CM | POA: Diagnosis not present

## 2023-10-07 DIAGNOSIS — E039 Hypothyroidism, unspecified: Secondary | ICD-10-CM | POA: Diagnosis not present

## 2023-10-07 DIAGNOSIS — M6259 Muscle wasting and atrophy, not elsewhere classified, multiple sites: Secondary | ICD-10-CM | POA: Diagnosis not present

## 2023-10-07 DIAGNOSIS — R2681 Unsteadiness on feet: Secondary | ICD-10-CM | POA: Diagnosis not present

## 2023-10-07 DIAGNOSIS — F03A0 Unspecified dementia, mild, without behavioral disturbance, psychotic disturbance, mood disturbance, and anxiety: Secondary | ICD-10-CM | POA: Diagnosis not present

## 2023-10-07 DIAGNOSIS — R41841 Cognitive communication deficit: Secondary | ICD-10-CM | POA: Diagnosis not present

## 2023-10-07 DIAGNOSIS — I4891 Unspecified atrial fibrillation: Secondary | ICD-10-CM | POA: Diagnosis not present

## 2023-10-07 DIAGNOSIS — L03115 Cellulitis of right lower limb: Secondary | ICD-10-CM | POA: Diagnosis not present

## 2023-10-07 DIAGNOSIS — R1312 Dysphagia, oropharyngeal phase: Secondary | ICD-10-CM | POA: Diagnosis not present

## 2023-10-09 DIAGNOSIS — M6259 Muscle wasting and atrophy, not elsewhere classified, multiple sites: Secondary | ICD-10-CM | POA: Diagnosis not present

## 2023-10-09 DIAGNOSIS — L03115 Cellulitis of right lower limb: Secondary | ICD-10-CM | POA: Diagnosis not present

## 2023-10-09 DIAGNOSIS — R41841 Cognitive communication deficit: Secondary | ICD-10-CM | POA: Diagnosis not present

## 2023-10-09 DIAGNOSIS — R1312 Dysphagia, oropharyngeal phase: Secondary | ICD-10-CM | POA: Diagnosis not present

## 2023-10-09 DIAGNOSIS — R2681 Unsteadiness on feet: Secondary | ICD-10-CM | POA: Diagnosis not present

## 2023-10-11 DIAGNOSIS — R2681 Unsteadiness on feet: Secondary | ICD-10-CM | POA: Diagnosis not present

## 2023-10-11 DIAGNOSIS — M6259 Muscle wasting and atrophy, not elsewhere classified, multiple sites: Secondary | ICD-10-CM | POA: Diagnosis not present

## 2023-10-11 DIAGNOSIS — R1312 Dysphagia, oropharyngeal phase: Secondary | ICD-10-CM | POA: Diagnosis not present

## 2023-10-11 DIAGNOSIS — L03115 Cellulitis of right lower limb: Secondary | ICD-10-CM | POA: Diagnosis not present

## 2023-10-11 DIAGNOSIS — R41841 Cognitive communication deficit: Secondary | ICD-10-CM | POA: Diagnosis not present

## 2023-10-12 DIAGNOSIS — R2681 Unsteadiness on feet: Secondary | ICD-10-CM | POA: Diagnosis not present

## 2023-10-12 DIAGNOSIS — M6259 Muscle wasting and atrophy, not elsewhere classified, multiple sites: Secondary | ICD-10-CM | POA: Diagnosis not present

## 2023-10-12 DIAGNOSIS — R41841 Cognitive communication deficit: Secondary | ICD-10-CM | POA: Diagnosis not present

## 2023-10-12 DIAGNOSIS — Z515 Encounter for palliative care: Secondary | ICD-10-CM | POA: Diagnosis not present

## 2023-10-12 DIAGNOSIS — L03115 Cellulitis of right lower limb: Secondary | ICD-10-CM | POA: Diagnosis not present

## 2023-10-12 DIAGNOSIS — R1312 Dysphagia, oropharyngeal phase: Secondary | ICD-10-CM | POA: Diagnosis not present

## 2023-10-13 DIAGNOSIS — M6259 Muscle wasting and atrophy, not elsewhere classified, multiple sites: Secondary | ICD-10-CM | POA: Diagnosis not present

## 2023-10-13 DIAGNOSIS — E039 Hypothyroidism, unspecified: Secondary | ICD-10-CM | POA: Diagnosis not present

## 2023-10-13 DIAGNOSIS — L03115 Cellulitis of right lower limb: Secondary | ICD-10-CM | POA: Diagnosis not present

## 2023-10-13 DIAGNOSIS — F03A0 Unspecified dementia, mild, without behavioral disturbance, psychotic disturbance, mood disturbance, and anxiety: Secondary | ICD-10-CM | POA: Diagnosis not present

## 2023-10-13 DIAGNOSIS — M6281 Muscle weakness (generalized): Secondary | ICD-10-CM | POA: Diagnosis not present

## 2023-10-13 DIAGNOSIS — R41841 Cognitive communication deficit: Secondary | ICD-10-CM | POA: Diagnosis not present

## 2023-10-13 DIAGNOSIS — I4891 Unspecified atrial fibrillation: Secondary | ICD-10-CM | POA: Diagnosis not present

## 2023-10-13 DIAGNOSIS — R1312 Dysphagia, oropharyngeal phase: Secondary | ICD-10-CM | POA: Diagnosis not present

## 2023-10-13 DIAGNOSIS — R2681 Unsteadiness on feet: Secondary | ICD-10-CM | POA: Diagnosis not present

## 2023-10-14 DIAGNOSIS — R41841 Cognitive communication deficit: Secondary | ICD-10-CM | POA: Diagnosis not present

## 2023-10-14 DIAGNOSIS — R2681 Unsteadiness on feet: Secondary | ICD-10-CM | POA: Diagnosis not present

## 2023-10-14 DIAGNOSIS — L03115 Cellulitis of right lower limb: Secondary | ICD-10-CM | POA: Diagnosis not present

## 2023-10-14 DIAGNOSIS — R1312 Dysphagia, oropharyngeal phase: Secondary | ICD-10-CM | POA: Diagnosis not present

## 2023-10-14 DIAGNOSIS — M6259 Muscle wasting and atrophy, not elsewhere classified, multiple sites: Secondary | ICD-10-CM | POA: Diagnosis not present

## 2023-10-17 DIAGNOSIS — L03115 Cellulitis of right lower limb: Secondary | ICD-10-CM | POA: Diagnosis not present

## 2023-10-17 DIAGNOSIS — I4891 Unspecified atrial fibrillation: Secondary | ICD-10-CM | POA: Diagnosis not present

## 2023-10-17 DIAGNOSIS — M6281 Muscle weakness (generalized): Secondary | ICD-10-CM | POA: Diagnosis not present

## 2023-10-17 DIAGNOSIS — F03A0 Unspecified dementia, mild, without behavioral disturbance, psychotic disturbance, mood disturbance, and anxiety: Secondary | ICD-10-CM | POA: Diagnosis not present

## 2023-10-18 DIAGNOSIS — R2681 Unsteadiness on feet: Secondary | ICD-10-CM | POA: Diagnosis not present

## 2023-10-18 DIAGNOSIS — R41841 Cognitive communication deficit: Secondary | ICD-10-CM | POA: Diagnosis not present

## 2023-10-18 DIAGNOSIS — R1312 Dysphagia, oropharyngeal phase: Secondary | ICD-10-CM | POA: Diagnosis not present

## 2023-10-18 DIAGNOSIS — L03115 Cellulitis of right lower limb: Secondary | ICD-10-CM | POA: Diagnosis not present

## 2023-10-18 DIAGNOSIS — M6259 Muscle wasting and atrophy, not elsewhere classified, multiple sites: Secondary | ICD-10-CM | POA: Diagnosis not present

## 2023-10-19 DIAGNOSIS — M6259 Muscle wasting and atrophy, not elsewhere classified, multiple sites: Secondary | ICD-10-CM | POA: Diagnosis not present

## 2023-10-19 DIAGNOSIS — R41841 Cognitive communication deficit: Secondary | ICD-10-CM | POA: Diagnosis not present

## 2023-10-19 DIAGNOSIS — I10 Essential (primary) hypertension: Secondary | ICD-10-CM | POA: Diagnosis not present

## 2023-10-19 DIAGNOSIS — R2681 Unsteadiness on feet: Secondary | ICD-10-CM | POA: Diagnosis not present

## 2023-10-19 DIAGNOSIS — R1312 Dysphagia, oropharyngeal phase: Secondary | ICD-10-CM | POA: Diagnosis not present

## 2023-10-19 DIAGNOSIS — L03115 Cellulitis of right lower limb: Secondary | ICD-10-CM | POA: Diagnosis not present

## 2023-10-20 DIAGNOSIS — R41841 Cognitive communication deficit: Secondary | ICD-10-CM | POA: Diagnosis not present

## 2023-10-20 DIAGNOSIS — M6259 Muscle wasting and atrophy, not elsewhere classified, multiple sites: Secondary | ICD-10-CM | POA: Diagnosis not present

## 2023-10-20 DIAGNOSIS — L03115 Cellulitis of right lower limb: Secondary | ICD-10-CM | POA: Diagnosis not present

## 2023-10-20 DIAGNOSIS — R2681 Unsteadiness on feet: Secondary | ICD-10-CM | POA: Diagnosis not present

## 2023-10-20 DIAGNOSIS — R1312 Dysphagia, oropharyngeal phase: Secondary | ICD-10-CM | POA: Diagnosis not present

## 2023-10-21 DIAGNOSIS — M6259 Muscle wasting and atrophy, not elsewhere classified, multiple sites: Secondary | ICD-10-CM | POA: Diagnosis not present

## 2023-10-21 DIAGNOSIS — L03115 Cellulitis of right lower limb: Secondary | ICD-10-CM | POA: Diagnosis not present

## 2023-10-21 DIAGNOSIS — R41841 Cognitive communication deficit: Secondary | ICD-10-CM | POA: Diagnosis not present

## 2023-10-21 DIAGNOSIS — R1312 Dysphagia, oropharyngeal phase: Secondary | ICD-10-CM | POA: Diagnosis not present

## 2023-10-21 DIAGNOSIS — R2681 Unsteadiness on feet: Secondary | ICD-10-CM | POA: Diagnosis not present

## 2023-10-22 DIAGNOSIS — R1312 Dysphagia, oropharyngeal phase: Secondary | ICD-10-CM | POA: Diagnosis not present

## 2023-10-22 DIAGNOSIS — R41841 Cognitive communication deficit: Secondary | ICD-10-CM | POA: Diagnosis not present

## 2023-10-22 DIAGNOSIS — L03115 Cellulitis of right lower limb: Secondary | ICD-10-CM | POA: Diagnosis not present

## 2023-10-22 DIAGNOSIS — R2681 Unsteadiness on feet: Secondary | ICD-10-CM | POA: Diagnosis not present

## 2023-10-22 DIAGNOSIS — M6259 Muscle wasting and atrophy, not elsewhere classified, multiple sites: Secondary | ICD-10-CM | POA: Diagnosis not present

## 2023-10-24 DIAGNOSIS — R2681 Unsteadiness on feet: Secondary | ICD-10-CM | POA: Diagnosis not present

## 2023-10-24 DIAGNOSIS — M6259 Muscle wasting and atrophy, not elsewhere classified, multiple sites: Secondary | ICD-10-CM | POA: Diagnosis not present

## 2023-10-24 DIAGNOSIS — L03115 Cellulitis of right lower limb: Secondary | ICD-10-CM | POA: Diagnosis not present

## 2023-10-24 DIAGNOSIS — R41841 Cognitive communication deficit: Secondary | ICD-10-CM | POA: Diagnosis not present

## 2023-10-24 DIAGNOSIS — R1312 Dysphagia, oropharyngeal phase: Secondary | ICD-10-CM | POA: Diagnosis not present

## 2023-10-25 DIAGNOSIS — M6259 Muscle wasting and atrophy, not elsewhere classified, multiple sites: Secondary | ICD-10-CM | POA: Diagnosis not present

## 2023-10-25 DIAGNOSIS — L03115 Cellulitis of right lower limb: Secondary | ICD-10-CM | POA: Diagnosis not present

## 2023-10-25 DIAGNOSIS — R41841 Cognitive communication deficit: Secondary | ICD-10-CM | POA: Diagnosis not present

## 2023-10-25 DIAGNOSIS — R1312 Dysphagia, oropharyngeal phase: Secondary | ICD-10-CM | POA: Diagnosis not present

## 2023-10-25 DIAGNOSIS — R2681 Unsteadiness on feet: Secondary | ICD-10-CM | POA: Diagnosis not present

## 2023-10-27 DIAGNOSIS — M6259 Muscle wasting and atrophy, not elsewhere classified, multiple sites: Secondary | ICD-10-CM | POA: Diagnosis not present

## 2023-10-27 DIAGNOSIS — R41841 Cognitive communication deficit: Secondary | ICD-10-CM | POA: Diagnosis not present

## 2023-10-27 DIAGNOSIS — R2681 Unsteadiness on feet: Secondary | ICD-10-CM | POA: Diagnosis not present

## 2023-10-27 DIAGNOSIS — L03115 Cellulitis of right lower limb: Secondary | ICD-10-CM | POA: Diagnosis not present

## 2023-10-27 DIAGNOSIS — R1312 Dysphagia, oropharyngeal phase: Secondary | ICD-10-CM | POA: Diagnosis not present

## 2023-10-28 DIAGNOSIS — M1611 Unilateral primary osteoarthritis, right hip: Secondary | ICD-10-CM | POA: Diagnosis not present

## 2023-10-28 DIAGNOSIS — R1312 Dysphagia, oropharyngeal phase: Secondary | ICD-10-CM | POA: Diagnosis not present

## 2023-10-28 DIAGNOSIS — R2681 Unsteadiness on feet: Secondary | ICD-10-CM | POA: Diagnosis not present

## 2023-10-28 DIAGNOSIS — R41841 Cognitive communication deficit: Secondary | ICD-10-CM | POA: Diagnosis not present

## 2023-10-28 DIAGNOSIS — E038 Other specified hypothyroidism: Secondary | ICD-10-CM | POA: Diagnosis not present

## 2023-10-28 DIAGNOSIS — M6259 Muscle wasting and atrophy, not elsewhere classified, multiple sites: Secondary | ICD-10-CM | POA: Diagnosis not present

## 2023-10-28 DIAGNOSIS — L03115 Cellulitis of right lower limb: Secondary | ICD-10-CM | POA: Diagnosis not present

## 2023-10-31 DIAGNOSIS — Z515 Encounter for palliative care: Secondary | ICD-10-CM | POA: Diagnosis not present

## 2023-10-31 DIAGNOSIS — L03115 Cellulitis of right lower limb: Secondary | ICD-10-CM | POA: Diagnosis not present

## 2023-10-31 DIAGNOSIS — R2681 Unsteadiness on feet: Secondary | ICD-10-CM | POA: Diagnosis not present

## 2023-10-31 DIAGNOSIS — R1312 Dysphagia, oropharyngeal phase: Secondary | ICD-10-CM | POA: Diagnosis not present

## 2023-10-31 DIAGNOSIS — R41841 Cognitive communication deficit: Secondary | ICD-10-CM | POA: Diagnosis not present

## 2023-10-31 DIAGNOSIS — M6259 Muscle wasting and atrophy, not elsewhere classified, multiple sites: Secondary | ICD-10-CM | POA: Diagnosis not present

## 2023-11-01 DIAGNOSIS — M6259 Muscle wasting and atrophy, not elsewhere classified, multiple sites: Secondary | ICD-10-CM | POA: Diagnosis not present

## 2023-11-01 DIAGNOSIS — R2681 Unsteadiness on feet: Secondary | ICD-10-CM | POA: Diagnosis not present

## 2023-11-01 DIAGNOSIS — L03115 Cellulitis of right lower limb: Secondary | ICD-10-CM | POA: Diagnosis not present

## 2023-11-01 DIAGNOSIS — R41841 Cognitive communication deficit: Secondary | ICD-10-CM | POA: Diagnosis not present

## 2023-11-01 DIAGNOSIS — R1312 Dysphagia, oropharyngeal phase: Secondary | ICD-10-CM | POA: Diagnosis not present

## 2023-11-02 DIAGNOSIS — L03115 Cellulitis of right lower limb: Secondary | ICD-10-CM | POA: Diagnosis not present

## 2023-11-02 DIAGNOSIS — R1312 Dysphagia, oropharyngeal phase: Secondary | ICD-10-CM | POA: Diagnosis not present

## 2023-11-02 DIAGNOSIS — M6259 Muscle wasting and atrophy, not elsewhere classified, multiple sites: Secondary | ICD-10-CM | POA: Diagnosis not present

## 2023-11-02 DIAGNOSIS — R41841 Cognitive communication deficit: Secondary | ICD-10-CM | POA: Diagnosis not present

## 2023-11-02 DIAGNOSIS — R2681 Unsteadiness on feet: Secondary | ICD-10-CM | POA: Diagnosis not present

## 2023-11-03 DIAGNOSIS — R41841 Cognitive communication deficit: Secondary | ICD-10-CM | POA: Diagnosis not present

## 2023-11-03 DIAGNOSIS — R1312 Dysphagia, oropharyngeal phase: Secondary | ICD-10-CM | POA: Diagnosis not present

## 2023-11-03 DIAGNOSIS — R2681 Unsteadiness on feet: Secondary | ICD-10-CM | POA: Diagnosis not present

## 2023-11-03 DIAGNOSIS — M6259 Muscle wasting and atrophy, not elsewhere classified, multiple sites: Secondary | ICD-10-CM | POA: Diagnosis not present

## 2023-11-03 DIAGNOSIS — L03115 Cellulitis of right lower limb: Secondary | ICD-10-CM | POA: Diagnosis not present

## 2023-11-06 DIAGNOSIS — R2681 Unsteadiness on feet: Secondary | ICD-10-CM | POA: Diagnosis not present

## 2023-11-06 DIAGNOSIS — M6259 Muscle wasting and atrophy, not elsewhere classified, multiple sites: Secondary | ICD-10-CM | POA: Diagnosis not present

## 2023-11-06 DIAGNOSIS — R41841 Cognitive communication deficit: Secondary | ICD-10-CM | POA: Diagnosis not present

## 2023-11-06 DIAGNOSIS — R1312 Dysphagia, oropharyngeal phase: Secondary | ICD-10-CM | POA: Diagnosis not present

## 2023-11-06 DIAGNOSIS — L03115 Cellulitis of right lower limb: Secondary | ICD-10-CM | POA: Diagnosis not present

## 2023-11-07 DIAGNOSIS — R2681 Unsteadiness on feet: Secondary | ICD-10-CM | POA: Diagnosis not present

## 2023-11-07 DIAGNOSIS — M6259 Muscle wasting and atrophy, not elsewhere classified, multiple sites: Secondary | ICD-10-CM | POA: Diagnosis not present

## 2023-11-07 DIAGNOSIS — R1312 Dysphagia, oropharyngeal phase: Secondary | ICD-10-CM | POA: Diagnosis not present

## 2023-11-07 DIAGNOSIS — L03115 Cellulitis of right lower limb: Secondary | ICD-10-CM | POA: Diagnosis not present

## 2023-11-07 DIAGNOSIS — R41841 Cognitive communication deficit: Secondary | ICD-10-CM | POA: Diagnosis not present

## 2023-11-07 DIAGNOSIS — N390 Urinary tract infection, site not specified: Secondary | ICD-10-CM | POA: Diagnosis not present

## 2023-11-09 DIAGNOSIS — R2681 Unsteadiness on feet: Secondary | ICD-10-CM | POA: Diagnosis not present

## 2023-11-09 DIAGNOSIS — L03115 Cellulitis of right lower limb: Secondary | ICD-10-CM | POA: Diagnosis not present

## 2023-11-09 DIAGNOSIS — M6259 Muscle wasting and atrophy, not elsewhere classified, multiple sites: Secondary | ICD-10-CM | POA: Diagnosis not present

## 2023-11-09 DIAGNOSIS — R1312 Dysphagia, oropharyngeal phase: Secondary | ICD-10-CM | POA: Diagnosis not present

## 2023-11-09 DIAGNOSIS — R41841 Cognitive communication deficit: Secondary | ICD-10-CM | POA: Diagnosis not present

## 2023-11-15 DIAGNOSIS — R1310 Dysphagia, unspecified: Secondary | ICD-10-CM | POA: Diagnosis not present

## 2023-11-15 DIAGNOSIS — F03918 Unspecified dementia, unspecified severity, with other behavioral disturbance: Secondary | ICD-10-CM | POA: Diagnosis not present

## 2023-11-15 DIAGNOSIS — I4891 Unspecified atrial fibrillation: Secondary | ICD-10-CM | POA: Diagnosis not present

## 2023-11-15 DIAGNOSIS — E039 Hypothyroidism, unspecified: Secondary | ICD-10-CM | POA: Diagnosis not present

## 2023-11-22 DIAGNOSIS — M169 Osteoarthritis of hip, unspecified: Secondary | ICD-10-CM | POA: Diagnosis not present

## 2023-11-22 DIAGNOSIS — F0390 Unspecified dementia without behavioral disturbance: Secondary | ICD-10-CM | POA: Diagnosis not present

## 2023-11-24 DIAGNOSIS — I4891 Unspecified atrial fibrillation: Secondary | ICD-10-CM | POA: Diagnosis not present

## 2023-11-24 DIAGNOSIS — E039 Hypothyroidism, unspecified: Secondary | ICD-10-CM | POA: Diagnosis not present

## 2023-11-24 DIAGNOSIS — R1310 Dysphagia, unspecified: Secondary | ICD-10-CM | POA: Diagnosis not present

## 2023-11-24 DIAGNOSIS — F03918 Unspecified dementia, unspecified severity, with other behavioral disturbance: Secondary | ICD-10-CM | POA: Diagnosis not present

## 2023-12-06 DIAGNOSIS — Z515 Encounter for palliative care: Secondary | ICD-10-CM | POA: Diagnosis not present

## 2023-12-18 ENCOUNTER — Encounter: Payer: Self-pay | Admitting: Physician Assistant

## 2023-12-21 DIAGNOSIS — E039 Hypothyroidism, unspecified: Secondary | ICD-10-CM | POA: Diagnosis not present

## 2023-12-21 DIAGNOSIS — F03918 Unspecified dementia, unspecified severity, with other behavioral disturbance: Secondary | ICD-10-CM | POA: Diagnosis not present

## 2023-12-21 DIAGNOSIS — F411 Generalized anxiety disorder: Secondary | ICD-10-CM | POA: Diagnosis not present

## 2023-12-21 DIAGNOSIS — I4891 Unspecified atrial fibrillation: Secondary | ICD-10-CM | POA: Diagnosis not present

## 2023-12-29 DIAGNOSIS — I4891 Unspecified atrial fibrillation: Secondary | ICD-10-CM | POA: Diagnosis not present

## 2023-12-29 DIAGNOSIS — F03918 Unspecified dementia, unspecified severity, with other behavioral disturbance: Secondary | ICD-10-CM | POA: Diagnosis not present

## 2023-12-29 DIAGNOSIS — E039 Hypothyroidism, unspecified: Secondary | ICD-10-CM | POA: Diagnosis not present

## 2023-12-29 DIAGNOSIS — F330 Major depressive disorder, recurrent, mild: Secondary | ICD-10-CM | POA: Diagnosis not present

## 2024-01-02 DIAGNOSIS — R059 Cough, unspecified: Secondary | ICD-10-CM | POA: Diagnosis not present

## 2024-01-02 DIAGNOSIS — R0989 Other specified symptoms and signs involving the circulatory and respiratory systems: Secondary | ICD-10-CM | POA: Diagnosis not present

## 2024-01-06 DIAGNOSIS — D649 Anemia, unspecified: Secondary | ICD-10-CM | POA: Diagnosis not present

## 2024-01-07 DIAGNOSIS — R195 Other fecal abnormalities: Secondary | ICD-10-CM | POA: Diagnosis not present

## 2024-01-17 ENCOUNTER — Encounter: Payer: Self-pay | Admitting: Family Medicine

## 2024-01-19 DIAGNOSIS — I4891 Unspecified atrial fibrillation: Secondary | ICD-10-CM | POA: Diagnosis not present

## 2024-01-19 DIAGNOSIS — F330 Major depressive disorder, recurrent, mild: Secondary | ICD-10-CM | POA: Diagnosis not present

## 2024-01-19 DIAGNOSIS — F03918 Unspecified dementia, unspecified severity, with other behavioral disturbance: Secondary | ICD-10-CM | POA: Diagnosis not present

## 2024-01-19 DIAGNOSIS — E039 Hypothyroidism, unspecified: Secondary | ICD-10-CM | POA: Diagnosis not present

## 2024-01-20 DIAGNOSIS — Z515 Encounter for palliative care: Secondary | ICD-10-CM | POA: Diagnosis not present

## 2024-01-21 DIAGNOSIS — F03A0 Unspecified dementia, mild, without behavioral disturbance, psychotic disturbance, mood disturbance, and anxiety: Secondary | ICD-10-CM | POA: Diagnosis not present

## 2024-01-21 DIAGNOSIS — M6259 Muscle wasting and atrophy, not elsewhere classified, multiple sites: Secondary | ICD-10-CM | POA: Diagnosis not present

## 2024-01-21 DIAGNOSIS — R1312 Dysphagia, oropharyngeal phase: Secondary | ICD-10-CM | POA: Diagnosis not present

## 2024-01-21 DIAGNOSIS — R41841 Cognitive communication deficit: Secondary | ICD-10-CM | POA: Diagnosis not present

## 2024-01-21 DIAGNOSIS — E039 Hypothyroidism, unspecified: Secondary | ICD-10-CM | POA: Diagnosis not present

## 2024-01-24 DIAGNOSIS — R41841 Cognitive communication deficit: Secondary | ICD-10-CM | POA: Diagnosis not present

## 2024-01-24 DIAGNOSIS — E039 Hypothyroidism, unspecified: Secondary | ICD-10-CM | POA: Diagnosis not present

## 2024-01-24 DIAGNOSIS — R1312 Dysphagia, oropharyngeal phase: Secondary | ICD-10-CM | POA: Diagnosis not present

## 2024-01-24 DIAGNOSIS — F03A0 Unspecified dementia, mild, without behavioral disturbance, psychotic disturbance, mood disturbance, and anxiety: Secondary | ICD-10-CM | POA: Diagnosis not present

## 2024-01-24 DIAGNOSIS — M6259 Muscle wasting and atrophy, not elsewhere classified, multiple sites: Secondary | ICD-10-CM | POA: Diagnosis not present

## 2024-02-10 ENCOUNTER — Ambulatory Visit: Payer: Medicare Other | Admitting: Physician Assistant

## 2024-02-15 DIAGNOSIS — F03918 Unspecified dementia, unspecified severity, with other behavioral disturbance: Secondary | ICD-10-CM | POA: Diagnosis not present

## 2024-02-15 DIAGNOSIS — F330 Major depressive disorder, recurrent, mild: Secondary | ICD-10-CM | POA: Diagnosis not present

## 2024-02-15 DIAGNOSIS — I4891 Unspecified atrial fibrillation: Secondary | ICD-10-CM | POA: Diagnosis not present

## 2024-02-15 DIAGNOSIS — E039 Hypothyroidism, unspecified: Secondary | ICD-10-CM | POA: Diagnosis not present

## 2024-03-13 DIAGNOSIS — Z515 Encounter for palliative care: Secondary | ICD-10-CM | POA: Diagnosis not present

## 2024-03-13 DIAGNOSIS — E039 Hypothyroidism, unspecified: Secondary | ICD-10-CM | POA: Diagnosis not present

## 2024-03-13 DIAGNOSIS — I4891 Unspecified atrial fibrillation: Secondary | ICD-10-CM | POA: Diagnosis not present

## 2024-03-29 DIAGNOSIS — F411 Generalized anxiety disorder: Secondary | ICD-10-CM | POA: Diagnosis not present

## 2024-03-29 DIAGNOSIS — F03B0 Unspecified dementia, moderate, without behavioral disturbance, psychotic disturbance, mood disturbance, and anxiety: Secondary | ICD-10-CM | POA: Diagnosis not present

## 2024-03-29 DIAGNOSIS — F320 Major depressive disorder, single episode, mild: Secondary | ICD-10-CM | POA: Diagnosis not present

## 2024-04-03 DIAGNOSIS — F320 Major depressive disorder, single episode, mild: Secondary | ICD-10-CM | POA: Diagnosis not present

## 2024-04-03 DIAGNOSIS — F03B0 Unspecified dementia, moderate, without behavioral disturbance, psychotic disturbance, mood disturbance, and anxiety: Secondary | ICD-10-CM | POA: Diagnosis not present

## 2024-04-03 DIAGNOSIS — F411 Generalized anxiety disorder: Secondary | ICD-10-CM | POA: Diagnosis not present

## 2024-06-01 DEATH — deceased
